# Patient Record
Sex: Female | Born: 1935
Health system: Southern US, Community
[De-identification: ages and names within clinical notes are randomized; demographics above are authoritative.]

## PROBLEM LIST (undated history)

## (undated) DIAGNOSIS — C801 Malignant (primary) neoplasm, unspecified: Secondary | ICD-10-CM

## (undated) HISTORY — PX: ABDOMINAL HYSTERECTOMY: SHX81

## (undated) HISTORY — PX: CHOLECYSTECTOMY: SHX55

## (undated) HISTORY — PX: PANCREATICODUODENECTOMY: SUR1000

---

## 1998-06-28 ENCOUNTER — Inpatient Hospital Stay (HOSPITAL_COMMUNITY): Admission: EM | Admit: 1998-06-28 | Discharge: 1998-07-02 | Payer: Self-pay | Admitting: Gastroenterology

## 1998-06-30 ENCOUNTER — Encounter: Payer: Self-pay | Admitting: Gastroenterology

## 2000-02-17 ENCOUNTER — Encounter: Admission: RE | Admit: 2000-02-17 | Discharge: 2000-02-17 | Payer: Self-pay | Admitting: Gastroenterology

## 2000-02-17 ENCOUNTER — Encounter: Payer: Self-pay | Admitting: Gastroenterology

## 2001-02-18 ENCOUNTER — Encounter: Payer: Self-pay | Admitting: Gastroenterology

## 2001-02-18 ENCOUNTER — Encounter: Admission: RE | Admit: 2001-02-18 | Discharge: 2001-02-18 | Payer: Self-pay | Admitting: Gastroenterology

## 2002-02-19 ENCOUNTER — Encounter: Payer: Self-pay | Admitting: Gastroenterology

## 2002-02-19 ENCOUNTER — Encounter: Admission: RE | Admit: 2002-02-19 | Discharge: 2002-02-19 | Payer: Self-pay | Admitting: Gastroenterology

## 2002-02-24 ENCOUNTER — Encounter: Admission: RE | Admit: 2002-02-24 | Discharge: 2002-02-24 | Payer: Self-pay | Admitting: Gastroenterology

## 2002-02-24 ENCOUNTER — Encounter: Payer: Self-pay | Admitting: Gastroenterology

## 2003-04-14 ENCOUNTER — Encounter: Admission: RE | Admit: 2003-04-14 | Discharge: 2003-04-14 | Payer: Self-pay | Admitting: Gastroenterology

## 2004-02-19 ENCOUNTER — Emergency Department (HOSPITAL_COMMUNITY): Admission: EM | Admit: 2004-02-19 | Discharge: 2004-02-19 | Payer: Self-pay | Admitting: Emergency Medicine

## 2004-04-14 ENCOUNTER — Encounter: Admission: RE | Admit: 2004-04-14 | Discharge: 2004-04-14 | Payer: Self-pay | Admitting: Gastroenterology

## 2005-02-03 ENCOUNTER — Inpatient Hospital Stay: Payer: Self-pay | Admitting: Unknown Physician Specialty

## 2005-02-04 ENCOUNTER — Other Ambulatory Visit: Payer: Self-pay

## 2005-03-30 ENCOUNTER — Encounter: Admission: RE | Admit: 2005-03-30 | Discharge: 2005-03-30 | Payer: Self-pay | Admitting: Gastroenterology

## 2005-05-01 ENCOUNTER — Encounter: Admission: RE | Admit: 2005-05-01 | Discharge: 2005-05-01 | Payer: Self-pay | Admitting: Gastroenterology

## 2006-04-04 ENCOUNTER — Encounter: Admission: RE | Admit: 2006-04-04 | Discharge: 2006-04-04 | Payer: Self-pay | Admitting: Gastroenterology

## 2006-04-09 ENCOUNTER — Encounter: Admission: RE | Admit: 2006-04-09 | Discharge: 2006-04-09 | Payer: Self-pay | Admitting: Gastroenterology

## 2006-04-13 ENCOUNTER — Ambulatory Visit (HOSPITAL_COMMUNITY): Admission: RE | Admit: 2006-04-13 | Discharge: 2006-04-13 | Payer: Self-pay | Admitting: Gastroenterology

## 2006-04-13 ENCOUNTER — Encounter (INDEPENDENT_AMBULATORY_CARE_PROVIDER_SITE_OTHER): Payer: Self-pay | Admitting: Specialist

## 2006-05-03 ENCOUNTER — Encounter: Admission: RE | Admit: 2006-05-03 | Discharge: 2006-05-03 | Payer: Self-pay | Admitting: Gastroenterology

## 2006-05-21 ENCOUNTER — Emergency Department (HOSPITAL_COMMUNITY): Admission: EM | Admit: 2006-05-21 | Discharge: 2006-05-21 | Payer: Self-pay | Admitting: Emergency Medicine

## 2006-05-27 ENCOUNTER — Ambulatory Visit: Payer: Self-pay | Admitting: Oncology

## 2006-05-28 ENCOUNTER — Ambulatory Visit: Admission: RE | Admit: 2006-05-28 | Discharge: 2006-08-18 | Payer: Self-pay | Admitting: *Deleted

## 2006-06-06 LAB — COMPREHENSIVE METABOLIC PANEL
ALT: 20 U/L (ref 0–35)
Albumin: 4.1 g/dL (ref 3.5–5.2)
Alkaline Phosphatase: 137 U/L — ABNORMAL HIGH (ref 39–117)
CO2: 26 mEq/L (ref 19–32)
Potassium: 4.7 mEq/L (ref 3.5–5.3)
Sodium: 139 mEq/L (ref 135–145)
Total Bilirubin: 0.8 mg/dL (ref 0.3–1.2)
Total Protein: 6.9 g/dL (ref 6.0–8.3)

## 2006-06-06 LAB — CBC WITH DIFFERENTIAL/PLATELET
BASO%: 1.2 % (ref 0.0–2.0)
LYMPH%: 36.2 % (ref 14.0–48.0)
MCHC: 32.2 g/dL (ref 32.0–36.0)
MONO#: 0.3 10*3/uL (ref 0.1–0.9)
NEUT#: 2.2 10*3/uL (ref 1.5–6.5)
Platelets: 257 10*3/uL (ref 145–400)
RBC: 3.92 10*6/uL (ref 3.70–5.32)
RDW: 13.6 % (ref 11.3–14.5)
WBC: 3.9 10*3/uL (ref 3.9–10.0)

## 2006-06-06 LAB — CANCER ANTIGEN 19-9: CA 19-9: 6.6 U/mL (ref <35.0)

## 2006-06-19 LAB — CBC WITH DIFFERENTIAL/PLATELET
Basophils Absolute: 0 10*3/uL (ref 0.0–0.1)
EOS%: 0.5 % (ref 0.0–7.0)
HCT: 37.5 % (ref 34.8–46.6)
HGB: 12.8 g/dL (ref 11.6–15.9)
MCH: 32.7 pg (ref 26.0–34.0)
MCV: 96 fL (ref 81.0–101.0)
NEUT%: 62.5 % (ref 39.6–76.8)
lymph#: 1.4 10*3/uL (ref 0.9–3.3)

## 2006-06-26 LAB — CBC WITH DIFFERENTIAL/PLATELET
Basophils Absolute: 0 10*3/uL (ref 0.0–0.1)
EOS%: 0.8 % (ref 0.0–7.0)
HGB: 11.6 g/dL (ref 11.6–15.9)
LYMPH%: 10.9 % — ABNORMAL LOW (ref 14.0–48.0)
MCH: 32.9 pg (ref 26.0–34.0)
MCV: 94.8 fL (ref 81.0–101.0)
MONO%: 4.7 % (ref 0.0–13.0)
NEUT%: 82.9 % — ABNORMAL HIGH (ref 39.6–76.8)
Platelets: 168 10*3/uL (ref 145–400)
RDW: 12.4 % (ref 11.3–14.5)

## 2006-06-26 LAB — COMPREHENSIVE METABOLIC PANEL
AST: 22 U/L (ref 0–37)
Alkaline Phosphatase: 117 U/L (ref 39–117)
BUN: 12 mg/dL (ref 6–23)
Creatinine, Ser: 0.78 mg/dL (ref 0.40–1.20)
Potassium: 3.8 mEq/L (ref 3.5–5.3)
Total Bilirubin: 0.5 mg/dL (ref 0.3–1.2)

## 2006-06-26 LAB — LACTATE DEHYDROGENASE: LDH: 135 U/L (ref 94–250)

## 2006-07-04 LAB — CBC WITH DIFFERENTIAL/PLATELET
Basophils Absolute: 0 10*3/uL (ref 0.0–0.1)
Eosinophils Absolute: 0.1 10*3/uL (ref 0.0–0.5)
HGB: 12 g/dL (ref 11.6–15.9)
MCV: 94.9 fL (ref 81.0–101.0)
MONO%: 9.1 % (ref 0.0–13.0)
NEUT#: 2.9 10*3/uL (ref 1.5–6.5)
RDW: 13.1 % (ref 11.3–14.5)
lymph#: 0.6 10*3/uL — ABNORMAL LOW (ref 0.9–3.3)

## 2006-07-04 LAB — COMPREHENSIVE METABOLIC PANEL
Albumin: 3.8 g/dL (ref 3.5–5.2)
CO2: 26 mEq/L (ref 19–32)
Calcium: 8.6 mg/dL (ref 8.4–10.5)
Glucose, Bld: 83 mg/dL (ref 70–99)
Potassium: 4 mEq/L (ref 3.5–5.3)
Sodium: 140 mEq/L (ref 135–145)
Total Protein: 6.3 g/dL (ref 6.0–8.3)

## 2006-07-04 LAB — LACTATE DEHYDROGENASE: LDH: 134 U/L (ref 94–250)

## 2006-07-10 LAB — CBC WITH DIFFERENTIAL/PLATELET
BASO%: 0.8 % (ref 0.0–2.0)
EOS%: 3.1 % (ref 0.0–7.0)
HCT: 33.3 % — ABNORMAL LOW (ref 34.8–46.6)
HGB: 12.1 g/dL (ref 11.6–15.9)
MCH: 34.4 pg — ABNORMAL HIGH (ref 26.0–34.0)
MCHC: 36.5 g/dL — ABNORMAL HIGH (ref 32.0–36.0)
MONO#: 0.3 10*3/uL (ref 0.1–0.9)
NEUT%: 70.7 % (ref 39.6–76.8)
RDW: 14.2 % (ref 11.3–14.5)
WBC: 2.9 10*3/uL — ABNORMAL LOW (ref 3.9–10.0)
lymph#: 0.4 10*3/uL — ABNORMAL LOW (ref 0.9–3.3)

## 2006-07-17 ENCOUNTER — Ambulatory Visit: Payer: Self-pay | Admitting: Oncology

## 2006-07-24 LAB — COMPREHENSIVE METABOLIC PANEL
AST: 73 U/L — ABNORMAL HIGH (ref 0–37)
Albumin: 3.6 g/dL (ref 3.5–5.2)
BUN: 13 mg/dL (ref 6–23)
CO2: 25 mEq/L (ref 19–32)
Calcium: 8.8 mg/dL (ref 8.4–10.5)
Chloride: 104 mEq/L (ref 96–112)
Creatinine, Ser: 0.82 mg/dL (ref 0.40–1.20)
Glucose, Bld: 100 mg/dL — ABNORMAL HIGH (ref 70–99)
Potassium: 3.9 mEq/L (ref 3.5–5.3)

## 2006-07-24 LAB — CBC WITH DIFFERENTIAL/PLATELET
Basophils Absolute: 0 10*3/uL (ref 0.0–0.1)
EOS%: 1.5 % (ref 0.0–7.0)
Eosinophils Absolute: 0.1 10*3/uL (ref 0.0–0.5)
HCT: 34.2 % — ABNORMAL LOW (ref 34.8–46.6)
HGB: 12 g/dL (ref 11.6–15.9)
MONO#: 0.5 10*3/uL (ref 0.1–0.9)
NEUT#: 3.2 10*3/uL (ref 1.5–6.5)
NEUT%: 74.2 % (ref 39.6–76.8)
RDW: 18.4 % — ABNORMAL HIGH (ref 11.3–14.5)
lymph#: 0.5 10*3/uL — ABNORMAL LOW (ref 0.9–3.3)

## 2006-07-24 LAB — LACTATE DEHYDROGENASE: LDH: 146 U/L (ref 94–250)

## 2006-08-02 ENCOUNTER — Ambulatory Visit (HOSPITAL_COMMUNITY): Admission: RE | Admit: 2006-08-02 | Discharge: 2006-08-02 | Payer: Self-pay | Admitting: *Deleted

## 2006-08-21 LAB — COMPREHENSIVE METABOLIC PANEL
AST: 33 U/L (ref 0–37)
Albumin: 3.9 g/dL (ref 3.5–5.2)
Alkaline Phosphatase: 265 U/L — ABNORMAL HIGH (ref 39–117)
BUN: 8 mg/dL (ref 6–23)
Calcium: 8.7 mg/dL (ref 8.4–10.5)
Chloride: 105 mEq/L (ref 96–112)
Glucose, Bld: 124 mg/dL — ABNORMAL HIGH (ref 70–99)
Potassium: 4.2 mEq/L (ref 3.5–5.3)
Sodium: 141 mEq/L (ref 135–145)
Total Protein: 6.7 g/dL (ref 6.0–8.3)

## 2006-08-21 LAB — CBC WITH DIFFERENTIAL/PLATELET
Basophils Absolute: 0 10*3/uL (ref 0.0–0.1)
EOS%: 1 % (ref 0.0–7.0)
Eosinophils Absolute: 0 10*3/uL (ref 0.0–0.5)
HGB: 11.6 g/dL (ref 11.6–15.9)
NEUT#: 1.7 10*3/uL (ref 1.5–6.5)
RBC: 3.37 10*6/uL — ABNORMAL LOW (ref 3.70–5.32)
RDW: 17 % — ABNORMAL HIGH (ref 11.3–14.5)
lymph#: 0.5 10*3/uL — ABNORMAL LOW (ref 0.9–3.3)

## 2006-08-21 LAB — CANCER ANTIGEN 19-9: CA 19-9: 10.8 U/mL (ref <35.0)

## 2006-10-12 ENCOUNTER — Ambulatory Visit: Payer: Self-pay | Admitting: Oncology

## 2006-10-16 LAB — COMPREHENSIVE METABOLIC PANEL
AST: 18 U/L (ref 0–37)
Albumin: 3.2 g/dL — ABNORMAL LOW (ref 3.5–5.2)
Alkaline Phosphatase: 174 U/L — ABNORMAL HIGH (ref 39–117)
BUN: 15 mg/dL (ref 6–23)
Potassium: 4.7 mEq/L (ref 3.5–5.3)
Sodium: 138 mEq/L (ref 135–145)
Total Protein: 6.8 g/dL (ref 6.0–8.3)

## 2006-10-16 LAB — CBC WITH DIFFERENTIAL/PLATELET
EOS%: 0.8 % (ref 0.0–7.0)
MCH: 31.3 pg (ref 26.0–34.0)
MCHC: 34.3 g/dL (ref 32.0–36.0)
MCV: 91.3 fL (ref 81.0–101.0)
MONO%: 12.2 % (ref 0.0–13.0)
RBC: 3.18 10*6/uL — ABNORMAL LOW (ref 3.70–5.32)
RDW: 14.2 % (ref 11.3–14.5)

## 2006-10-16 LAB — LACTATE DEHYDROGENASE: LDH: 112 U/L (ref 94–250)

## 2006-10-23 ENCOUNTER — Emergency Department (HOSPITAL_COMMUNITY): Admission: EM | Admit: 2006-10-23 | Discharge: 2006-10-23 | Payer: Self-pay | Admitting: Emergency Medicine

## 2006-11-22 LAB — CBC WITH DIFFERENTIAL/PLATELET
BASO%: 0.4 % (ref 0.0–2.0)
Basophils Absolute: 0 10*3/uL (ref 0.0–0.1)
EOS%: 0.7 % (ref 0.0–7.0)
Eosinophils Absolute: 0 10*3/uL (ref 0.0–0.5)
HCT: 31.8 % — ABNORMAL LOW (ref 34.8–46.6)
HGB: 10.9 g/dL — ABNORMAL LOW (ref 11.6–15.9)
LYMPH%: 19.9 % (ref 14.0–48.0)
MCH: 30.3 pg (ref 26.0–34.0)
MCHC: 34.2 g/dL (ref 32.0–36.0)
MCV: 88.4 fL (ref 81.0–101.0)
MONO#: 0.3 10*3/uL (ref 0.1–0.9)
MONO%: 7.3 % (ref 0.0–13.0)
NEUT#: 3.4 10*3/uL (ref 1.5–6.5)
NEUT%: 71.7 % (ref 39.6–76.8)
Platelets: 231 10*3/uL (ref 145–400)
RBC: 3.6 10*6/uL — ABNORMAL LOW (ref 3.70–5.32)
RDW: 15.4 % — ABNORMAL HIGH (ref 11.3–14.5)
WBC: 4.8 10*3/uL (ref 3.9–10.0)
lymph#: 0.9 10*3/uL (ref 0.9–3.3)

## 2006-11-22 LAB — COMPREHENSIVE METABOLIC PANEL
ALT: 22 U/L (ref 0–35)
BUN: 12 mg/dL (ref 6–23)
CO2: 26 mEq/L (ref 19–32)
Calcium: 9.8 mg/dL (ref 8.4–10.5)
Chloride: 104 mEq/L (ref 96–112)
Creatinine, Ser: 0.67 mg/dL (ref 0.40–1.20)
Glucose, Bld: 99 mg/dL (ref 70–99)
Total Bilirubin: 0.3 mg/dL (ref 0.3–1.2)

## 2006-11-22 LAB — LACTATE DEHYDROGENASE: LDH: 127 U/L (ref 94–250)

## 2006-12-31 ENCOUNTER — Ambulatory Visit: Payer: Self-pay | Admitting: Oncology

## 2007-01-03 LAB — CBC WITH DIFFERENTIAL/PLATELET
BASO%: 0.4 % (ref 0.0–2.0)
Basophils Absolute: 0 10*3/uL (ref 0.0–0.1)
EOS%: 0.8 % (ref 0.0–7.0)
HCT: 30.2 % — ABNORMAL LOW (ref 34.8–46.6)
HGB: 10.4 g/dL — ABNORMAL LOW (ref 11.6–15.9)
MCH: 30.1 pg (ref 26.0–34.0)
MONO#: 0.4 10*3/uL (ref 0.1–0.9)
NEUT%: 69.1 % (ref 39.6–76.8)
RDW: 14.9 % — ABNORMAL HIGH (ref 11.3–14.5)
WBC: 4.9 10*3/uL (ref 3.9–10.0)
lymph#: 1 10*3/uL (ref 0.9–3.3)

## 2007-01-03 LAB — CANCER ANTIGEN 19-9: CA 19-9: <1.2 U/mL (ref <35.0)

## 2007-01-03 LAB — COMPREHENSIVE METABOLIC PANEL
ALT: 20 U/L (ref 0–35)
AST: 25 U/L (ref 0–37)
Albumin: 3.4 g/dL — ABNORMAL LOW (ref 3.5–5.2)
BUN: 10 mg/dL (ref 6–23)
CO2: 24 mEq/L (ref 19–32)
Calcium: 8.5 mg/dL (ref 8.4–10.5)
Chloride: 105 mEq/L (ref 96–112)
Creatinine, Ser: 0.75 mg/dL (ref 0.40–1.20)
Potassium: 4 mEq/L (ref 3.5–5.3)

## 2007-01-03 LAB — LACTATE DEHYDROGENASE: LDH: 120 U/L (ref 94–250)

## 2007-01-15 ENCOUNTER — Ambulatory Visit (HOSPITAL_COMMUNITY): Admission: RE | Admit: 2007-01-15 | Discharge: 2007-01-15 | Payer: Self-pay | Admitting: Oncology

## 2007-01-30 ENCOUNTER — Ambulatory Visit (HOSPITAL_COMMUNITY): Admission: RE | Admit: 2007-01-30 | Discharge: 2007-01-30 | Payer: Self-pay | Admitting: Oncology

## 2007-02-22 ENCOUNTER — Encounter: Admission: RE | Admit: 2007-02-22 | Discharge: 2007-02-22 | Payer: Self-pay | Admitting: Gastroenterology

## 2007-02-26 ENCOUNTER — Ambulatory Visit: Payer: Self-pay | Admitting: Oncology

## 2007-03-01 LAB — CBC WITH DIFFERENTIAL/PLATELET
Basophils Absolute: 0 10*3/uL (ref 0.0–0.1)
Eosinophils Absolute: 0 10*3/uL (ref 0.0–0.5)
HCT: 32 % — ABNORMAL LOW (ref 34.8–46.6)
HGB: 11.1 g/dL — ABNORMAL LOW (ref 11.6–15.9)
MONO#: 0.3 10*3/uL (ref 0.1–0.9)
NEUT%: 75.3 % (ref 39.6–76.8)
Platelets: 226 10*3/uL (ref 145–400)
WBC: 4.6 10*3/uL (ref 3.9–10.0)
lymph#: 0.8 10*3/uL — ABNORMAL LOW (ref 0.9–3.3)

## 2007-03-02 LAB — LACTATE DEHYDROGENASE: LDH: 130 U/L (ref 94–250)

## 2007-03-02 LAB — COMPREHENSIVE METABOLIC PANEL
ALT: 16 U/L (ref 0–35)
BUN: 16 mg/dL (ref 6–23)
CO2: 24 mEq/L (ref 19–32)
Calcium: 8.8 mg/dL (ref 8.4–10.5)
Chloride: 105 mEq/L (ref 96–112)
Creatinine, Ser: 0.81 mg/dL (ref 0.40–1.20)
Glucose, Bld: 111 mg/dL — ABNORMAL HIGH (ref 70–99)

## 2007-03-02 LAB — CEA: CEA: 2.9 ng/mL (ref 0.0–5.0)

## 2007-03-05 ENCOUNTER — Observation Stay (HOSPITAL_COMMUNITY): Admission: RE | Admit: 2007-03-05 | Discharge: 2007-03-05 | Payer: Self-pay | Admitting: Neurosurgery

## 2007-03-05 ENCOUNTER — Encounter (INDEPENDENT_AMBULATORY_CARE_PROVIDER_SITE_OTHER): Payer: Self-pay | Admitting: Neurosurgery

## 2007-03-08 ENCOUNTER — Inpatient Hospital Stay (HOSPITAL_COMMUNITY): Admission: EM | Admit: 2007-03-08 | Discharge: 2007-03-14 | Payer: Self-pay | Admitting: Emergency Medicine

## 2007-03-28 LAB — COMPREHENSIVE METABOLIC PANEL
AST: 26 U/L (ref 0–37)
Albumin: 3.7 g/dL (ref 3.5–5.2)
BUN: 19 mg/dL (ref 6–23)
CO2: 24 mEq/L (ref 19–32)
Calcium: 9 mg/dL (ref 8.4–10.5)
Chloride: 102 mEq/L (ref 96–112)
Creatinine, Ser: 0.78 mg/dL (ref 0.40–1.20)
Glucose, Bld: 104 mg/dL — ABNORMAL HIGH (ref 70–99)
Potassium: 4.6 mEq/L (ref 3.5–5.3)

## 2007-03-28 LAB — CBC WITH DIFFERENTIAL/PLATELET
Basophils Absolute: 0 10*3/uL (ref 0.0–0.1)
Eosinophils Absolute: 0 10*3/uL (ref 0.0–0.5)
HCT: 30.7 % — ABNORMAL LOW (ref 34.8–46.6)
HGB: 10.6 g/dL — ABNORMAL LOW (ref 11.6–15.9)
MCH: 29.8 pg (ref 26.0–34.0)
NEUT#: 2.7 10*3/uL (ref 1.5–6.5)
NEUT%: 72.1 % (ref 39.6–76.8)
RDW: 15.5 % — ABNORMAL HIGH (ref 11.3–14.5)
lymph#: 0.8 10*3/uL — ABNORMAL LOW (ref 0.9–3.3)

## 2007-03-28 LAB — CANCER ANTIGEN 19-9: CA 19-9: 2.5 U/mL (ref <35.0)

## 2007-03-28 LAB — LACTATE DEHYDROGENASE: LDH: 137 U/L (ref 94–250)

## 2007-03-28 LAB — CEA: CEA: 3.5 ng/mL (ref 0.0–5.0)

## 2007-04-22 ENCOUNTER — Ambulatory Visit: Payer: Self-pay | Admitting: Oncology

## 2007-04-29 LAB — COMPREHENSIVE METABOLIC PANEL
AST: 23 U/L (ref 0–37)
Albumin: 3.9 g/dL (ref 3.5–5.2)
BUN: 18 mg/dL (ref 6–23)
Calcium: 9.4 mg/dL (ref 8.4–10.5)
Chloride: 103 mEq/L (ref 96–112)
Glucose, Bld: 97 mg/dL (ref 70–99)
Potassium: 4.4 mEq/L (ref 3.5–5.3)

## 2007-04-29 LAB — CBC WITH DIFFERENTIAL/PLATELET
Basophils Absolute: 0 10*3/uL (ref 0.0–0.1)
EOS%: 0.9 % (ref 0.0–7.0)
Eosinophils Absolute: 0 10*3/uL (ref 0.0–0.5)
HGB: 10.3 g/dL — ABNORMAL LOW (ref 11.6–15.9)
NEUT#: 4.4 10*3/uL (ref 1.5–6.5)
RDW: 14.9 % — ABNORMAL HIGH (ref 11.3–14.5)
WBC: 5.7 10*3/uL (ref 3.9–10.0)
lymph#: 0.9 10*3/uL (ref 0.9–3.3)

## 2007-04-29 LAB — CANCER ANTIGEN 19-9: CA 19-9: 11.7 U/mL (ref <35.0)

## 2007-05-06 ENCOUNTER — Ambulatory Visit (HOSPITAL_COMMUNITY): Admission: RE | Admit: 2007-05-06 | Discharge: 2007-05-06 | Payer: Self-pay | Admitting: Oncology

## 2007-05-07 ENCOUNTER — Encounter: Admission: RE | Admit: 2007-05-07 | Discharge: 2007-05-07 | Payer: Self-pay | Admitting: Gastroenterology

## 2007-05-17 ENCOUNTER — Encounter: Admission: RE | Admit: 2007-05-17 | Discharge: 2007-05-17 | Payer: Self-pay | Admitting: Gastroenterology

## 2007-06-04 ENCOUNTER — Ambulatory Visit: Payer: Self-pay | Admitting: Oncology

## 2007-06-06 LAB — CBC WITH DIFFERENTIAL/PLATELET
BASO%: 0.6 % (ref 0.0–2.0)
EOS%: 0.9 % (ref 0.0–7.0)
HCT: 30.2 % — ABNORMAL LOW (ref 34.8–46.6)
LYMPH%: 18.9 % (ref 14.0–48.0)
MCH: 29.2 pg (ref 26.0–34.0)
MCHC: 34.2 g/dL (ref 32.0–36.0)
MCV: 85.4 fL (ref 81.0–101.0)
MONO%: 8.1 % (ref 0.0–13.0)
NEUT%: 71.5 % (ref 39.6–76.8)
lymph#: 0.7 10*3/uL — ABNORMAL LOW (ref 0.9–3.3)

## 2007-06-08 LAB — LACTATE DEHYDROGENASE: LDH: 122 U/L (ref 94–250)

## 2007-06-08 LAB — VITAMIN B12: Vitamin B-12: 257 pg/mL (ref 211–911)

## 2007-06-08 LAB — COMPREHENSIVE METABOLIC PANEL
ALT: 22 U/L (ref 0–35)
Albumin: 3.8 g/dL (ref 3.5–5.2)
CO2: 27 mEq/L (ref 19–32)
Chloride: 103 mEq/L (ref 96–112)
Potassium: 4.4 mEq/L (ref 3.5–5.3)
Sodium: 139 mEq/L (ref 135–145)
Total Bilirubin: 0.3 mg/dL (ref 0.3–1.2)
Total Protein: 6.7 g/dL (ref 6.0–8.3)

## 2007-06-08 LAB — IRON AND TIBC: UIBC: 300 ug/dL

## 2007-06-08 LAB — FERRITIN: Ferritin: 27 ng/mL (ref 10–291)

## 2007-08-01 ENCOUNTER — Ambulatory Visit: Payer: Self-pay | Admitting: Oncology

## 2007-08-05 LAB — CBC WITH DIFFERENTIAL/PLATELET
BASO%: 0.2 % (ref 0.0–2.0)
EOS%: 0.9 % (ref 0.0–7.0)
HCT: 33.8 % — ABNORMAL LOW (ref 34.8–46.6)
HGB: 11.5 g/dL — ABNORMAL LOW (ref 11.6–15.9)
MCH: 28.8 pg (ref 26.0–34.0)
MCHC: 34.1 g/dL (ref 32.0–36.0)
MONO#: 0.3 10*3/uL (ref 0.1–0.9)
NEUT%: 68.7 % (ref 39.6–76.8)
RDW: 14.8 % — ABNORMAL HIGH (ref 11.3–14.5)
WBC: 4 10*3/uL (ref 3.9–10.0)
lymph#: 0.9 10*3/uL (ref 0.9–3.3)

## 2007-08-07 LAB — LACTATE DEHYDROGENASE: LDH: 120 U/L (ref 94–250)

## 2007-08-07 LAB — COMPREHENSIVE METABOLIC PANEL
ALT: 16 U/L (ref 0–35)
AST: 25 U/L (ref 0–37)
Albumin: 4.2 g/dL (ref 3.5–5.2)
CO2: 23 mEq/L (ref 19–32)
Calcium: 9.2 mg/dL (ref 8.4–10.5)
Chloride: 105 mEq/L (ref 96–112)
Potassium: 4.5 mEq/L (ref 3.5–5.3)
Total Protein: 7.2 g/dL (ref 6.0–8.3)

## 2007-08-07 LAB — VITAMIN B12: Vitamin B-12: 292 pg/mL (ref 211–911)

## 2007-08-07 LAB — IRON AND TIBC
%SAT: 15 % — ABNORMAL LOW (ref 20–55)
Iron: 59 ug/dL (ref 42–145)

## 2007-08-07 LAB — FERRITIN: Ferritin: 18 ng/mL (ref 10–291)

## 2007-08-07 LAB — TRANSFERRIN RECEPTOR, SOLUABLE: Transferrin Receptor, Soluble: 16.9 nmol/L

## 2007-09-25 ENCOUNTER — Ambulatory Visit: Payer: Self-pay | Admitting: Oncology

## 2007-09-30 LAB — CBC WITH DIFFERENTIAL/PLATELET
Basophils Absolute: 0.1 10*3/uL (ref 0.0–0.1)
Eosinophils Absolute: 0 10*3/uL (ref 0.0–0.5)
HGB: 11.6 g/dL (ref 11.6–15.9)
NEUT#: 2.2 10*3/uL (ref 1.5–6.5)
RDW: 14.2 % (ref 11.3–14.5)
lymph#: 1.1 10*3/uL (ref 0.9–3.3)

## 2007-10-02 LAB — COMPREHENSIVE METABOLIC PANEL
AST: 20 U/L (ref 0–37)
Albumin: 4 g/dL (ref 3.5–5.2)
BUN: 9 mg/dL (ref 6–23)
Calcium: 8.9 mg/dL (ref 8.4–10.5)
Chloride: 109 mEq/L (ref 96–112)
Glucose, Bld: 96 mg/dL (ref 70–99)
Potassium: 4.3 mEq/L (ref 3.5–5.3)

## 2007-10-02 LAB — IRON AND TIBC
Iron: 63 ug/dL (ref 42–145)
TIBC: 375 ug/dL (ref 250–470)

## 2007-10-02 LAB — LACTATE DEHYDROGENASE: LDH: 134 U/L (ref 94–250)

## 2007-10-02 LAB — FERRITIN: Ferritin: 20 ng/mL (ref 10–291)

## 2007-11-18 ENCOUNTER — Encounter: Admission: RE | Admit: 2007-11-18 | Discharge: 2007-11-18 | Payer: Self-pay | Admitting: Gastroenterology

## 2007-11-27 ENCOUNTER — Ambulatory Visit (HOSPITAL_COMMUNITY): Admission: RE | Admit: 2007-11-27 | Discharge: 2007-11-27 | Payer: Self-pay | Admitting: Oncology

## 2007-12-02 ENCOUNTER — Ambulatory Visit: Payer: Self-pay | Admitting: Oncology

## 2007-12-05 LAB — CBC WITH DIFFERENTIAL/PLATELET
BASO%: 0.7 % (ref 0.0–2.0)
EOS%: 1.1 % (ref 0.0–7.0)
HCT: 34.1 % — ABNORMAL LOW (ref 34.8–46.6)
LYMPH%: 29.1 % (ref 14.0–48.0)
MCH: 31 pg (ref 26.0–34.0)
MCHC: 34.8 g/dL (ref 32.0–36.0)
NEUT%: 61.6 % (ref 39.6–76.8)
Platelets: 175 10*3/uL (ref 145–400)
RBC: 3.83 10*6/uL (ref 3.70–5.32)

## 2007-12-07 LAB — VITAMIN B12: Vitamin B-12: 229 pg/mL (ref 211–911)

## 2007-12-07 LAB — LACTATE DEHYDROGENASE: LDH: 133 U/L (ref 94–250)

## 2007-12-07 LAB — COMPREHENSIVE METABOLIC PANEL
ALT: 20 U/L (ref 0–35)
AST: 23 U/L (ref 0–37)
Alkaline Phosphatase: 185 U/L — ABNORMAL HIGH (ref 39–117)
Creatinine, Ser: 0.76 mg/dL (ref 0.40–1.20)
Sodium: 139 mEq/L (ref 135–145)
Total Bilirubin: 0.4 mg/dL (ref 0.3–1.2)
Total Protein: 6.8 g/dL (ref 6.0–8.3)

## 2007-12-07 LAB — CEA: CEA: 4.1 ng/mL (ref 0.0–5.0)

## 2007-12-07 LAB — TRANSFERRIN RECEPTOR, SOLUABLE: Transferrin Receptor, Soluble: 12.7 nmol/L

## 2007-12-30 ENCOUNTER — Encounter: Admission: RE | Admit: 2007-12-30 | Discharge: 2007-12-30 | Payer: Self-pay | Admitting: Gastroenterology

## 2008-01-09 ENCOUNTER — Encounter: Admission: RE | Admit: 2008-01-09 | Discharge: 2008-01-09 | Payer: Self-pay | Admitting: Gastroenterology

## 2008-01-29 ENCOUNTER — Encounter: Admission: RE | Admit: 2008-01-29 | Discharge: 2008-01-29 | Payer: Self-pay | Admitting: Neurosurgery

## 2008-01-31 ENCOUNTER — Ambulatory Visit: Payer: Self-pay | Admitting: Oncology

## 2008-02-06 LAB — CBC WITH DIFFERENTIAL/PLATELET
BASO%: 0.6 % (ref 0.0–2.0)
Basophils Absolute: 0 10*3/uL (ref 0.0–0.1)
EOS%: 0.5 % (ref 0.0–7.0)
MCH: 31.9 pg (ref 26.0–34.0)
MCHC: 34 g/dL (ref 32.0–36.0)
MCV: 93.7 fL (ref 81.0–101.0)
MONO%: 7.8 % (ref 0.0–13.0)
RBC: 3.82 10*6/uL (ref 3.70–5.32)
RDW: 13.4 % (ref 11.3–14.5)
lymph#: 1 10*3/uL (ref 0.9–3.3)

## 2008-02-10 LAB — COMPREHENSIVE METABOLIC PANEL
ALT: 17 U/L (ref 0–35)
AST: 19 U/L (ref 0–37)
Albumin: 4.1 g/dL (ref 3.5–5.2)
Alkaline Phosphatase: 164 U/L — ABNORMAL HIGH (ref 39–117)
BUN: 11 mg/dL (ref 6–23)
Calcium: 9.1 mg/dL (ref 8.4–10.5)
Chloride: 106 mEq/L (ref 96–112)
Potassium: 4.5 mEq/L (ref 3.5–5.3)

## 2008-02-10 LAB — VITAMIN B12: Vitamin B-12: 311 pg/mL (ref 211–911)

## 2008-02-10 LAB — IRON AND TIBC
%SAT: 27 % (ref 20–55)
Iron: 106 ug/dL (ref 42–145)
TIBC: 392 ug/dL (ref 250–470)

## 2008-02-10 LAB — CANCER ANTIGEN 19-9: CA 19-9: 11.3 U/mL (ref <35.0)

## 2008-02-10 LAB — TRANSFERRIN RECEPTOR, SOLUABLE: Transferrin Receptor, Soluble: 13.3 nmol/L

## 2008-03-30 ENCOUNTER — Ambulatory Visit: Payer: Self-pay | Admitting: Oncology

## 2008-04-01 LAB — CBC WITH DIFFERENTIAL/PLATELET
BASO%: 0.5 % (ref 0.0–2.0)
EOS%: 0.6 % (ref 0.0–7.0)
LYMPH%: 23.3 % (ref 14.0–48.0)
MCH: 32.2 pg (ref 26.0–34.0)
MCHC: 33.9 g/dL (ref 32.0–36.0)
MONO#: 0.3 10*3/uL (ref 0.1–0.9)
Platelets: 174 10*3/uL (ref 145–400)
RBC: 3.71 10*6/uL (ref 3.70–5.32)
WBC: 4.3 10*3/uL (ref 3.9–10.0)
lymph#: 1 10*3/uL (ref 0.9–3.3)

## 2008-04-01 LAB — COMPREHENSIVE METABOLIC PANEL
ALT: 15 U/L (ref 0–35)
AST: 18 U/L (ref 0–37)
Alkaline Phosphatase: 148 U/L — ABNORMAL HIGH (ref 39–117)
CO2: 23 mEq/L (ref 19–32)
Sodium: 139 mEq/L (ref 135–145)
Total Bilirubin: 0.3 mg/dL (ref 0.3–1.2)
Total Protein: 6.9 g/dL (ref 6.0–8.3)

## 2008-04-01 LAB — CANCER ANTIGEN 19-9: CA 19-9: 9.3 U/mL (ref <35.0)

## 2008-04-01 LAB — VITAMIN B12: Vitamin B-12: >2000 pg/mL — ABNORMAL HIGH (ref 211–911)

## 2008-04-01 LAB — IRON AND TIBC: UIBC: 290 ug/dL

## 2008-04-01 LAB — LACTATE DEHYDROGENASE: LDH: 148 U/L (ref 94–250)

## 2008-05-28 ENCOUNTER — Ambulatory Visit: Payer: Self-pay | Admitting: Oncology

## 2008-06-09 LAB — CBC WITH DIFFERENTIAL/PLATELET
Basophils Absolute: 0 10*3/uL (ref 0.0–0.1)
EOS%: 0.5 % (ref 0.0–7.0)
HCT: 35.5 % (ref 34.8–46.6)
HGB: 12.3 g/dL (ref 11.6–15.9)
MCH: 32.5 pg (ref 26.0–34.0)
MCV: 94.2 fL (ref 81.0–101.0)
MONO%: 6 % (ref 0.0–13.0)
NEUT%: 69 % (ref 39.6–76.8)

## 2008-06-09 LAB — CEA: CEA: 5.4 ng/mL — ABNORMAL HIGH (ref 0.0–5.0)

## 2008-06-09 LAB — COMPREHENSIVE METABOLIC PANEL
AST: 18 U/L (ref 0–37)
Alkaline Phosphatase: 142 U/L — ABNORMAL HIGH (ref 39–117)
BUN: 10 mg/dL (ref 6–23)
Calcium: 9.2 mg/dL (ref 8.4–10.5)
Creatinine, Ser: 0.9 mg/dL (ref 0.40–1.20)

## 2008-06-09 LAB — VITAMIN B12: Vitamin B-12: 396 pg/mL (ref 211–911)

## 2008-06-10 ENCOUNTER — Encounter: Admission: RE | Admit: 2008-06-10 | Discharge: 2008-06-10 | Payer: Self-pay | Admitting: Gastroenterology

## 2008-07-10 LAB — CEA: CEA: 4.1 ng/mL (ref 0.0–5.0)

## 2008-09-03 ENCOUNTER — Ambulatory Visit: Payer: Self-pay | Admitting: Oncology

## 2008-09-07 LAB — VITAMIN B12: Vitamin B-12: 352 pg/mL (ref 211–911)

## 2008-09-07 LAB — IRON AND TIBC
Iron: 10 ug/dL — ABNORMAL LOW (ref 42–145)
UIBC: 267 ug/dL

## 2008-09-07 LAB — COMPREHENSIVE METABOLIC PANEL
Alkaline Phosphatase: 103 U/L (ref 39–117)
Glucose, Bld: 103 mg/dL — ABNORMAL HIGH (ref 70–99)
Sodium: 137 mEq/L (ref 135–145)
Total Bilirubin: 0.4 mg/dL (ref 0.3–1.2)
Total Protein: 6.2 g/dL (ref 6.0–8.3)

## 2008-09-07 LAB — CBC WITH DIFFERENTIAL/PLATELET
BASO%: 0.2 % (ref 0.0–2.0)
EOS%: 0.2 % (ref 0.0–7.0)
HCT: 30.9 % — ABNORMAL LOW (ref 34.8–46.6)
MCH: 32 pg (ref 25.1–34.0)
MCHC: 34.1 g/dL (ref 31.5–36.0)
MONO%: 6.5 % (ref 0.0–14.0)
NEUT%: 85.7 % — ABNORMAL HIGH (ref 38.4–76.8)
RDW: 12.7 % (ref 11.2–14.5)
lymph#: 0.6 10*3/uL — ABNORMAL LOW (ref 0.9–3.3)

## 2008-09-07 LAB — CANCER ANTIGEN 19-9: CA 19-9: 12.4 U/mL (ref <35.0)

## 2008-10-29 ENCOUNTER — Ambulatory Visit: Payer: Self-pay | Admitting: Oncology

## 2008-12-01 ENCOUNTER — Ambulatory Visit: Payer: Self-pay | Admitting: Oncology

## 2008-12-01 ENCOUNTER — Ambulatory Visit (HOSPITAL_COMMUNITY): Admission: RE | Admit: 2008-12-01 | Discharge: 2008-12-01 | Payer: Self-pay | Admitting: Oncology

## 2008-12-01 LAB — COMPREHENSIVE METABOLIC PANEL
ALT: 22 U/L (ref 0–35)
AST: 26 U/L (ref 0–37)
Alkaline Phosphatase: 108 U/L (ref 39–117)
Sodium: 138 mEq/L (ref 135–145)
Total Bilirubin: 0.5 mg/dL (ref 0.3–1.2)
Total Protein: 7.3 g/dL (ref 6.0–8.3)

## 2008-12-01 LAB — CBC WITH DIFFERENTIAL/PLATELET
BASO%: 0.6 % (ref 0.0–2.0)
LYMPH%: 23.1 % (ref 14.0–49.7)
MCHC: 34.2 g/dL (ref 31.5–36.0)
MCV: 93.5 fL (ref 79.5–101.0)
MONO#: 0.3 10*3/uL (ref 0.1–0.9)
MONO%: 6.1 % (ref 0.0–14.0)
Platelets: 173 10*3/uL (ref 145–400)
RBC: 3.93 10*6/uL (ref 3.70–5.45)
WBC: 4.4 10*3/uL (ref 3.9–10.3)

## 2008-12-01 LAB — IRON AND TIBC
%SAT: 21 % (ref 20–55)
Iron: 81 ug/dL (ref 42–145)
UIBC: 300 ug/dL

## 2008-12-01 LAB — VITAMIN B12: Vitamin B-12: 452 pg/mL (ref 211–911)

## 2008-12-31 ENCOUNTER — Ambulatory Visit: Payer: Self-pay | Admitting: Oncology

## 2009-01-04 LAB — CBC WITH DIFFERENTIAL/PLATELET
Basophils Absolute: 0 10*3/uL (ref 0.0–0.1)
EOS%: 0.4 % (ref 0.0–7.0)
HGB: 12.7 g/dL (ref 11.6–15.9)
MCH: 31 pg (ref 25.1–34.0)
MCHC: 33.7 g/dL (ref 31.5–36.0)
MCV: 92 fL (ref 79.5–101.0)
MONO%: 7.1 % (ref 0.0–14.0)
RDW: 12.7 % (ref 11.2–14.5)

## 2009-01-28 ENCOUNTER — Ambulatory Visit: Payer: Self-pay | Admitting: Oncology

## 2009-02-05 LAB — CBC WITH DIFFERENTIAL/PLATELET
BASO%: 0.9 % (ref 0.0–2.0)
Basophils Absolute: 0 10*3/uL (ref 0.0–0.1)
EOS%: 0.6 % (ref 0.0–7.0)
Eosinophils Absolute: 0 10*3/uL (ref 0.0–0.5)
HCT: 37.8 % (ref 34.8–46.6)
HGB: 12.9 g/dL (ref 11.6–15.9)
LYMPH%: 28.7 % (ref 14.0–49.7)
MCH: 31.2 pg (ref 25.1–34.0)
MCHC: 34.1 g/dL (ref 31.5–36.0)
MCV: 91.3 fL (ref 79.5–101.0)
MONO#: 0.4 10*3/uL (ref 0.1–0.9)
MONO%: 7.9 % (ref 0.0–14.0)
NEUT#: 2.9 10*3/uL (ref 1.5–6.5)
NEUT%: 61.9 % (ref 38.4–76.8)
Platelets: 164 10*3/uL (ref 145–400)
RBC: 4.14 10*6/uL (ref 3.70–5.45)
RDW: 12.7 % (ref 11.2–14.5)
WBC: 4.7 10*3/uL (ref 3.9–10.3)
lymph#: 1.3 10*3/uL (ref 0.9–3.3)
nRBC: 0 % (ref 0–0)

## 2009-03-04 ENCOUNTER — Ambulatory Visit: Payer: Self-pay | Admitting: Oncology

## 2009-03-10 LAB — CBC WITH DIFFERENTIAL/PLATELET
BASO%: 0.2 % (ref 0.0–2.0)
Basophils Absolute: 0 10*3/uL (ref 0.0–0.1)
EOS%: 0.5 % (ref 0.0–7.0)
HGB: 12.1 g/dL (ref 11.6–15.9)
MCH: 31.9 pg (ref 25.1–34.0)
MCHC: 33.9 g/dL (ref 31.5–36.0)
MCV: 94.1 fL (ref 79.5–101.0)
MONO%: 5 % (ref 0.0–14.0)
NEUT%: 78.1 % — ABNORMAL HIGH (ref 38.4–76.8)
RDW: 13.6 % (ref 11.2–14.5)
lymph#: 1.1 10*3/uL (ref 0.9–3.3)

## 2009-03-11 LAB — IRON AND TIBC
%SAT: 12 % — ABNORMAL LOW (ref 20–55)
TIBC: 335 ug/dL (ref 250–470)
UIBC: 296 ug/dL

## 2009-03-11 LAB — COMPREHENSIVE METABOLIC PANEL
ALT: 11 U/L (ref 0–35)
AST: 18 U/L (ref 0–37)
Alkaline Phosphatase: 112 U/L (ref 39–117)
BUN: 8 mg/dL (ref 6–23)
Creatinine, Ser: 0.81 mg/dL (ref 0.40–1.20)
Potassium: 4.5 mEq/L (ref 3.5–5.3)

## 2009-03-11 LAB — FERRITIN: Ferritin: 46 ng/mL (ref 10–291)

## 2009-03-11 LAB — CANCER ANTIGEN 19-9: CA 19-9: 19.8 U/mL (ref <35.0)

## 2009-04-05 ENCOUNTER — Ambulatory Visit: Payer: Self-pay | Admitting: Oncology

## 2009-04-05 LAB — CBC WITH DIFFERENTIAL/PLATELET
BASO%: 0.4 % (ref 0.0–2.0)
Basophils Absolute: 0 10*3/uL (ref 0.0–0.1)
EOS%: 0.6 % (ref 0.0–7.0)
HCT: 35 % (ref 34.8–46.6)
HGB: 11.9 g/dL (ref 11.6–15.9)
LYMPH%: 25.7 % (ref 14.0–49.7)
MCH: 32.4 pg (ref 25.1–34.0)
MCHC: 34.2 g/dL (ref 31.5–36.0)
MCV: 94.8 fL (ref 79.5–101.0)
NEUT%: 66.6 % (ref 38.4–76.8)
Platelets: 161 10*3/uL (ref 145–400)
lymph#: 1.1 10*3/uL (ref 0.9–3.3)

## 2009-05-05 ENCOUNTER — Ambulatory Visit: Payer: Self-pay | Admitting: Oncology

## 2009-05-05 LAB — CANCER ANTIGEN 19-9: CA 19-9: 14.4 U/mL (ref <35.0)

## 2009-07-15 ENCOUNTER — Ambulatory Visit: Payer: Self-pay | Admitting: Oncology

## 2009-07-20 LAB — CBC WITH DIFFERENTIAL/PLATELET
BASO%: 0.4 % (ref 0.0–2.0)
Basophils Absolute: 0 10*3/uL (ref 0.0–0.1)
Eosinophils Absolute: 0 10*3/uL (ref 0.0–0.5)
HCT: 34.9 % (ref 34.8–46.6)
HGB: 11.9 g/dL (ref 11.6–15.9)
LYMPH%: 25.5 % (ref 14.0–49.7)
MCHC: 34.2 g/dL (ref 31.5–36.0)
MONO#: 0.3 10*3/uL (ref 0.1–0.9)
NEUT%: 68.5 % (ref 38.4–76.8)
Platelets: 203 10*3/uL (ref 145–400)
WBC: 5.2 10*3/uL (ref 3.9–10.3)
lymph#: 1.3 10*3/uL (ref 0.9–3.3)

## 2009-07-22 LAB — COMPREHENSIVE METABOLIC PANEL
BUN: 11 mg/dL (ref 6–23)
CO2: 23 mEq/L (ref 19–32)
Calcium: 9.1 mg/dL (ref 8.4–10.5)
Chloride: 101 mEq/L (ref 96–112)
Creatinine, Ser: 0.76 mg/dL (ref 0.40–1.20)
Glucose, Bld: 105 mg/dL — ABNORMAL HIGH (ref 70–99)
Total Bilirubin: 0.3 mg/dL (ref 0.3–1.2)

## 2009-07-22 LAB — IRON AND TIBC
Iron: 36 ug/dL — ABNORMAL LOW (ref 42–145)
UIBC: 336 ug/dL

## 2009-07-22 LAB — CEA: CEA: 4.1 ng/mL (ref 0.0–5.0)

## 2009-07-22 LAB — TRANSFERRIN RECEPTOR, SOLUABLE: Transferrin Receptor, Soluble: 13.4 nmol/L

## 2009-07-22 LAB — CANCER ANTIGEN 19-9: CA 19-9: 18.2 U/mL (ref <35.0)

## 2009-07-22 LAB — VITAMIN B12: Vitamin B-12: 619 pg/mL (ref 211–911)

## 2009-07-22 LAB — LACTATE DEHYDROGENASE: LDH: 115 U/L (ref 94–250)

## 2009-07-23 ENCOUNTER — Ambulatory Visit (HOSPITAL_COMMUNITY): Admission: RE | Admit: 2009-07-23 | Discharge: 2009-07-23 | Payer: Self-pay | Admitting: Oncology

## 2009-08-02 ENCOUNTER — Inpatient Hospital Stay (HOSPITAL_COMMUNITY): Admission: AD | Admit: 2009-08-02 | Discharge: 2009-08-05 | Payer: Self-pay | Admitting: Gastroenterology

## 2009-08-03 ENCOUNTER — Ambulatory Visit: Payer: Self-pay | Admitting: Oncology

## 2009-08-17 ENCOUNTER — Encounter: Admission: RE | Admit: 2009-08-17 | Discharge: 2009-08-17 | Payer: Self-pay | Admitting: Internal Medicine

## 2009-08-25 ENCOUNTER — Ambulatory Visit: Payer: Self-pay | Admitting: Oncology

## 2009-10-11 ENCOUNTER — Ambulatory Visit: Payer: Self-pay | Admitting: Oncology

## 2009-10-12 LAB — CBC WITH DIFFERENTIAL/PLATELET
Basophils Absolute: 0 10*3/uL (ref 0.0–0.1)
Eosinophils Absolute: 0 10*3/uL (ref 0.0–0.5)
HGB: 10.9 g/dL — ABNORMAL LOW (ref 11.6–15.9)
LYMPH%: 9.2 % — ABNORMAL LOW (ref 14.0–49.7)
MCV: 88.5 fL (ref 79.5–101.0)
MONO%: 5.2 % (ref 0.0–14.0)
NEUT#: 8.2 10*3/uL — ABNORMAL HIGH (ref 1.5–6.5)
Platelets: 234 10*3/uL (ref 145–400)

## 2009-10-13 LAB — TRANSFERRIN RECEPTOR, SOLUABLE: Transferrin Receptor, Soluble: 16 nmol/L

## 2009-10-13 LAB — COMPREHENSIVE METABOLIC PANEL
AST: 15 U/L (ref 0–37)
Albumin: 3.9 g/dL (ref 3.5–5.2)
Alkaline Phosphatase: 120 U/L — ABNORMAL HIGH (ref 39–117)
BUN: 6 mg/dL (ref 6–23)
Potassium: 3.7 mEq/L (ref 3.5–5.3)
Sodium: 138 mEq/L (ref 135–145)
Total Bilirubin: 0.4 mg/dL (ref 0.3–1.2)

## 2009-10-13 LAB — IRON AND TIBC
%SAT: 7 % — ABNORMAL LOW (ref 20–55)
TIBC: 341 ug/dL (ref 250–470)

## 2009-10-13 LAB — CANCER ANTIGEN 19-9: CA 19-9: 15.1 U/mL (ref <35.0)

## 2009-11-22 ENCOUNTER — Ambulatory Visit: Payer: Self-pay | Admitting: Oncology

## 2009-11-23 LAB — CBC WITH DIFFERENTIAL/PLATELET
Basophils Absolute: 0 10*3/uL (ref 0.0–0.1)
EOS%: 0.2 % (ref 0.0–7.0)
HCT: 31.2 % — ABNORMAL LOW (ref 34.8–46.6)
HGB: 10.7 g/dL — ABNORMAL LOW (ref 11.6–15.9)
LYMPH%: 12.4 % — ABNORMAL LOW (ref 14.0–49.7)
MCH: 30.5 pg (ref 25.1–34.0)
MCV: 89.1 fL (ref 79.5–101.0)
NEUT%: 82.2 % — ABNORMAL HIGH (ref 38.4–76.8)
Platelets: 255 10*3/uL (ref 145–400)
lymph#: 1 10*3/uL (ref 0.9–3.3)

## 2009-11-23 LAB — COMPREHENSIVE METABOLIC PANEL
AST: 19 U/L (ref 0–37)
BUN: 7 mg/dL (ref 6–23)
Calcium: 8.7 mg/dL (ref 8.4–10.5)
Chloride: 103 mEq/L (ref 96–112)
Creatinine, Ser: 0.8 mg/dL (ref 0.40–1.20)

## 2009-11-23 LAB — LACTATE DEHYDROGENASE: LDH: 120 U/L (ref 94–250)

## 2009-11-24 LAB — CANCER ANTIGEN 19-9: CA 19-9: 14.9 U/mL (ref <35.0)

## 2009-11-24 LAB — VITAMIN B12: Vitamin B-12: 638 pg/mL (ref 211–911)

## 2009-12-27 ENCOUNTER — Ambulatory Visit: Payer: Self-pay | Admitting: Oncology

## 2010-02-09 ENCOUNTER — Ambulatory Visit: Payer: Self-pay | Admitting: Oncology

## 2010-03-08 LAB — COMPREHENSIVE METABOLIC PANEL
ALT: 18 U/L (ref 0–35)
AST: 27 U/L (ref 0–37)
Albumin: 3.5 g/dL (ref 3.5–5.2)
Alkaline Phosphatase: 122 U/L — ABNORMAL HIGH (ref 39–117)
BUN: 7 mg/dL (ref 6–23)
Chloride: 103 mEq/L (ref 96–112)
Creatinine, Ser: 0.76 mg/dL (ref 0.40–1.20)
Potassium: 4 mEq/L (ref 3.5–5.3)

## 2010-03-08 LAB — CBC WITH DIFFERENTIAL/PLATELET
BASO%: 0.8 % (ref 0.0–2.0)
EOS%: 1.2 % (ref 0.0–7.0)
HCT: 33.8 % — ABNORMAL LOW (ref 34.8–46.6)
MCH: 31.2 pg (ref 25.1–34.0)
MCHC: 34.4 g/dL (ref 31.5–36.0)
MONO#: 0.3 10*3/uL (ref 0.1–0.9)
NEUT%: 62 % (ref 38.4–76.8)
RBC: 3.72 10*6/uL (ref 3.70–5.45)
RDW: 13.9 % (ref 11.2–14.5)
WBC: 3.2 10*3/uL — ABNORMAL LOW (ref 3.9–10.3)
lymph#: 0.8 10*3/uL — ABNORMAL LOW (ref 0.9–3.3)

## 2010-03-08 LAB — LACTATE DEHYDROGENASE: LDH: 136 U/L (ref 94–250)

## 2010-04-01 ENCOUNTER — Ambulatory Visit: Payer: Self-pay | Admitting: Oncology

## 2010-05-13 ENCOUNTER — Encounter
Admission: RE | Admit: 2010-05-13 | Discharge: 2010-05-13 | Payer: Self-pay | Source: Home / Self Care | Attending: Gastroenterology | Admitting: Gastroenterology

## 2010-05-25 ENCOUNTER — Ambulatory Visit: Payer: Self-pay | Admitting: Oncology

## 2010-06-19 ENCOUNTER — Encounter (HOSPITAL_COMMUNITY): Payer: Self-pay | Admitting: Oncology

## 2010-06-19 ENCOUNTER — Encounter: Payer: Self-pay | Admitting: Gastroenterology

## 2010-06-22 ENCOUNTER — Ambulatory Visit (HOSPITAL_COMMUNITY)
Admission: RE | Admit: 2010-06-22 | Discharge: 2010-06-22 | Payer: Self-pay | Source: Home / Self Care | Attending: Emergency Medicine | Admitting: Emergency Medicine

## 2010-07-05 ENCOUNTER — Other Ambulatory Visit (HOSPITAL_COMMUNITY): Payer: Self-pay | Admitting: Oncology

## 2010-07-05 ENCOUNTER — Encounter (HOSPITAL_BASED_OUTPATIENT_CLINIC_OR_DEPARTMENT_OTHER): Payer: Medicare Other | Admitting: Oncology

## 2010-07-05 DIAGNOSIS — C241 Malignant neoplasm of ampulla of Vater: Secondary | ICD-10-CM

## 2010-07-05 DIAGNOSIS — E538 Deficiency of other specified B group vitamins: Secondary | ICD-10-CM

## 2010-07-05 DIAGNOSIS — R634 Abnormal weight loss: Secondary | ICD-10-CM

## 2010-07-05 LAB — COMPREHENSIVE METABOLIC PANEL
BUN: 12 mg/dL (ref 6–23)
CO2: 30 mEq/L (ref 19–32)
Creatinine, Ser: 0.77 mg/dL (ref 0.40–1.20)
Glucose, Bld: 69 mg/dL — ABNORMAL LOW (ref 70–99)
Total Bilirubin: 0.4 mg/dL (ref 0.3–1.2)

## 2010-07-05 LAB — CBC WITH DIFFERENTIAL/PLATELET
Eosinophils Absolute: 0 10*3/uL (ref 0.0–0.5)
HCT: 33.2 % — ABNORMAL LOW (ref 34.8–46.6)
LYMPH%: 16.2 % (ref 14.0–49.7)
MCV: 91.7 fL (ref 79.5–101.0)
MONO#: 0.5 10*3/uL (ref 0.1–0.9)
MONO%: 5.6 % (ref 0.0–14.0)
NEUT#: 6.6 10*3/uL — ABNORMAL HIGH (ref 1.5–6.5)
NEUT%: 77.4 % — ABNORMAL HIGH (ref 38.4–76.8)
Platelets: 194 10*3/uL (ref 145–400)
WBC: 8.5 10*3/uL (ref 3.9–10.3)

## 2010-07-05 LAB — FERRITIN: Ferritin: 26 ng/mL (ref 10–291)

## 2010-07-05 LAB — CEA: CEA: 4.4 ng/mL (ref 0.0–5.0)

## 2010-07-05 LAB — CANCER ANTIGEN 19-9: CA 19-9: 21.2 U/mL (ref <35.0)

## 2010-07-05 LAB — VITAMIN B12: Vitamin B-12: 462 pg/mL (ref 211–911)

## 2010-07-05 LAB — IRON AND TIBC: %SAT: 10 % — ABNORMAL LOW (ref 20–55)

## 2010-07-05 LAB — LACTATE DEHYDROGENASE: LDH: 119 U/L (ref 94–250)

## 2010-08-19 ENCOUNTER — Encounter (HOSPITAL_COMMUNITY): Payer: Self-pay | Admitting: Radiology

## 2010-08-19 ENCOUNTER — Emergency Department (HOSPITAL_COMMUNITY)
Admission: EM | Admit: 2010-08-19 | Discharge: 2010-08-20 | Disposition: A | Discharge status: Home / Self Care | Payer: Medicare Other | Attending: Emergency Medicine | Admitting: Emergency Medicine

## 2010-08-19 ENCOUNTER — Emergency Department (HOSPITAL_COMMUNITY): Payer: Medicare Other

## 2010-08-19 DIAGNOSIS — J4489 Other specified chronic obstructive pulmonary disease: Secondary | ICD-10-CM | POA: Insufficient documentation

## 2010-08-19 DIAGNOSIS — J449 Chronic obstructive pulmonary disease, unspecified: Secondary | ICD-10-CM | POA: Insufficient documentation

## 2010-08-19 DIAGNOSIS — J984 Other disorders of lung: Secondary | ICD-10-CM | POA: Insufficient documentation

## 2010-08-19 DIAGNOSIS — F17200 Nicotine dependence, unspecified, uncomplicated: Secondary | ICD-10-CM | POA: Insufficient documentation

## 2010-08-19 DIAGNOSIS — K449 Diaphragmatic hernia without obstruction or gangrene: Secondary | ICD-10-CM | POA: Insufficient documentation

## 2010-08-19 DIAGNOSIS — R0789 Other chest pain: Secondary | ICD-10-CM | POA: Insufficient documentation

## 2010-08-19 HISTORY — DX: Malignant (primary) neoplasm, unspecified: C80.1

## 2010-08-19 LAB — COMPREHENSIVE METABOLIC PANEL
ALT: 12 U/L (ref 0–35)
Albumin: 3.1 g/dL — ABNORMAL LOW (ref 3.5–5.2)
Alkaline Phosphatase: 99 U/L (ref 39–117)
BUN: 11 mg/dL (ref 6–23)
Calcium: 8.5 mg/dL (ref 8.4–10.5)
Potassium: 4 mEq/L (ref 3.5–5.1)
Sodium: 135 mEq/L (ref 135–145)
Total Protein: 5.9 g/dL — ABNORMAL LOW (ref 6.0–8.3)

## 2010-08-19 LAB — CBC
MCH: 30.4 pg (ref 26.0–34.0)
MCHC: 33.2 g/dL (ref 30.0–36.0)
MCV: 91.4 fL (ref 78.0–100.0)
Platelets: 165 10*3/uL (ref 150–400)
RDW: 13.4 % (ref 11.5–15.5)
WBC: 8.2 10*3/uL (ref 4.0–10.5)

## 2010-08-19 LAB — DIFFERENTIAL
Eosinophils Absolute: 0.1 10*3/uL (ref 0.0–0.7)
Eosinophils Relative: 1 % (ref 0–5)
Lymphs Abs: 1.6 10*3/uL (ref 0.7–4.0)
Monocytes Absolute: 0.5 10*3/uL (ref 0.1–1.0)

## 2010-08-19 LAB — POCT CARDIAC MARKERS
CKMB, poc: <1 ng/mL — ABNORMAL LOW (ref 1.0–8.0)
CKMB, poc: <1 ng/mL — ABNORMAL LOW (ref 1.0–8.0)
Myoglobin, poc: 58.1 ng/mL (ref 12–200)
Troponin i, poc: <0.05 ng/mL (ref 0.00–0.09)
Troponin i, poc: <0.05 ng/mL (ref 0.00–0.09)

## 2010-08-19 IMAGING — CT CT ANGIO CHEST
2 of 6 series · 19 of 36 positions shown · IV contrast (omnipaque)
Comparison: chest x-ray [DATE]

CLINICAL DATA: Chest pain, elevated D-dimer, shortness of breath.

CT ANGIOGRAPHY CHEST WITH CONTRAST
TECHNIQUE: Multidetector CT imaging of the chest was performed
using the standard protocol during bolus administration of
intravenous contrast.  Multiplanar CT image reconstructions
including MIPs were obtained to evaluate the vascular anatomy.
Contrast:  100 ml Omnipaque 300 IV.

[Series 10: pulm embolism 1.0 b25f thins · axial · 0.57mm/px · z∈[+1091,+1373]mm · 18 of 314 slices shown]
[im 16/314  lung]
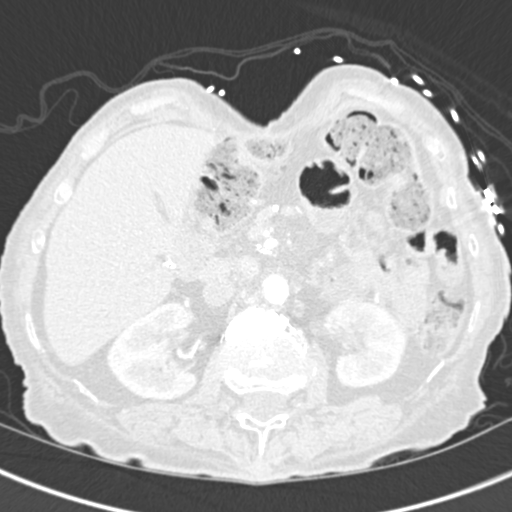
[im 32/314  mediastinal]
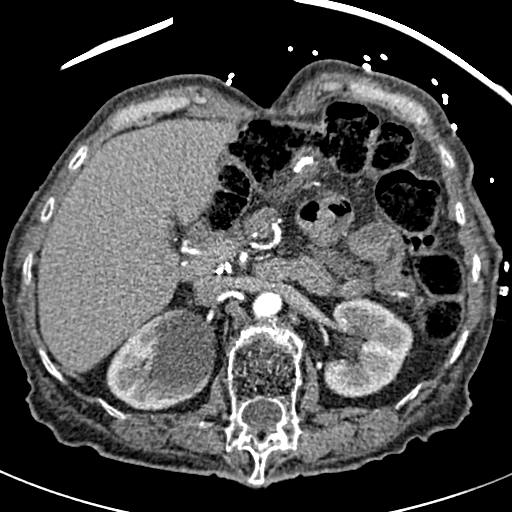
[im 47/314  lung]
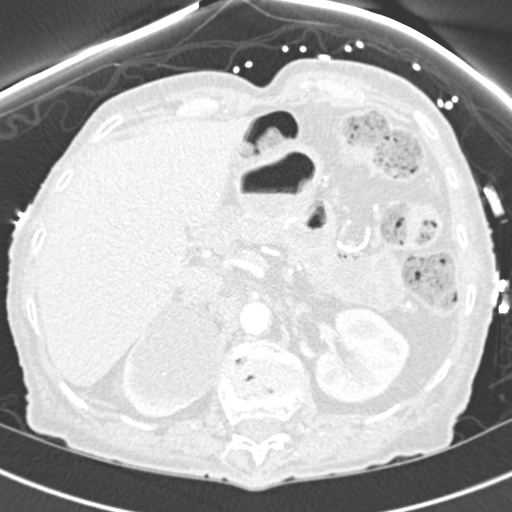
[im 63/314  mediastinal]
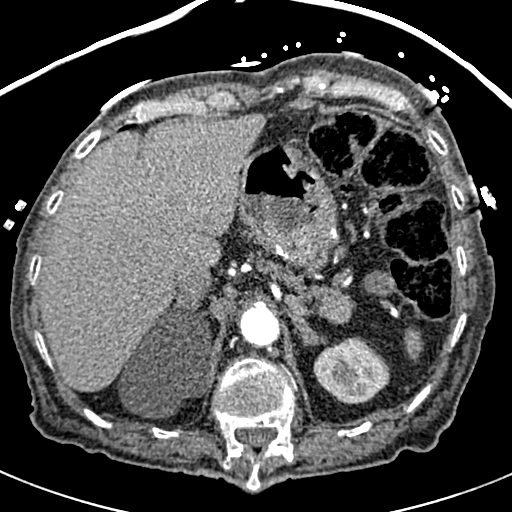
[im 79/314  lung]
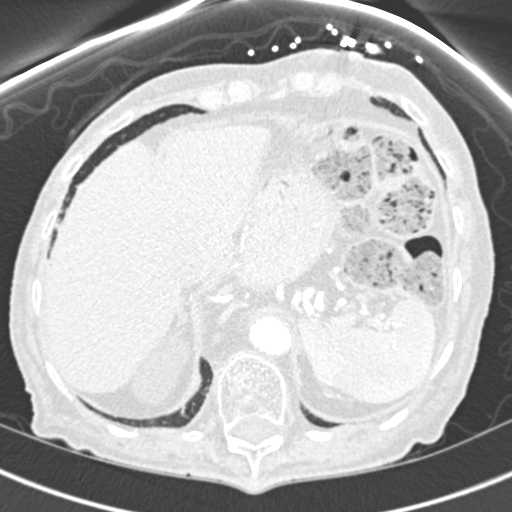
[im 94/314  mediastinal]
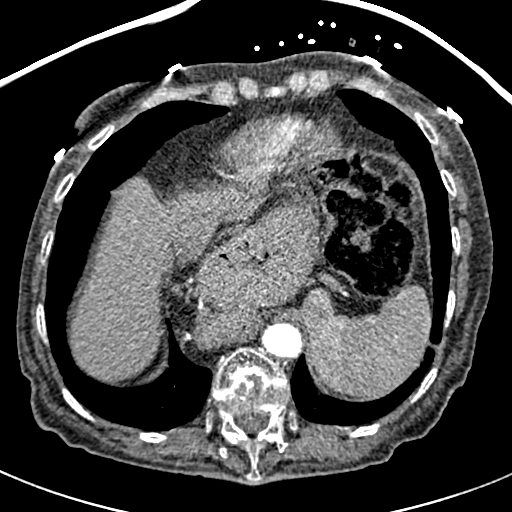
[im 110/314  lung]
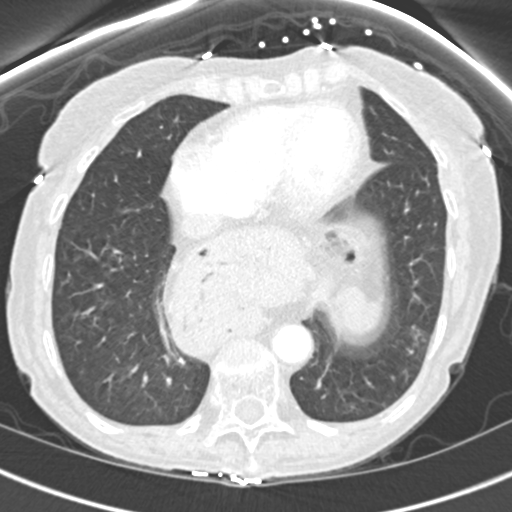
[im 126/314  mediastinal]
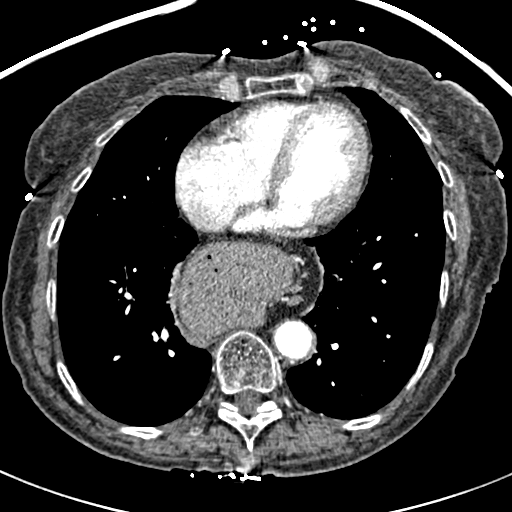
[im 141/314  lung]
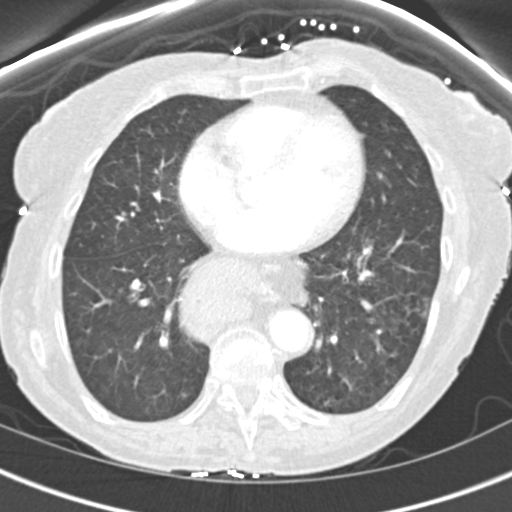
[im 173/314  mediastinal]
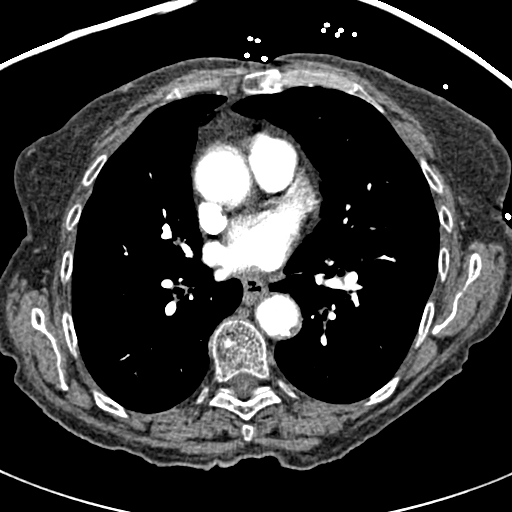
[im 188/314  lung]
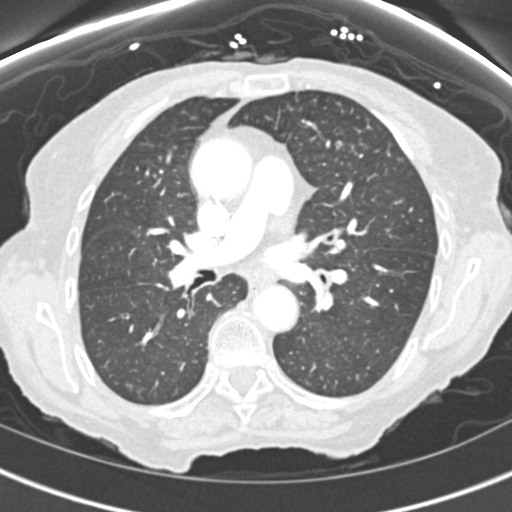
[im 204/314  mediastinal]
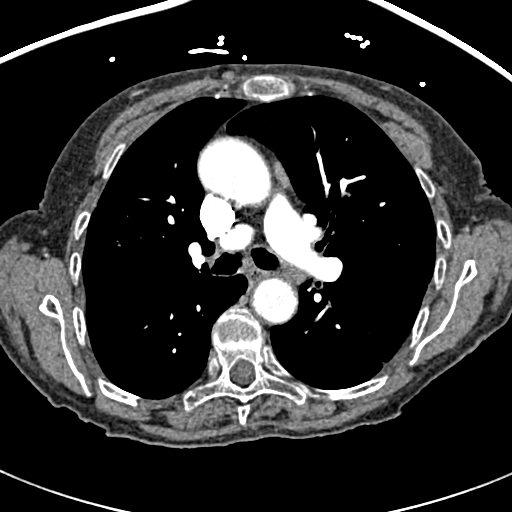
[im 220/314  lung]
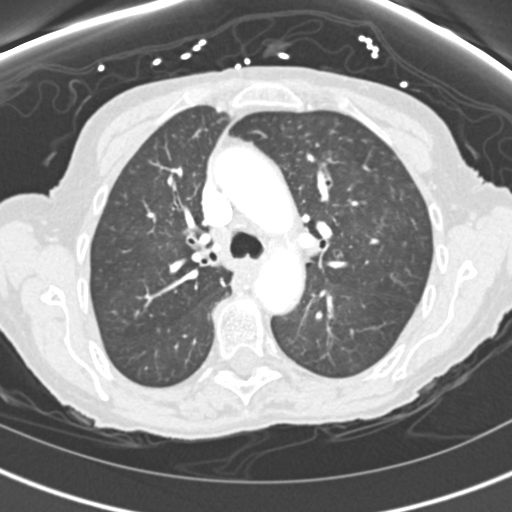
[im 235/314  mediastinal]
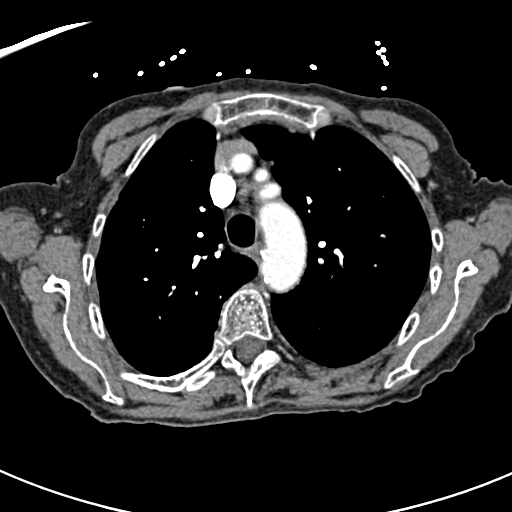
[im 251/314  lung]
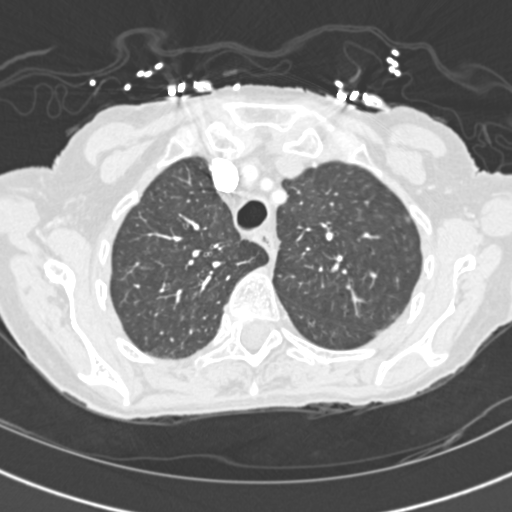
[im 267/314  mediastinal]
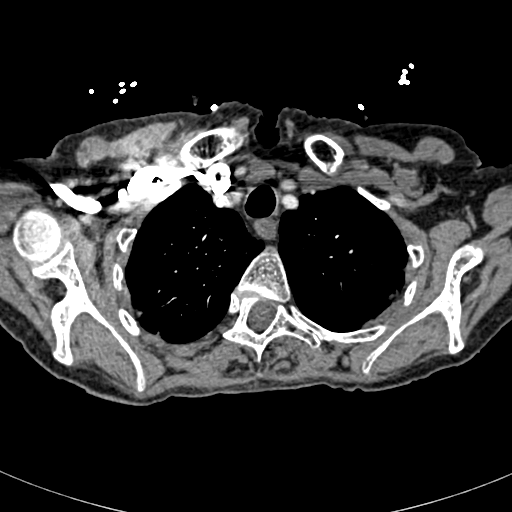
[im 282/314  lung]
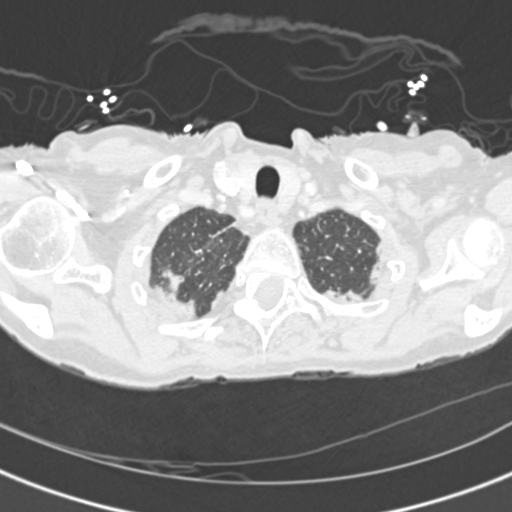
[im 298/314  mediastinal]
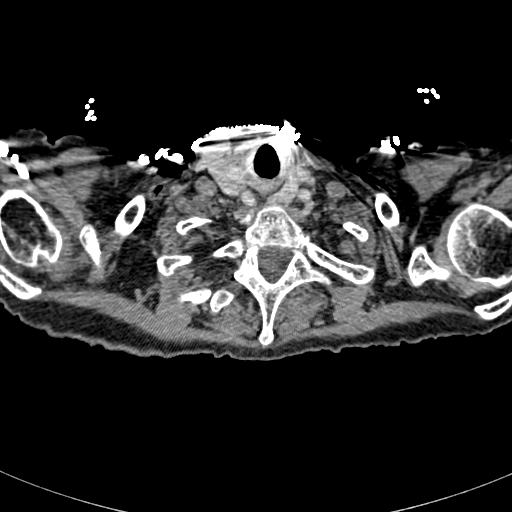

[Series 602: <mpr thick range> · coronal · 0.61mm/px · 1 of 103 slices shown]
[im 52/103  mediastinal]
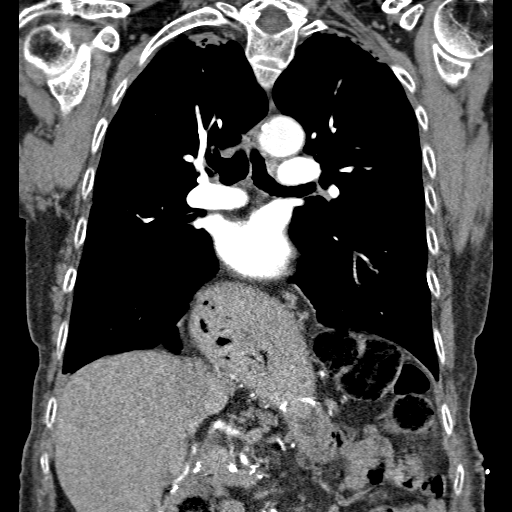

[19 of 36 positions shown; findings below may reference images not displayed]

FINDINGS: There is a large hiatal hernia.  No filling defects in
the pulmonary arteries to suggest pulmonary emboli.  COPD changes
with mild hyperinflation.  There are scattered nodular tree in bud
densities throughout the lower lobes bilaterally in the lingula,
likely mild small airways disease/alveolitis.  Scarring noted in
the apices.  No pleural effusions.

Heart is normal size. Aorta is normal caliber. No mediastinal,
hilar, or axillary adenopathy.  Visualized thyroid and chest wall
soft tissues unremarkable. Imaging into the upper abdomen shows no
acute findings.

Review of the MIP images confirms the above findings.
IMPRESSION: No evidence of pulmonary embolus.

Mild hyperinflation.

Scattered tree in bud densities within the lungs, likely small
airways disease/alveolitis.

Large hiatal hernia.

## 2010-08-19 MED ORDER — IOHEXOL 300 MG/ML  SOLN
100.0000 mL | Freq: Once | INTRAMUSCULAR | Status: AC | PRN
  Administered 2010-08-19: 100 mL via INTRAVENOUS

## 2010-08-22 LAB — COMPREHENSIVE METABOLIC PANEL
ALT: 12 U/L (ref 0–35)
AST: 14 U/L (ref 0–37)
Albumin: 2.8 g/dL — ABNORMAL LOW (ref 3.5–5.2)
Alkaline Phosphatase: 82 U/L (ref 39–117)
BUN: 10 mg/dL (ref 6–23)
Chloride: 99 mEq/L (ref 96–112)
Potassium: 3.9 mEq/L (ref 3.5–5.1)
Sodium: 133 mEq/L — ABNORMAL LOW (ref 135–145)
Total Bilirubin: 0.3 mg/dL (ref 0.3–1.2)
Total Protein: 5.7 g/dL — ABNORMAL LOW (ref 6.0–8.3)

## 2010-08-22 LAB — CBC
HCT: 33.8 % — ABNORMAL LOW (ref 36.0–46.0)
Platelets: 265 10*3/uL (ref 150–400)
RDW: 13.4 % (ref 11.5–15.5)
WBC: 7.2 10*3/uL (ref 4.0–10.5)

## 2010-08-22 LAB — PROTIME-INR: INR: 1.05 (ref 0.00–1.49)

## 2010-09-23 ENCOUNTER — Other Ambulatory Visit: Payer: Self-pay | Admitting: Gastroenterology

## 2010-10-11 NOTE — Op Note (Signed)
Mackenzie Mendoza, Mackenzie Mendoza            ACCOUNT NO.:  [id number]   MEDICAL RECORD NO.:  [id number]          PATIENT TYPE:  INP   LOCATION:  3022                         FACILITY:  [facility]   PHYSICIAN:  Reinaldo Meeker, M.D. DATE OF BIRTH:  01-28-1934   DATE OF PROCEDURE:  03/12/2007  DATE OF DISCHARGE:                               OPERATIVE REPORT   PREOP DIAGNOSIS:  T12 compression fracture.   POSTOPERATIVE DIAGNOSIS:  T12 compression fracture.   PROCEDURE:  T12 vertebroplasty   SURGEON:  Reinaldo Meeker, M.D.   PROCEDURE IN DETAIL:  After being placed in the prone position, the  patient's back was prepped and draped in the usual sterile fashion.  Localizing fluoroscopy in AP and lateral direction was used to identify  the appropriate level.  Entry point was chosen, infiltrated with local  anesthetic.  The small stab incision was then made, and the trocar  needle was passed from a lateral-to-medial and superior-inferior  direction into the pedicle, and then into the vertebral body of T12 on  the left side.  This was found to be in excellent position.   A bone cement was then mixed in standard fashion.  Using a cement  delivery system, the uncinate was pushed into the vertebral body with  minimal pressure needed.  Excellent cross-fill and dural filling of the  vertebral body was obtained.  This was followed under AP and lateral  fluoroscopy.  Five cannulas worth of bone cement were then used. until  adequate fill had been obtained.  The working cannula was then slowly  withdrawn, while at same time pressure was pushed down, so that a trial  of cement was not then noted.  The cannula was then removed completely,  and no __________  was noted.  Three staples were then placed on the  small stab wound incision, followed by antibiotic ointment and Band-Aid.  The patient was then extubated and taken to recovery room in stable  condition.           ______________________________  Reinaldo Meeker, M.D.     ROK/MEDQ  D:  03/12/2007  T:  03/12/2007  Job:  [id number]

## 2010-10-11 NOTE — Discharge Summary (Signed)
Mackenzie Mendoza, Mackenzie Mendoza            ACCOUNT NO.:  [id number]   MEDICAL RECORD NO.:  [id number]          PATIENT TYPE:  INP   LOCATION:  3022                         FACILITY:  [facility]   PHYSICIAN:  Reinaldo Meeker, M.D. DATE OF BIRTH:  04-Nov-1933   DATE OF ADMISSION:  03/08/2007  DATE OF DISCHARGE:  03/14/2007                               DISCHARGE SUMMARY   PRIMARY DIAGNOSIS:  A compression fracture of T12.   PRIMARY OPERATIVE PROCEDURE:  A T12 vertebroplasty.   HISTORY:  [name] is a 77 year old female who recently had an L2  kyphoplasty and had done well.  She then came back in with increasing  back pain once again and was noted now to have a T12 fracture.  She was  admitted and evaluation was done with an MRI scan which did indeed show  that the T12 compression fracture was acute, but there was no  significant retropulsion of bone.  The options were discussed.  The  patient requested surgical intervention and on March 12, 2007 she  underwent a T12 vertebroplasty which she tolerated well.  She had some  pain, but had not marked improvement compared to preoperatively.  She  was able to increase her activities.  By March 14, 2007, she was up  ambulating well.  She had some constipation which was treated with  laxatives.  When she had a bowel movement, she was feeling much better  and so she was discharged home.   DISCHARGE CONDITION:  Markedly improved versus admission.           ______________________________  Reinaldo Meeker, M.D.     ROK/MEDQ  D:  04/30/2007  T:  04/30/2007  Job:  [id number]

## 2010-10-11 NOTE — H&P (Signed)
Mackenzie Mendoza NO.:  [id number]   MEDICAL RECORD NO.:  [id number]          PATIENT TYPE:  INP   LOCATION:  3022                         FACILITY:  [facility]   PHYSICIAN:  Tia Alert, MD     DATE OF BIRTH:  1933/06/08   DATE OF ADMISSION:  03/08/2007  DATE OF DISCHARGE:                              HISTORY & PHYSICAL   ADMISSION DIAGNOSIS:  T12 fracture.   HISTORY OF PRESENT ILLNESS:  [name] is a 77 year old female with a  history of cancer of the bile ducts, status post Whipple procedure who  is status post L2 kyphoplasty on March 05, 2007, by Dr. Gerlene Fee who was  seen in the emergency department tonight with complaints of a 1-day  history of severe back pain.  The patient states that she got out of bed  this morning and had severe onset of back pain.  She was doing quite  well from her kyphoplasty until this morning.  She denies any leg pain  or any numbness, tingling or weakness in her legs, though she states she  cannot walk because of her pain.  She has had morphine and Percocet here  in the emergency department and is still unable to mobilize.  She denies  any bowel or bladder changes.  She had plain films which showed the  kyphoplasty at L2 but also showed a progressing T12 osteoporotic  compression fracture and neurosurgery evaluation was requested.   PAST MEDICAL HISTORY:  As above.  The patient also denies hypertension  or diabetes.   MEDICATION ALLERGIES:  MEDICATION ALLERGIES WERE REVIEWED AND ARE  AVAILABLE IN THE CHART.   PHYSICAL EXAMINATION:  GENERAL:  Very pleasant cooperative elderly white  female lying on a stretcher.  She appears to be in general somewhat  jaundiced.  NEUROLOGIC:  Her Extraocular movements are intact. Her sclerae are  somewhat icteric.  She seems to have good strength in her lower  extremities with good muscle tone and bulk and normal sensation.  Her  gait is not tested.  Reflexes are okay. Plain films of the  lumbar spine  show the kyphoplasty at L2 but what appears to be new is a T12  osteoporotic compression fracture with about 30% of anterior body loss  of height and some minimal kyphosis but no retropulsed fragments or  significant canal compromises best can be evaluated with plain films.   ASSESSMENT/PLAN:  This is a 77 year old female who is status post L2  kyphoplasty by Dr. Gerlene Fee who now presents with a T12 osteoporotic  compression fracture and severe pain.  We will admit her for bracing and  pain control.  This was discussed at length with the family and the  patient.      Tia Alert, MD  Electronically Signed     DSJ/MEDQ  D:  03/08/2007  T:  03/09/2007  Job:  [id number]

## 2010-10-11 NOTE — Op Note (Signed)
[name], Mackenzie Mendoza            ACCOUNT NO.:  [id number]   MEDICAL RECORD NO.:  [id number]          PATIENT TYPE:  INP   LOCATION:  2899                         FACILITY:  [facility]   PHYSICIAN:  Reinaldo Meeker, M.D. DATE OF BIRTH:  1933-06-24   DATE OF PROCEDURE:  03/05/2007  DATE OF DISCHARGE:                               OPERATIVE REPORT   PREOPERATIVE DIAGNOSIS:  Compression fracture, L2.   POSTOPERATIVE DIAGNOSIS:  Compression fracture, L2.   PROCEDURES:  1. L2 vertebral body biopsy.  2. L2 kyphoplasty.   SURGEON:  Reinaldo Meeker, M.D.   PROCEDURE IN DETAIL:  After being placed in the prone position, the  patient's back was prepped and draped in the usual sterile fashion.  AP  and lateral fluoroscopy was brought into the field to identify the  appropriate level and give guidance for the trocar needles.  Starting on  the patient's left side, a skin entry point was infiltrated with local  anesthetic and a small stab wound incision with a 15 blade.  The trocar  needle was passed from a superior to inferior and a lateral to medial  direction across the pedicle and into the body to appropriate distance.  A similar procedure was carried on the opposite side.  Bone biopsies  were then taken bilaterally with the drill and biopsy instrument and  sent for pathology.  At this time balloons were sequentially inflated  and an excellent cavity was formed within the vertebral body, and the  vertebral body was able to be expanded back to a nice height and shape.  The bone cement was then mixed and the balloons deflated and cavities  filled nicely with the bone cement, which also infiltrated nicely  through the vertebral body.  This was followed under AP and lateral  fluoroscopy.  When an appropriate amount of cement had been added, the  working cannulas were slowly withdrawn and the bone cement tapped down  without difficulty.  They were then removed and final fluoroscopy in an  AP  and lateral direction showed excellent fill and no table to the soft  tissues.  The wounds were then closed with staples bilaterally and  sterile Band-Aids were applied.  The patient was extubated and taken to  the recovery room in stable condition.           ______________________________  Reinaldo Meeker, M.D.     ROK/MEDQ  D:  03/05/2007  T:  03/05/2007  Job:  [id number]

## 2010-11-08 ENCOUNTER — Encounter (HOSPITAL_BASED_OUTPATIENT_CLINIC_OR_DEPARTMENT_OTHER): Payer: Medicare Other | Admitting: Oncology

## 2010-11-08 ENCOUNTER — Other Ambulatory Visit (HOSPITAL_COMMUNITY): Payer: Self-pay | Admitting: Oncology

## 2010-11-08 DIAGNOSIS — C241 Malignant neoplasm of ampulla of Vater: Secondary | ICD-10-CM

## 2010-11-08 DIAGNOSIS — D649 Anemia, unspecified: Secondary | ICD-10-CM

## 2010-11-08 DIAGNOSIS — E538 Deficiency of other specified B group vitamins: Secondary | ICD-10-CM

## 2010-11-08 LAB — COMPREHENSIVE METABOLIC PANEL
ALT: 14 U/L (ref 0–35)
AST: 19 U/L (ref 0–37)
BUN: 9 mg/dL (ref 6–23)
Calcium: 9.2 mg/dL (ref 8.4–10.5)
Chloride: 100 mEq/L (ref 96–112)
Creatinine, Ser: 0.86 mg/dL (ref 0.50–1.10)
Total Bilirubin: 0.4 mg/dL (ref 0.3–1.2)

## 2010-11-08 LAB — IRON AND TIBC
%SAT: 19 % — ABNORMAL LOW (ref 20–55)
Iron: 74 ug/dL (ref 42–145)
UIBC: 325 ug/dL

## 2010-11-08 LAB — CBC WITH DIFFERENTIAL/PLATELET
BASO%: 0.3 % (ref 0.0–2.0)
Basophils Absolute: 0 10*3/uL (ref 0.0–0.1)
EOS%: 0.2 % (ref 0.0–7.0)
HCT: 36 % (ref 34.8–46.6)
HGB: 12.2 g/dL (ref 11.6–15.9)
LYMPH%: 12.1 % — ABNORMAL LOW (ref 14.0–49.7)
MCH: 30.6 pg (ref 25.1–34.0)
MCHC: 33.8 g/dL (ref 31.5–36.0)
MCV: 90.6 fL (ref 79.5–101.0)
NEUT%: 82.3 % — ABNORMAL HIGH (ref 38.4–76.8)
Platelets: 194 10*3/uL (ref 145–400)
lymph#: 1 10*3/uL (ref 0.9–3.3)

## 2010-11-08 LAB — FERRITIN: Ferritin: 41 ng/mL (ref 10–291)

## 2010-11-08 LAB — LACTATE DEHYDROGENASE: LDH: 141 U/L (ref 94–250)

## 2011-01-04 ENCOUNTER — Other Ambulatory Visit: Payer: Self-pay | Admitting: Internal Medicine

## 2011-01-04 DIAGNOSIS — N951 Menopausal and female climacteric states: Secondary | ICD-10-CM

## 2011-01-04 DIAGNOSIS — Z1231 Encounter for screening mammogram for malignant neoplasm of breast: Secondary | ICD-10-CM

## 2011-01-19 ENCOUNTER — Ambulatory Visit
Admission: RE | Admit: 2011-01-19 | Discharge: 2011-01-19 | Disposition: A | Discharge status: Home / Self Care | Payer: Medicare Other | Source: Ambulatory Visit | Attending: Internal Medicine | Admitting: Internal Medicine

## 2011-01-19 DIAGNOSIS — N951 Menopausal and female climacteric states: Secondary | ICD-10-CM

## 2011-01-19 DIAGNOSIS — Z1231 Encounter for screening mammogram for malignant neoplasm of breast: Secondary | ICD-10-CM

## 2011-02-24 ENCOUNTER — Encounter (HOSPITAL_COMMUNITY): Payer: Medicare Other

## 2011-03-07 ENCOUNTER — Other Ambulatory Visit (HOSPITAL_COMMUNITY): Payer: Self-pay | Admitting: Oncology

## 2011-03-07 ENCOUNTER — Encounter (HOSPITAL_BASED_OUTPATIENT_CLINIC_OR_DEPARTMENT_OTHER): Payer: Medicare Other | Admitting: Oncology

## 2011-03-07 ENCOUNTER — Encounter (HOSPITAL_COMMUNITY): Payer: Medicare Other | Attending: Internal Medicine

## 2011-03-07 DIAGNOSIS — R634 Abnormal weight loss: Secondary | ICD-10-CM

## 2011-03-07 DIAGNOSIS — Z8509 Personal history of malignant neoplasm of other digestive organs: Secondary | ICD-10-CM

## 2011-03-07 DIAGNOSIS — Z923 Personal history of irradiation: Secondary | ICD-10-CM

## 2011-03-07 DIAGNOSIS — D649 Anemia, unspecified: Secondary | ICD-10-CM

## 2011-03-07 DIAGNOSIS — M810 Age-related osteoporosis without current pathological fracture: Secondary | ICD-10-CM | POA: Insufficient documentation

## 2011-03-07 DIAGNOSIS — F17200 Nicotine dependence, unspecified, uncomplicated: Secondary | ICD-10-CM

## 2011-03-07 DIAGNOSIS — C241 Malignant neoplasm of ampulla of Vater: Secondary | ICD-10-CM

## 2011-03-07 DIAGNOSIS — E538 Deficiency of other specified B group vitamins: Secondary | ICD-10-CM

## 2011-03-07 LAB — CBC WITH DIFFERENTIAL/PLATELET
EOS%: 0.7 % (ref 0.0–7.0)
MCH: 30.4 pg (ref 25.1–34.0)
MCV: 90.6 fL (ref 79.5–101.0)
MONO%: 6.9 % (ref 0.0–14.0)
NEUT#: 2.9 10*3/uL (ref 1.5–6.5)
RBC: 3.82 10*6/uL (ref 3.70–5.45)
RDW: 13.2 % (ref 11.2–14.5)
nRBC: 0 % (ref 0–0)

## 2011-03-07 LAB — COMPREHENSIVE METABOLIC PANEL
ALT: 11 U/L (ref 0–35)
Alkaline Phosphatase: 146 U/L — ABNORMAL HIGH (ref 39–117)
Sodium: 136 mEq/L (ref 135–145)
Total Bilirubin: 0.2 mg/dL — ABNORMAL LOW (ref 0.3–1.2)
Total Protein: 7.6 g/dL (ref 6.0–8.3)

## 2011-03-07 LAB — IRON AND TIBC
%SAT: 12 % — ABNORMAL LOW (ref 20–55)
TIBC: 372 ug/dL (ref 250–470)
UIBC: 326 ug/dL (ref 125–400)

## 2011-03-07 LAB — VITAMIN B12: Vitamin B-12: 524 pg/mL (ref 211–911)

## 2011-03-09 LAB — CBC
HCT: 33.7 — ABNORMAL LOW
Hemoglobin: 10 — ABNORMAL LOW
MCHC: 33.4
MCV: 88
Platelets: 212
RBC: 3.39 — ABNORMAL LOW
RDW: 15.3 — ABNORMAL HIGH
WBC: 6.9
WBC: 6.9

## 2011-03-09 LAB — CULTURE, BLOOD (ROUTINE X 2): Culture: NO GROWTH

## 2011-03-09 LAB — COMPREHENSIVE METABOLIC PANEL
ALT: 19
ALT: 20
AST: 26
Albumin: 3.1 — ABNORMAL LOW
Alkaline Phosphatase: 236 — ABNORMAL HIGH
BUN: 12
CO2: 28
Calcium: 8.7
Chloride: 97
Chloride: 99
GFR calc non Af Amer: >60
Glucose, Bld: 129 — ABNORMAL HIGH
Potassium: 3.7
Sodium: 131 — ABNORMAL LOW
Sodium: 134 — ABNORMAL LOW
Total Bilirubin: 0.3
Total Bilirubin: 0.7
Total Protein: 6.7

## 2011-03-09 LAB — DIFFERENTIAL
Basophils Absolute: 0
Basophils Relative: 0
Eosinophils Absolute: 0
Eosinophils Relative: 1
Monocytes Absolute: 0.4
Monocytes Relative: 7

## 2011-03-09 LAB — URINALYSIS, ROUTINE W REFLEX MICROSCOPIC
Ketones, ur: NEGATIVE
Nitrite: NEGATIVE
Protein, ur: NEGATIVE

## 2011-07-05 DIAGNOSIS — F411 Generalized anxiety disorder: Secondary | ICD-10-CM | POA: Diagnosis not present

## 2011-07-05 DIAGNOSIS — F17200 Nicotine dependence, unspecified, uncomplicated: Secondary | ICD-10-CM | POA: Diagnosis not present

## 2011-07-05 DIAGNOSIS — J449 Chronic obstructive pulmonary disease, unspecified: Secondary | ICD-10-CM | POA: Diagnosis not present

## 2011-07-05 DIAGNOSIS — M545 Low back pain: Secondary | ICD-10-CM | POA: Diagnosis not present

## 2011-07-05 DIAGNOSIS — M810 Age-related osteoporosis without current pathological fracture: Secondary | ICD-10-CM | POA: Diagnosis not present

## 2011-07-05 DIAGNOSIS — K219 Gastro-esophageal reflux disease without esophagitis: Secondary | ICD-10-CM | POA: Diagnosis not present

## 2011-09-05 ENCOUNTER — Other Ambulatory Visit: Payer: Self-pay | Admitting: Oncology

## 2011-09-05 ENCOUNTER — Encounter: Payer: Self-pay | Admitting: Oncology

## 2011-09-05 ENCOUNTER — Other Ambulatory Visit (HOSPITAL_BASED_OUTPATIENT_CLINIC_OR_DEPARTMENT_OTHER): Payer: Medicare Other | Admitting: Lab

## 2011-09-05 ENCOUNTER — Ambulatory Visit (HOSPITAL_BASED_OUTPATIENT_CLINIC_OR_DEPARTMENT_OTHER): Payer: Medicare Other | Admitting: Oncology

## 2011-09-05 ENCOUNTER — Telehealth: Payer: Self-pay | Admitting: Oncology

## 2011-09-05 VITALS — BP 123/77 | HR 76 | Temp 99.1°F | Ht 63.5 in | Wt 116.0 lb

## 2011-09-05 DIAGNOSIS — F17200 Nicotine dependence, unspecified, uncomplicated: Secondary | ICD-10-CM

## 2011-09-05 DIAGNOSIS — Z862 Personal history of diseases of the blood and blood-forming organs and certain disorders involving the immune mechanism: Secondary | ICD-10-CM

## 2011-09-05 DIAGNOSIS — C241 Malignant neoplasm of ampulla of Vater: Secondary | ICD-10-CM | POA: Diagnosis not present

## 2011-09-05 DIAGNOSIS — M810 Age-related osteoporosis without current pathological fracture: Secondary | ICD-10-CM | POA: Insufficient documentation

## 2011-09-05 DIAGNOSIS — D649 Anemia, unspecified: Secondary | ICD-10-CM | POA: Insufficient documentation

## 2011-09-05 DIAGNOSIS — E538 Deficiency of other specified B group vitamins: Secondary | ICD-10-CM | POA: Diagnosis not present

## 2011-09-05 LAB — CBC WITH DIFFERENTIAL/PLATELET
BASO%: 0.6 % (ref 0.0–2.0)
EOS%: 0.1 % (ref 0.0–7.0)
HCT: 36.2 % (ref 34.8–46.6)
MCHC: 33.6 g/dL (ref 31.5–36.0)
MONO#: 0.3 10*3/uL (ref 0.1–0.9)
NEUT%: 70.2 % (ref 38.4–76.8)
RBC: 3.94 10*6/uL (ref 3.70–5.45)
RDW: 13.3 % (ref 11.2–14.5)
WBC: 5.1 10*3/uL (ref 3.9–10.3)
lymph#: 1.2 10*3/uL (ref 0.9–3.3)
nRBC: 0 % (ref 0–0)

## 2011-09-05 LAB — COMPREHENSIVE METABOLIC PANEL
ALT: 10 U/L (ref 0–35)
AST: 17 U/L (ref 0–37)
Albumin: 4.2 g/dL (ref 3.5–5.2)
Alkaline Phosphatase: 106 U/L (ref 39–117)
Calcium: 8.8 mg/dL (ref 8.4–10.5)
Chloride: 102 mEq/L (ref 96–112)
Potassium: 3.9 mEq/L (ref 3.5–5.3)
Sodium: 139 mEq/L (ref 135–145)
Total Protein: 6.6 g/dL (ref 6.0–8.3)

## 2011-09-05 LAB — FERRITIN: Ferritin: 23 ng/mL (ref 10–291)

## 2011-09-05 LAB — CANCER ANTIGEN 19-9: CA 19-9: 13.5 U/mL (ref <35.0)

## 2011-09-05 LAB — VITAMIN B12: Vitamin B-12: 318 pg/mL (ref 211–911)

## 2011-09-05 NOTE — Telephone Encounter (Signed)
gv pt appt schedule for oct.  °

## 2011-09-05 NOTE — Progress Notes (Signed)
CC:   Mackenzie Friar, MD Mackenzie Haven, MD Mackenzie Koyanagi, MD Mackenzie Mendoza. Polite, M.D.  PROBLEM LIST: 1. 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  [name] was treated with combination chemotherapy and     radiation at [facility] and then underwent surgical resection at [facility] on     September 19, 2006.  The patient has done fairly well without evidence     of recurrent cancer, remains disease free.  The patient received     Xeloda and radiation from mid January of 2008 through mid March of     2008.  At the time of surgery, there was involvement of the     duodenal wall and perivascular invasion.  There was residual tumor     of 1 cm.  Tumor grade was 2.  Six lymph nodes were negative. 2. History of depression. 3. History of dyslipidemia. 4. History of vitamin B12 deficiency. 5. History of vitamin D deficiency. 6. Large hiatal hernia. 7. Chronic obstructive pulmonary disease. 8. History of gastroesophageal reflux disease. 9. Compression fracture of L2 diagnosed 02/22/2007 for which she     underwent kyphoplasty on 03/05/2007.  She has also undergone a T12     vertebroplasty on 03/12/2007. 10.History of jejunal ulcer with negative biopsies on 08/02/2009. 11.Osteoporosis with most recent bone density scan done on 01/19/2011. 12.History of weight loss.  Patient weighed 183 pounds in 2007 prior     to diagnosis.  Her most recent weights run around 115-120 pounds,     thus there has been about a 60-pound weight loss. 13.History of cigarette smoking.  The patient has been urged on     multiple occasions to stop smoking.  She does not appear to have     much interest in smoking cessation treatment.  MEDICATIONS: 1. Tylenol 500 mg every 6 hours as needed. 2. Vitamin B12 1000 mcg daily. 3. Ferrous sulfate 325 mg twice daily. 4. Prevacid 30 mg daily. 5. Zoloft 100 mg daily.  HISTORY:  Mackenzie Mendoza was seen today for followup of her T3 N0 ampulla of  Vater adenocarcinoma dating back to November 2007.  Mackenzie Mendoza remains without evidence of disease.  She is here today by herself although she tells me that her brother drove her in.  She was last seen by [place] on 03/07/2011.  She says her condition really has not changed significantly.  She has lower back discomfort, particularly with standing.  She has also had some subumbilical abdominal discomfort which she says is better now.  She denies any diarrhea, constipation, says her stools are okay.  She denies any blood in her stools.  She denies any significant changes in her condition.  She tells me that she had received Reclast from Dr. Nehemiah Mendoza back in the fall.  It does not appear that she is on calcium or vitamin D at this time.  PHYSICAL EXAM:  Mackenzie Mendoza is a slight woman who is able to get up and down from the examining table without assistance.  Her weight today is 116 pounds which has been close to her recent baseline.  Height 5 feet 3- 1/2 inches, body surface area 1.54 meters squared.  Blood pressure 123/77.  Other vital signs are normal.  There is no scleral icterus. Mouth and pharynx are benign.  Dentition is poor.  There is no adenopathy in the neck.  Lungs:  Notable for inspiratory and expiratory squeaks.  Breath sounds  are generally poor.  Cardiac:  Regular rhythm without murmur or rub.  Breasts:  Not examined.  Abdomen:  Scaphoid, soft, nontender with no organomegaly or masses palpable.  No obvious ascites.  Extremities:  No peripheral edema.  The patient has purpura over her arms.  Neurologic:  Exam is grossly normal.  LABORATORY DATA:  Today, white count 5.1, ANC 3.6, hemoglobin 12.1, hematocrit 36.2, platelets 175,000.  MCV and MCH were normal. Chemistries and CA 19-9, also iron studies are pending today. Chemistries from 03/07/2011 were normal except for an alkaline phosphatase of 146.  The patient has had slightly elevated alkaline phosphatase.  Her LDH was 140.   Vitamin B12 level was 524.  Ferritin was 28 with an iron saturation of 12%.  CA 19-9 was 8.9 on 03/07/2011.  IMAGING STUDIES: 1. Upper GI with small-bowel follow-through on 05/13/2010 showed     abnormality of the small bowel anastomosing with the stomach in     this patient who has undergone a Whipple procedure.  Considerations     are that of a nonvisualized ulceration versus local inflammation.     There was no obstruction.  There was a moderate-sized hiatal     hernia.  It was noted the patient had undergone a     gastrojejunostomy.  There was irregularity, mild edema of the     proximal jejunum. 2. CT angiogram of the chest with IV contrast on 08/19/2010 showed     scattered tree-in-bud densities within the lungs, likely small     airway disease/alveolitis.  Once again, a large hiatal hernia was     noted.  There was no evidence for pulmonary embolism.  There was     mild hyperinflation. 3. Bone density scan from 01/18/2010:  T-score of the left femoral     neck was -4.2.  T-score of the right forearm was -5.9.  The patient     had osteoporosis by WHO criteria. 4. Digital screening mammogram on 01/19/2011 was negative.  IMPRESSION AND PLAN:  Mackenzie Mendoza continues to do fairly well.  She remains quite thin with a weight that fluctuates slightly.  Today her weight was 116 pounds compared to a baseline weight of 183 pounds.  Back in 2007.  The patient remains without evidence of disease.  I talked with her about cigarette smoking cessation.  I also talked to her about her bone density scan from 01/19/2011.  I suggested she may want to contact Dr. Nehemiah Mendoza regarding further evaluation of her osteoporosis and treatment.  She says she did receive Reclast back in the fall.  I urged her to take calcium and vitamin D and to confer with Dr. Nehemiah Mendoza regarding optimal calcium and vitamin D supplementation.  We will plan to see Mackenzie Mendoza again in 6 months at which time we will check CBC,  chemistries, CA 19-9, iron studies and vitamin D level.  We will also check CBC, iron studies and a vitamin B12 level in 3 months.    ______________________________ Samul Dada, M.D. DSM/MEDQ  D:  09/05/2011  T:  09/05/2011  Job:  [id number]

## 2011-09-05 NOTE — Progress Notes (Signed)
This office note has been dictated.  #[id number]

## 2011-09-06 ENCOUNTER — Telehealth: Payer: Self-pay | Admitting: Medical Oncology

## 2011-09-06 ENCOUNTER — Telehealth: Payer: Self-pay | Admitting: Oncology

## 2011-09-06 NOTE — Telephone Encounter (Signed)
pt aware of her  7/11 appt aom

## 2011-09-06 NOTE — Telephone Encounter (Signed)
I called pt per Dr. Arline Asp to let her know that her ferritin and Vit B 12 were low. Per Dr. Arline Asp should needs to be taking Iron 325 mg  2-3 times daily and Vit B 12 1000 mcg daily. He also has ordered lab to recheck her levels in 3 months. She is aware the schedulers will call her with an appointment. She voiced understanding.

## 2011-10-13 ENCOUNTER — Other Ambulatory Visit: Payer: Self-pay | Admitting: Gastroenterology

## 2011-10-13 DIAGNOSIS — R110 Nausea: Secondary | ICD-10-CM | POA: Diagnosis not present

## 2011-10-13 DIAGNOSIS — R1084 Generalized abdominal pain: Secondary | ICD-10-CM | POA: Diagnosis not present

## 2011-10-17 ENCOUNTER — Ambulatory Visit
Admission: RE | Admit: 2011-10-17 | Discharge: 2011-10-17 | Disposition: A | Discharge status: Home / Self Care | Payer: Medicare Other | Source: Ambulatory Visit | Attending: Gastroenterology | Admitting: Gastroenterology

## 2011-10-17 DIAGNOSIS — R110 Nausea: Secondary | ICD-10-CM | POA: Diagnosis not present

## 2011-10-17 DIAGNOSIS — K449 Diaphragmatic hernia without obstruction or gangrene: Secondary | ICD-10-CM | POA: Diagnosis not present

## 2011-10-17 DIAGNOSIS — R109 Unspecified abdominal pain: Secondary | ICD-10-CM | POA: Diagnosis not present

## 2011-10-17 MED ORDER — IOHEXOL 300 MG/ML  SOLN
100.0000 mL | Freq: Once | INTRAMUSCULAR | Status: AC | PRN
  Administered 2011-10-17: 100 mL via INTRAVENOUS

## 2011-10-19 ENCOUNTER — Other Ambulatory Visit: Payer: Self-pay | Admitting: Gastroenterology

## 2011-10-19 DIAGNOSIS — R110 Nausea: Secondary | ICD-10-CM

## 2011-10-25 ENCOUNTER — Other Ambulatory Visit: Payer: Medicare Other

## 2011-11-03 ENCOUNTER — Other Ambulatory Visit: Payer: Self-pay | Admitting: Gastroenterology

## 2011-11-03 ENCOUNTER — Ambulatory Visit
Admission: RE | Admit: 2011-11-03 | Discharge: 2011-11-03 | Disposition: A | Discharge status: Home / Self Care | Payer: Medicare Other | Source: Ambulatory Visit | Attending: Gastroenterology | Admitting: Gastroenterology

## 2011-11-03 DIAGNOSIS — R109 Unspecified abdominal pain: Secondary | ICD-10-CM

## 2011-11-03 DIAGNOSIS — C801 Malignant (primary) neoplasm, unspecified: Secondary | ICD-10-CM | POA: Diagnosis not present

## 2011-11-03 DIAGNOSIS — K6389 Other specified diseases of intestine: Secondary | ICD-10-CM | POA: Diagnosis not present

## 2011-11-03 DIAGNOSIS — R110 Nausea: Secondary | ICD-10-CM

## 2011-12-06 ENCOUNTER — Telehealth: Payer: Self-pay | Admitting: Oncology

## 2011-12-06 NOTE — Telephone Encounter (Signed)
pt called to r/s 7/11 lab to 7/16,done,[place]   aom

## 2011-12-07 ENCOUNTER — Other Ambulatory Visit: Payer: Medicare Other | Admitting: Lab

## 2011-12-12 ENCOUNTER — Other Ambulatory Visit (HOSPITAL_BASED_OUTPATIENT_CLINIC_OR_DEPARTMENT_OTHER): Payer: Medicare Other | Admitting: Lab

## 2011-12-12 DIAGNOSIS — E538 Deficiency of other specified B group vitamins: Secondary | ICD-10-CM | POA: Diagnosis not present

## 2011-12-12 DIAGNOSIS — C241 Malignant neoplasm of ampulla of Vater: Secondary | ICD-10-CM

## 2011-12-12 DIAGNOSIS — Z862 Personal history of diseases of the blood and blood-forming organs and certain disorders involving the immune mechanism: Secondary | ICD-10-CM

## 2011-12-12 LAB — CBC WITH DIFFERENTIAL/PLATELET
BASO%: 0.4 % (ref 0.0–2.0)
HGB: 10.9 g/dL — ABNORMAL LOW (ref 11.6–15.9)
LYMPH%: 14.3 % (ref 14.0–49.7)
MCV: 92.8 fL (ref 79.5–101.0)
MONO#: 0.5 10*3/uL (ref 0.1–0.9)
NEUT#: 5.8 10*3/uL (ref 1.5–6.5)
NEUT%: 78.5 % — ABNORMAL HIGH (ref 38.4–76.8)
RBC: 3.64 10*6/uL — ABNORMAL LOW (ref 3.70–5.45)
RDW: 13.3 % (ref 11.2–14.5)
lymph#: 1.1 10*3/uL (ref 0.9–3.3)

## 2011-12-12 LAB — IRON AND TIBC: %SAT: 18 % — ABNORMAL LOW (ref 20–55)

## 2012-01-05 DIAGNOSIS — F17200 Nicotine dependence, unspecified, uncomplicated: Secondary | ICD-10-CM | POA: Diagnosis not present

## 2012-01-05 DIAGNOSIS — M810 Age-related osteoporosis without current pathological fracture: Secondary | ICD-10-CM | POA: Diagnosis not present

## 2012-01-05 DIAGNOSIS — E782 Mixed hyperlipidemia: Secondary | ICD-10-CM | POA: Diagnosis not present

## 2012-01-05 DIAGNOSIS — F329 Major depressive disorder, single episode, unspecified: Secondary | ICD-10-CM | POA: Diagnosis not present

## 2012-01-05 DIAGNOSIS — Z0000 Encounter for general adult medical examination without abnormal findings: Secondary | ICD-10-CM | POA: Diagnosis not present

## 2012-01-05 DIAGNOSIS — J449 Chronic obstructive pulmonary disease, unspecified: Secondary | ICD-10-CM | POA: Diagnosis not present

## 2012-01-23 ENCOUNTER — Inpatient Hospital Stay (HOSPITAL_COMMUNITY): Admission: RE | Admit: 2012-01-23 | Payer: Medicare Other | Source: Ambulatory Visit

## 2012-02-09 ENCOUNTER — Inpatient Hospital Stay (HOSPITAL_COMMUNITY): Admission: RE | Admit: 2012-02-09 | Payer: Medicare Other | Source: Ambulatory Visit

## 2012-02-14 ENCOUNTER — Telehealth: Payer: Self-pay | Admitting: Oncology

## 2012-02-14 NOTE — Telephone Encounter (Signed)
S.W. Pt and advised of appt change.   sed

## 2012-03-05 ENCOUNTER — Other Ambulatory Visit (HOSPITAL_COMMUNITY): Payer: Self-pay | Admitting: *Deleted

## 2012-03-05 ENCOUNTER — Encounter: Payer: Self-pay | Admitting: Oncology

## 2012-03-05 ENCOUNTER — Ambulatory Visit (HOSPITAL_BASED_OUTPATIENT_CLINIC_OR_DEPARTMENT_OTHER): Payer: Medicare Other | Admitting: Oncology

## 2012-03-05 ENCOUNTER — Telehealth: Payer: Self-pay | Admitting: Oncology

## 2012-03-05 ENCOUNTER — Other Ambulatory Visit (HOSPITAL_BASED_OUTPATIENT_CLINIC_OR_DEPARTMENT_OTHER): Payer: Medicare Other | Admitting: Lab

## 2012-03-05 VITALS — BP 128/65 | HR 63 | Temp 98.0°F | Resp 18 | Ht 63.5 in | Wt 111.5 lb

## 2012-03-05 DIAGNOSIS — C241 Malignant neoplasm of ampulla of Vater: Secondary | ICD-10-CM | POA: Diagnosis not present

## 2012-03-05 DIAGNOSIS — R634 Abnormal weight loss: Secondary | ICD-10-CM

## 2012-03-05 DIAGNOSIS — M810 Age-related osteoporosis without current pathological fracture: Secondary | ICD-10-CM | POA: Diagnosis not present

## 2012-03-05 DIAGNOSIS — Z862 Personal history of diseases of the blood and blood-forming organs and certain disorders involving the immune mechanism: Secondary | ICD-10-CM

## 2012-03-05 DIAGNOSIS — M949 Disorder of cartilage, unspecified: Secondary | ICD-10-CM

## 2012-03-05 DIAGNOSIS — F17200 Nicotine dependence, unspecified, uncomplicated: Secondary | ICD-10-CM

## 2012-03-05 DIAGNOSIS — E538 Deficiency of other specified B group vitamins: Secondary | ICD-10-CM

## 2012-03-05 DIAGNOSIS — M899 Disorder of bone, unspecified: Secondary | ICD-10-CM | POA: Diagnosis not present

## 2012-03-05 LAB — COMPREHENSIVE METABOLIC PANEL (CC13)
ALT: 17 U/L (ref 0–55)
AST: 26 U/L (ref 5–34)
Albumin: 3.5 g/dL (ref 3.5–5.0)
BUN: 10 mg/dL (ref 7.0–26.0)
CO2: 25 mEq/L (ref 22–29)
Calcium: 9.5 mg/dL (ref 8.4–10.4)
Chloride: 102 mEq/L (ref 98–107)
Creatinine: 0.8 mg/dL (ref 0.6–1.1)
Potassium: 4.2 mEq/L (ref 3.5–5.1)

## 2012-03-05 LAB — CBC WITH DIFFERENTIAL/PLATELET
Eosinophils Absolute: 0 10*3/uL (ref 0.0–0.5)
MONO#: 0.4 10*3/uL (ref 0.1–0.9)
NEUT#: 4.8 10*3/uL (ref 1.5–6.5)
Platelets: 180 10*3/uL (ref 145–400)
RBC: 3.84 10*6/uL (ref 3.70–5.45)
RDW: 13.4 % (ref 11.2–14.5)
WBC: 6.4 10*3/uL (ref 3.9–10.3)
lymph#: 1.2 10*3/uL (ref 0.9–3.3)

## 2012-03-05 LAB — VITAMIN B12: Vitamin B-12: 1857 pg/mL — ABNORMAL HIGH (ref 211–911)

## 2012-03-05 LAB — LACTATE DEHYDROGENASE (CC13): LDH: 151 U/L (ref 125–220)

## 2012-03-05 LAB — T3: T3, Total: 102.1 ng/dL (ref 80.0–204.0)

## 2012-03-05 NOTE — Telephone Encounter (Signed)
appts made and printed for pt aom °

## 2012-03-05 NOTE — Patient Instructions (Signed)
Please try to take Boost or other nutritional supplements 3-4 times daily.  Try to regain some of the weight you have lost in recent years.

## 2012-03-05 NOTE — Progress Notes (Signed)
This office note has been dictated.  #[id number]

## 2012-03-05 NOTE — Progress Notes (Signed)
CC:   Shirley Friar, MD Leone Haven, MD Garnet Koyanagi, MD Deirdre Peer. Polite, M.D.  PROBLEM LIST:  1. 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 [name] was treated with combination chemotherapy and  radiation at [facility] and then underwent surgical resection at [facility] on  September 19, 2006. The patient has done fairly well without evidence  of recurrent cancer, remains disease free. The patient received  Xeloda and radiation from mid January of 2008 through mid March of  2008. At the time of surgery, there was involvement of the  duodenal wall and perivascular invasion. There was residual tumor  of 1 cm. Tumor grade was 2. Six lymph nodes were negative.  2. History of depression.  3. History of dyslipidemia.  4. History of vitamin B12 deficiency.  5. History of vitamin D deficiency.  6. Large hiatal hernia.  7. Chronic obstructive pulmonary disease.  8. History of gastroesophageal reflux disease.  9. Compression fracture of L2 diagnosed 02/22/2007 for which she  underwent kyphoplasty on 03/05/2007. She has also undergone a T12  vertebroplasty on 03/12/2007.  10.History of jejunal ulcer with negative biopsies on 08/02/2009.  11.Osteoporosis with most recent bone density scan done on 01/19/2011.  12.History of weight loss.  The patient weighed 183 pounds in 2007 prior to diagnosis of her ampullary cancer.  The patient has lost approximately 70 pounds.  13.History of cigarette smoking. The patient has been urged on  multiple occasions to stop smoking. She does not appear to have  much interest in smoking cessation treatment.   MEDICATIONS: 1. Os-Cal 600 mg daily. 2. Cyanocobalamin 2500 mcg daily. 3. Ferrous sulfate 325 mg twice daily. 4. Prevacid 30 mg daily. 5. Ativan 1 mg every 8 hours as needed. 6. Zoloft 100 mg daily. 7. Ergocalciferol 50,000 units weekly.  The patient has taken this for     8 weeks out of a 10 week course.  SMOKING  HISTORY:  The patient has smoked a pack of cigarettes a day since age 60 and continues to smoke presently.  HISTORY:  I saw Mackenzie Mendoza today for followup of her T3 N0 ampulla of Vater adenocarcinoma dating back to 03/2006.  Ms. Bal remains free of recurrent disease.  She was last seen by [place] on 09/05/2011.  She lives with her husband and daughter.  She does not drive.  Over the past 6 months she has done fairly well.  She was having some abdominal discomfort back in the spring of this year.  She had CT scans and an upper GI with small bowel follow-through that were essentially negative and the patient's symptoms have essentially resolved.  She occasionally has some abdominal discomfort.  She denies any diarrhea.  She denies any symptoms to suggest hyperthyroidism.  The patient does not have much of an appetite and eats poorly.  Unfortunately, she has lost another 4 pounds over the past 6 months.  She continues to smoke and declines my suggestions about smoking cessation.  We have made these suggestions previously.  She also has declined referral to a nutritional specialist. The patient is really without any major complaints today.  Aside from her weight loss she seems to be doing fairly well.  PHYSICAL EXAMINATION:  The patient is an extremely thin woman who does not act sick.  Weight is 111.5 pounds, height 5 feet 3-1/2 inches, body surface area 1.51 m2.  Blood pressure 128/65.  Other vital signs are normal.  There is no  scleral icterus.  Mouth and pharynx are benign. Dentition is poor.  No adenopathy.  Lungs decreased breath sounds, otherwise lungs are clear.  Cardiac exam regular rhythm without murmur or rub.  Breasts are not examined.  The patient is due for mammogram at the present time.  Her last mammogram was on 01/19/2011.  Abdomen is scaphoid, quite thin, nontender with no organomegaly or masses palpable. No masses or ascites.  Extremities:  No peripheral edema.  The  patient has some purpura over her arms.  Neurologic exam is nonfocal.  LABORATORY DATA:  Today, white count 6.4, ANC 4.8, hemoglobin 12.1, hematocrit 35.9, platelets 180,000.  Chemistries today are essentially normal except for an alkaline phosphatase of 192.  Albumin was 3.5, LDH 151.  Pending are CA19-9, vitamin D level, vitamin B12 level and iron studies.  We are also adding thyroid tests specifically T4 total, free T4, T3 and TSH.  On 12/12/2011, vitamin B12 level was 646 and ferritin 34.  On 09/05/2011, vitamin B12 level was 318 with a ferritin of 23, iron saturation of 11%.  CA19-9 was 13.5 on 09/05/2011.  That is within the normal range.  IMAGING STUDIES:  1. Upper GI with small-bowel follow-through on 05/13/2010 showed  abnormality of the small bowel anastomosing with the stomach in  this patient who has undergone a Whipple procedure. Considerations  are that of a nonvisualized ulceration versus local inflammation.  There was no obstruction. There was a moderate-sized hiatal  hernia. It was noted the patient had undergone a  gastrojejunostomy. There was irregularity, mild edema of the  proximal jejunum.  2. CT angiogram of the chest with IV contrast on 08/19/2010 showed  scattered tree-in-bud densities within the lungs, likely small  airway disease/alveolitis. Once again, a large hiatal hernia was  noted. There was no evidence for pulmonary embolism. There was  mild hyperinflation.  3. Bone density scan from 01/18/2010: T-score of the left femoral  neck was -4.2. T-score of the right forearm was -5.9. The patient  had osteoporosis by WHO criteria.  4. Digital screening mammogram on 01/19/2011 was negative. 5. CT scan of abdomen and pelvis with IV contrast on 10/17/2011 showed     no acute abdominal/pelvic findings, mass lesions or     lymphadenopathy.  There was moderate stool throughout the colon and     a large hiatal hernia.  There was moderate to advanced      atherosclerotic changes involving the aorta and branch vessels. 6. Upper GI with small bowel follow-through on 11/03/2011 showed a     moderate hiatal hernia and status post gastrojejunostomy.  There     was felt to be a mild adynamic ileus and esophageal dysmotility.     There was an apparent gastric fold thickening within a hiatal     hernia which was favored to be due to under distention, however,     gastritis could not be completely excluded.   IMPRESSION AND PLAN:  Ms. Rachel is now out 6 years from the time of diagnosis without evidence for recurrent disease.  Unfortunately, she has lost about 70 pounds from her baseline prior to the diagnosis of her cancer.  She has lost another 4 pounds over the past 6 months since her last visit here on 09/05/2011.  I again urged the patient to take in nutritional supplements.  We have had this discussion before.  She declines a referral to a nutritionist and also is not interested in smoking cessation.  Today we are  checking thyroid tests although I do not think the patient is hyperthyroid.  There is a possibility that she could have some element of pancreatic insufficiency although she is not having diarrhea.  Once again, there does not seem to be any evidence of this clinically or radiographically.  The patient does not act sick although she is quite thin.  She is due for mammograms since her last mammograms were on 01/19/2011.  I have asked Ms. Strub to return in 6 months at which time we will check CBC, chemistries, iron studies, another vitamin B12 level, also CA19-9.    ______________________________ Samul Dada, M.D. DSM/MEDQ  D:  03/05/2012  T:  03/05/2012  Job:  [id number]

## 2012-03-06 LAB — VITAMIN D 25 HYDROXY (VIT D DEFICIENCY, FRACTURES): Vit D, 25-Hydroxy: 36 ng/mL (ref 30–89)

## 2012-03-06 LAB — IRON AND TIBC
%SAT: 15 % — ABNORMAL LOW (ref 20–55)
Iron: 52 ug/dL (ref 42–145)

## 2012-03-07 ENCOUNTER — Encounter (HOSPITAL_COMMUNITY): Admission: RE | Admit: 2012-03-07 | Payer: Medicare Other | Source: Ambulatory Visit

## 2012-03-07 ENCOUNTER — Other Ambulatory Visit: Payer: Medicare Other | Admitting: Lab

## 2012-03-07 ENCOUNTER — Ambulatory Visit: Payer: Medicare Other | Admitting: Oncology

## 2012-04-01 ENCOUNTER — Other Ambulatory Visit (HOSPITAL_COMMUNITY): Payer: Self-pay | Admitting: Internal Medicine

## 2012-04-01 DIAGNOSIS — Z1231 Encounter for screening mammogram for malignant neoplasm of breast: Secondary | ICD-10-CM

## 2012-04-19 ENCOUNTER — Ambulatory Visit (HOSPITAL_COMMUNITY): Payer: Medicare Other

## 2012-05-31 ENCOUNTER — Ambulatory Visit (HOSPITAL_COMMUNITY)
Admission: RE | Admit: 2012-05-31 | Discharge: 2012-05-31 | Disposition: A | Discharge status: Home / Self Care | Payer: Medicare Other | Source: Ambulatory Visit | Attending: Internal Medicine | Admitting: Internal Medicine

## 2012-05-31 DIAGNOSIS — Z1231 Encounter for screening mammogram for malignant neoplasm of breast: Secondary | ICD-10-CM | POA: Diagnosis not present

## 2012-07-05 DIAGNOSIS — M810 Age-related osteoporosis without current pathological fracture: Secondary | ICD-10-CM | POA: Diagnosis not present

## 2012-07-05 DIAGNOSIS — F17200 Nicotine dependence, unspecified, uncomplicated: Secondary | ICD-10-CM | POA: Diagnosis not present

## 2012-07-05 DIAGNOSIS — J449 Chronic obstructive pulmonary disease, unspecified: Secondary | ICD-10-CM | POA: Diagnosis not present

## 2012-07-05 DIAGNOSIS — F329 Major depressive disorder, single episode, unspecified: Secondary | ICD-10-CM | POA: Diagnosis not present

## 2012-07-13 ENCOUNTER — Other Ambulatory Visit: Payer: Self-pay

## 2012-08-30 ENCOUNTER — Encounter: Payer: Self-pay | Admitting: Oncology

## 2012-08-30 ENCOUNTER — Other Ambulatory Visit (HOSPITAL_BASED_OUTPATIENT_CLINIC_OR_DEPARTMENT_OTHER): Payer: Medicare Other | Admitting: Lab

## 2012-08-30 ENCOUNTER — Ambulatory Visit (HOSPITAL_BASED_OUTPATIENT_CLINIC_OR_DEPARTMENT_OTHER): Payer: Medicare Other | Admitting: Oncology

## 2012-08-30 VITALS — BP 120/39 | HR 65 | Temp 98.1°F | Resp 20 | Ht 63.5 in | Wt 112.4 lb

## 2012-08-30 DIAGNOSIS — Z862 Personal history of diseases of the blood and blood-forming organs and certain disorders involving the immune mechanism: Secondary | ICD-10-CM | POA: Diagnosis not present

## 2012-08-30 DIAGNOSIS — C241 Malignant neoplasm of ampulla of Vater: Secondary | ICD-10-CM

## 2012-08-30 DIAGNOSIS — E538 Deficiency of other specified B group vitamins: Secondary | ICD-10-CM

## 2012-08-30 DIAGNOSIS — F17200 Nicotine dependence, unspecified, uncomplicated: Secondary | ICD-10-CM | POA: Diagnosis not present

## 2012-08-30 DIAGNOSIS — E559 Vitamin D deficiency, unspecified: Secondary | ICD-10-CM | POA: Diagnosis not present

## 2012-08-30 LAB — IRON AND TIBC
Iron: 42 ug/dL (ref 42–145)
TIBC: 350 ug/dL (ref 250–470)
UIBC: 308 ug/dL (ref 125–400)

## 2012-08-30 LAB — CBC WITH DIFFERENTIAL/PLATELET
Basophils Absolute: 0 10*3/uL (ref 0.0–0.1)
EOS%: 0.6 % (ref 0.0–7.0)
Eosinophils Absolute: 0 10*3/uL (ref 0.0–0.5)
HCT: 35.2 % (ref 34.8–46.6)
HGB: 11.7 g/dL (ref 11.6–15.9)
MCH: 30.2 pg (ref 25.1–34.0)
MCV: 91 fL (ref 79.5–101.0)
MONO%: 5.5 % (ref 0.0–14.0)
NEUT#: 5.2 10*3/uL (ref 1.5–6.5)
NEUT%: 77.1 % — ABNORMAL HIGH (ref 38.4–76.8)
RDW: 13.4 % (ref 11.2–14.5)

## 2012-08-30 LAB — COMPREHENSIVE METABOLIC PANEL (CC13)
BUN: 13.3 mg/dL (ref 7.0–26.0)
CO2: 28 mEq/L (ref 22–29)
Creatinine: 0.9 mg/dL (ref 0.6–1.1)
Glucose: 74 mg/dl (ref 70–99)
Total Bilirubin: 0.29 mg/dL (ref 0.20–1.20)
Total Protein: 7.3 g/dL (ref 6.4–8.3)

## 2012-08-30 LAB — FERRITIN: Ferritin: 54 ng/mL (ref 10–291)

## 2012-08-30 LAB — LACTATE DEHYDROGENASE (CC13): LDH: 152 U/L (ref 125–245)

## 2012-08-30 NOTE — Progress Notes (Signed)
CC:   Mackenzie Mendoza. Polite, M.D. Shirley Friar, MD Leone Haven, MD Garnet Koyanagi, MD  PROBLEM LIST:  1. 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 Mackenzie Mendoza was treated with combination chemotherapy and  radiation at [facility] and then underwent surgical resection at [facility] on  September 19, 2006. The patient has done fairly well without evidence  of recurrent cancer, remains disease free. The patient received  Xeloda and radiation from mid January of 2008 through mid March of  2008. At the time of surgery, there was involvement of the  duodenal wall and perivascular invasion. There was residual tumor  of 1 cm. Tumor grade was 2. Six lymph nodes were negative.  2. History of depression.  3. History of dyslipidemia.  4. History of vitamin B12 deficiency.  5. History of vitamin D deficiency.  6. Large hiatal hernia.  7. Chronic obstructive pulmonary disease.  8. History of gastroesophageal reflux disease.  9. Compression fracture of L2 diagnosed 02/22/2007 for which she  underwent kyphoplasty on 03/05/2007. She has also undergone a T12  vertebroplasty on 03/12/2007.  10.History of jejunal ulcer with negative biopsies on 08/02/2009.  11.Osteoporosis with most recent bone density scan done on 01/19/2011.  12.History of weight loss. The patient weighed 183 pounds in 2007 prior to  diagnosis of her ampullary cancer. The patient has lost approximately  70 pounds.  13.History of cigarette smoking. The patient has been urged on  multiple occasions to stop smoking. She does not appear to have  much interest in smoking cessation treatment.   MEDICATIONS:  Reviewed and recorded. Current Outpatient Prescriptions  Medication Sig Dispense Refill  . acetaminophen (TYLENOL) 500 MG tablet Take 500 mg by mouth every 6 (six) hours as needed.      . calcium carbonate (OS-CAL) 600 MG TABS Take 600 mg by mouth daily.      . Cyanocobalamin (VITAMIN B 12 PO) Take 2,500 mcg by  mouth.       . ferrous sulfate 325 (65 FE) MG tablet Take 325 mg by mouth 2 (two) times daily.      . lansoprazole (PREVACID) 30 MG capsule Take 30 mg by mouth daily.      Marland Kitchen LORazepam (ATIVAN) 1 MG tablet Take 1 mg by mouth every 8 (eight) hours as needed.      . sertraline (ZOLOFT) 100 MG tablet Take 100 mg by mouth daily.      . Vitamin D, Ergocalciferol, (DRISDOL) 50000 UNITS CAPS Take 50,000 Units by mouth every 7 (seven) days.       No current facility-administered medications for this visit.    SMOKING HISTORY: The patient has smoked a pack of cigarettes a day  since age 18 and continues to smoke presently.   HISTORY:  Mackenzie Mendoza was seen today for followup of her T3 N0 ampulla Vater adenocarcinoma dating back to November 2007.  Mackenzie Mendoza remains free of disease.  She was last seen by [place] on 03/05/2012.  There has been no significant change in her condition.  She denies any pain or respiratory symptomatology.  She continues to smoke a pack of cigarettes a day.  She has not had any health issues over the past 6 months.  PHYSICAL EXAMINATION:  The patient looks frail and thin.  There has been no obvious weight loss.  Weight is 112 pounds 6.4 ounces.  Height 5 feet 3-1/2 inches.  Body surface area 1.51 sq m.  Blood pressure today  is recorded 120/39.  In the past the patient has had a normal diastolic blood pressure.  Other vital signs are normal.  Pulse is 65 and regular. O2 saturation on room air at rest was 97%.  There is no scleral icterus. Mouth and pharynx benign.  Dentition is poor.  No adenopathy.  The patient fairly prominent inspiratory and expiratory crackles over the left lung both anterior and posterior with some expiratory wheezing. She has similar changes but less marked on the right.  This is a new finding.  Cardiac exam regular rhythm without murmur or rub.  Breasts are not examined.  She did have a mammogram on 05/31/2012 that was negative.  No axillary  adenopathy.  Abdomen is somewhat doughy, nontender with no organomegaly or masses palpable.  Back, there is a dorsal kyphosis.  Extremities, no peripheral edema.  Neurologic exam was normal.  LABORATORY DATA:  Today, CBC was normal.  Hemoglobin was 11.7, hematocrit 35.2.  Chemistries notable for an albumin of 3.1.  BUN 13, creatinine 0.9.  LDH 152.  On 03/05/2012 albumin was 3.5 and on 09/05/2011 albumin was 4.2.  Vitamin B12 level was 1857.  Vitamin D 36, ferritin 65 and thyroid function tests were normal going back to 03/05/2012.  IMAGING STUDIES:  1. Upper GI with small-bowel follow-through on 05/13/2010 showed  abnormality of the small bowel anastomosing with the stomach in  this patient who has undergone a Whipple procedure. Considerations  are that of a nonvisualized ulceration versus local inflammation.  There was no obstruction. There was a moderate-sized hiatal  hernia. It was noted the patient had undergone a  gastrojejunostomy. There was irregularity, mild edema of the  proximal jejunum.  2. CT angiogram of the chest with IV contrast on 08/19/2010 showed  scattered tree-in-bud densities within the lungs, likely small  airway disease/alveolitis. Once again, a large hiatal hernia was  noted. There was no evidence for pulmonary embolism. There was  mild hyperinflation.  3. Bone density scan from 01/18/2010: T-score of the left femoral  neck was -4.2. T-score of the right forearm was -5.9. The patient  had osteoporosis by WHO criteria.  4. Digital screening mammogram on 01/19/2011 was negative.  5. CT scan of abdomen and pelvis with IV contrast on 10/17/2011 showed  no acute abdominal/pelvic findings, mass lesions or  lymphadenopathy. There was moderate stool throughout the colon and  a large hiatal hernia. There was moderate to advanced  atherosclerotic changes involving the aorta and branch vessels.  6. Upper GI with small bowel follow-through on 11/03/2011 showed a   moderate hiatal hernia and status post gastrojejunostomy. There  was felt to be a mild adynamic ileus and esophageal dysmotility.  There was an apparent gastric fold thickening within a hiatal  hernia which was favored to be due to under distention, however,  gastritis could not be completely excluded. 7. Digital bilateral screening mammogram on 05/31/2012 was negative.   IMPRESSION AND PLAN:  Mackenzie Mendoza continues to do fairly well, now 6-1/2 years from the time of diagnosis.  Her condition remains stable.  She continues to smoke despite our attempts to encourage smoking cessation.  Of concern today is the exam of the lungs, particularly the left lung. I am going to go ahead and get a chest x-ray.  The patient did have a CT angiogram of the chest on 08/19/2010 which was negative except for evidence of small airway disease/alveolitis.  I told the patient that it was not necessary for her to be  followed by [place] at this point.  The patient was somewhat ambivalent and therefore we went ahead and made an appointment.  If she decides she does not want to keep it then she can cancel the appointment.  As stated, will have a chest x-ray today.  An appointment was made for 6 months at which time we will check CBC and chemistries.  I should mention that CA19-9 on 03/05/2012 was 10.7.  The patient's weight is stable.    ______________________________ Samul Dada, M.D. DSM/MEDQ  D:  08/30/2012  T:  08/30/2012  Job:  [id number]

## 2012-08-30 NOTE — Progress Notes (Signed)
This office note has been dictated.  #[id number]

## 2012-09-02 ENCOUNTER — Telehealth: Payer: Self-pay | Admitting: Oncology

## 2012-10-18 DIAGNOSIS — M549 Dorsalgia, unspecified: Secondary | ICD-10-CM | POA: Diagnosis not present

## 2012-10-18 DIAGNOSIS — R071 Chest pain on breathing: Secondary | ICD-10-CM | POA: Diagnosis not present

## 2013-01-01 ENCOUNTER — Other Ambulatory Visit: Payer: Self-pay

## 2013-03-06 ENCOUNTER — Other Ambulatory Visit: Payer: Self-pay | Admitting: Internal Medicine

## 2013-03-06 DIAGNOSIS — C241 Malignant neoplasm of ampulla of Vater: Secondary | ICD-10-CM

## 2013-03-07 ENCOUNTER — Telehealth: Payer: Self-pay

## 2013-03-07 ENCOUNTER — Other Ambulatory Visit: Payer: Medicare Other | Admitting: Lab

## 2013-03-07 ENCOUNTER — Ambulatory Visit: Payer: Medicare Other

## 2013-03-07 NOTE — Telephone Encounter (Signed)
S/w pt about appt today. She states she called several days ago to cancel. According to Dr Murinson's last progress note, she could cancel the appt and have her care completed here. She states that is her intention. Told her to call if we can help her in the future.

## 2013-04-03 ENCOUNTER — Other Ambulatory Visit: Payer: Self-pay

## 2013-05-08 DIAGNOSIS — F17200 Nicotine dependence, unspecified, uncomplicated: Secondary | ICD-10-CM | POA: Diagnosis not present

## 2013-05-08 DIAGNOSIS — R109 Unspecified abdominal pain: Secondary | ICD-10-CM | POA: Diagnosis not present

## 2013-05-08 DIAGNOSIS — R110 Nausea: Secondary | ICD-10-CM | POA: Diagnosis not present

## 2013-08-20 DIAGNOSIS — K219 Gastro-esophageal reflux disease without esophagitis: Secondary | ICD-10-CM | POA: Diagnosis not present

## 2013-08-20 DIAGNOSIS — Z23 Encounter for immunization: Secondary | ICD-10-CM | POA: Diagnosis not present

## 2013-08-20 DIAGNOSIS — F17200 Nicotine dependence, unspecified, uncomplicated: Secondary | ICD-10-CM | POA: Diagnosis not present

## 2013-08-20 DIAGNOSIS — M810 Age-related osteoporosis without current pathological fracture: Secondary | ICD-10-CM | POA: Diagnosis not present

## 2013-08-20 DIAGNOSIS — E559 Vitamin D deficiency, unspecified: Secondary | ICD-10-CM | POA: Diagnosis not present

## 2013-08-20 DIAGNOSIS — J449 Chronic obstructive pulmonary disease, unspecified: Secondary | ICD-10-CM | POA: Diagnosis not present

## 2013-08-20 DIAGNOSIS — F329 Major depressive disorder, single episode, unspecified: Secondary | ICD-10-CM | POA: Diagnosis not present

## 2013-09-11 ENCOUNTER — Other Ambulatory Visit (HOSPITAL_COMMUNITY): Payer: Self-pay | Admitting: *Deleted

## 2013-09-12 ENCOUNTER — Ambulatory Visit (HOSPITAL_COMMUNITY)
Admission: RE | Admit: 2013-09-12 | Discharge: 2013-09-12 | Disposition: A | Discharge status: Home / Self Care | Payer: Medicare Other | Source: Ambulatory Visit | Attending: Internal Medicine | Admitting: Internal Medicine

## 2013-09-12 DIAGNOSIS — Z5181 Encounter for therapeutic drug level monitoring: Secondary | ICD-10-CM | POA: Diagnosis not present

## 2013-09-12 DIAGNOSIS — M810 Age-related osteoporosis without current pathological fracture: Secondary | ICD-10-CM | POA: Diagnosis not present

## 2013-09-12 MED ORDER — ZOLEDRONIC ACID 5 MG/100ML IV SOLN
INTRAVENOUS | Status: AC
  Administered 2013-09-12: 5 mg
  Filled 2013-09-12: qty 100

## 2013-09-12 MED ORDER — ZOLEDRONIC ACID 5 MG/100ML IV SOLN: 5.0000 mg | Freq: Once | INTRAVENOUS | Status: DC

## 2013-11-14 ENCOUNTER — Encounter (HOSPITAL_COMMUNITY): Payer: Self-pay | Admitting: Emergency Medicine

## 2013-11-14 ENCOUNTER — Emergency Department (HOSPITAL_COMMUNITY): Payer: Medicare Other

## 2013-11-14 ENCOUNTER — Emergency Department (HOSPITAL_COMMUNITY)
Admission: EM | Admit: 2013-11-14 | Discharge: 2013-11-15 | Disposition: A | Discharge status: Home / Self Care | Payer: Medicare Other | Attending: Emergency Medicine | Admitting: Emergency Medicine

## 2013-11-14 DIAGNOSIS — Z79899 Other long term (current) drug therapy: Secondary | ICD-10-CM | POA: Insufficient documentation

## 2013-11-14 DIAGNOSIS — Y929 Unspecified place or not applicable: Secondary | ICD-10-CM | POA: Insufficient documentation

## 2013-11-14 DIAGNOSIS — F17200 Nicotine dependence, unspecified, uncomplicated: Secondary | ICD-10-CM | POA: Insufficient documentation

## 2013-11-14 DIAGNOSIS — M79609 Pain in unspecified limb: Secondary | ICD-10-CM | POA: Diagnosis not present

## 2013-11-14 DIAGNOSIS — S7000XA Contusion of unspecified hip, initial encounter: Secondary | ICD-10-CM | POA: Diagnosis not present

## 2013-11-14 DIAGNOSIS — Z859 Personal history of malignant neoplasm, unspecified: Secondary | ICD-10-CM | POA: Diagnosis not present

## 2013-11-14 DIAGNOSIS — W19XXXA Unspecified fall, initial encounter: Secondary | ICD-10-CM

## 2013-11-14 DIAGNOSIS — S79919A Unspecified injury of unspecified hip, initial encounter: Secondary | ICD-10-CM | POA: Diagnosis not present

## 2013-11-14 DIAGNOSIS — R51 Headache: Secondary | ICD-10-CM | POA: Diagnosis not present

## 2013-11-14 DIAGNOSIS — R296 Repeated falls: Secondary | ICD-10-CM | POA: Insufficient documentation

## 2013-11-14 DIAGNOSIS — S7001XA Contusion of right hip, initial encounter: Secondary | ICD-10-CM

## 2013-11-14 DIAGNOSIS — Y93K1 Activity, walking an animal: Secondary | ICD-10-CM | POA: Insufficient documentation

## 2013-11-14 DIAGNOSIS — M25559 Pain in unspecified hip: Secondary | ICD-10-CM | POA: Diagnosis not present

## 2013-11-14 DIAGNOSIS — S0990XA Unspecified injury of head, initial encounter: Secondary | ICD-10-CM | POA: Diagnosis not present

## 2013-11-14 MED ORDER — TRAMADOL HCL 50 MG PO TABS
50.0000 mg | ORAL_TABLET | Freq: Once | ORAL | Status: AC
  Administered 2013-11-15: 50 mg via ORAL
  Filled 2013-11-14: qty 1

## 2013-11-14 MED ORDER — TRAMADOL HCL 50 MG PO TABS: 50.0000 mg | ORAL_TABLET | Freq: Four times a day (QID) | ORAL | Status: DC | PRN

## 2013-11-14 NOTE — ED Notes (Signed)
Pt states that she was walking her dog and got tangled in the leash and pulled to the ground.  Pt states she landed in grass and denies and LOC, head, neck or back pain.  Pt states pain in right leg from her hip to her knee that is worst with weight bearing, but not palpation.  No obvious deformity noted

## 2013-11-14 NOTE — ED Notes (Signed)
Pt assisted with assistance with primary rn. Pt walked a few steps and started complaining of right hip pain. Pt assisted back to bed without incident.

## 2013-11-15 NOTE — Discharge Instructions (Signed)
CAT scan of your right hip and head without any acute findings. X-ray of your right femur without any injuries. Most likely soft tissue injuries. Take pain medicine as needed. Rest relax at home. Followup with your doctor if not improving come Monday or Tuesday. Return for any new or worse symptoms

## 2013-11-26 NOTE — ED Provider Notes (Signed)
CSN: [id number]     Arrival date & time 11/14/13  2001 History   First MD Initiated Contact with Patient 11/14/13 2111     Chief Complaint  Patient presents with  . Fall  . Leg Pain     (Consider location/radiation/quality/duration/timing/severity/associated sxs/prior Treatment) Patient is a 80 y.o. female presenting with fall and leg pain. The history is provided by the patient and the EMS personnel.  Fall Pertinent negatives include no chest pain, no abdominal pain, no headaches and no shortness of breath.  Leg Pain Associated symptoms: no back pain, no fatigue and no fever   Patient was walking dog and got tangled in leash and was pulled to ground. No LOC Denies head injury but did go down hard. C/O right leg mostly hip pain. Worse with weight bearing. Pain 8/10. Sharp in nature. She landed in the grass.  Past Medical History  Diagnosis Date  . Cancer    Past Surgical History  Procedure Laterality Date  . Abdominal hysterectomy    . Cholecystectomy     No family history on file. History  Substance Use Topics  . Smoking status: Current Every Day Smoker    Types: Cigarettes  . Smokeless tobacco: Not on file  . Alcohol Use: No   OB History   Grav Para Term Preterm Abortions TAB SAB Ect Mult Living                 Review of Systems  Constitutional: Negative for fever and fatigue.  HENT: Negative for congestion.   Eyes: Negative for visual disturbance.  Respiratory: Negative for shortness of breath.   Cardiovascular: Negative for chest pain.  Gastrointestinal: Negative for nausea, vomiting and abdominal pain.  Genitourinary: Negative for dysuria.  Musculoskeletal: Negative for back pain.  Skin: Negative for rash.  Neurological: Negative for headaches.  Hematological: Does not bruise/bleed easily.  Psychiatric/Behavioral: Negative for confusion.      Allergies  Aspirin; Codeine-promethazine; Estrogens; and Phenobarbital  Home Medications   Prior to  Admission medications   Medication Sig Start Date End Date Taking? Authorizing Provider  acetaminophen (TYLENOL) 500 MG tablet Take 500 mg by mouth every 6 (six) hours as needed.   Yes Historical Provider, MD  calcium carbonate (OS-CAL) 600 MG TABS Take 600 mg by mouth daily.   Yes Historical Provider, MD  Cyanocobalamin (VITAMIN B 12 PO) Take 2,500 mcg by mouth.    Yes Historical Provider, MD  ferrous sulfate 325 (65 FE) MG tablet Take 325 mg by mouth 2 (two) times daily.   Yes Historical Provider, MD  lansoprazole (PREVACID) 30 MG capsule Take 30 mg by mouth daily.   Yes Historical Provider, MD  LORazepam (ATIVAN) 1 MG tablet Take 1 mg by mouth every 8 (eight) hours as needed.   Yes Historical Provider, MD  sertraline (ZOLOFT) 100 MG tablet Take 100 mg by mouth daily.   Yes Historical Provider, MD  traMADol (ULTRAM) 50 MG tablet Take 1 tablet (50 mg total) by mouth every 6 (six) hours as needed. 11/14/13   Fredia Sorrow, MD   BP 132/74  Pulse 75  Temp(Src) 98.2 F (36.8 C) (Oral)  Resp 16  Ht 5\' 4"  (1.626 m)  Wt 115 lb (52.164 kg)  BMI 19.73 kg/m2  SpO2 99% Physical Exam  Nursing note and vitals reviewed. Constitutional: She is oriented to person, place, and time. She appears well-developed and well-nourished. No distress.  HENT:  Head: Normocephalic and atraumatic.  Eyes: Conjunctivae and EOM are  normal. Pupils are equal, round, and reactive to light.  Neck: Normal range of motion.  Cardiovascular: Normal rate, regular rhythm and normal heart sounds.   No murmur heard. Pulmonary/Chest: Effort normal and breath sounds normal. No respiratory distress.  Abdominal: Soft. Bowel sounds are normal. There is no tenderness.  Musculoskeletal: Normal range of motion. She exhibits tenderness.  No deformity, tender right hip. Knee no swelling, no tenderness.   Neurological: She is alert and oriented to person, place, and time. No cranial nerve deficit. She exhibits normal muscle tone.  Coordination normal.  Skin: Skin is warm. No rash noted.    ED Course  Procedures (including critical care time) Labs Review Labs Reviewed - No data to display Results for orders placed in visit on 08/30/12  CBC WITH DIFFERENTIAL      Result Value Ref Range   WBC 6.7  3.9 - 10.3 10e3/uL   NEUT# 5.2  1.5 - 6.5 10e3/uL   HGB 11.7  11.6 - 15.9 g/dL   HCT 35.2  34.8 - 46.6 %   Platelets 182  145 - 400 10e3/uL   MCV 91.0  79.5 - 101.0 fL   MCH 30.2  25.1 - 34.0 pg   MCHC 33.2  31.5 - 36.0 g/dL   RBC 3.87  3.70 - 5.45 10e6/uL   RDW 13.4  11.2 - 14.5 %   lymph# 1.1  0.9 - 3.3 10e3/uL   MONO# 0.4  0.1 - 0.9 10e3/uL   Eosinophils Absolute 0.0  0.0 - 0.5 10e3/uL   Basophils Absolute 0.0  0.0 - 0.1 10e3/uL   NEUT% 77.1 (*) 38.4 - 76.8 %   LYMPH% 16.2  14.0 - 49.7 %   MONO% 5.5  0.0 - 14.0 %   EOS% 0.6  0.0 - 7.0 %   BASO% 0.6  0.0 - 2.0 %  COMPREHENSIVE METABOLIC PANEL (BM84)      Result Value Ref Range   Sodium 139  136 - 145 mEq/L   Potassium 4.3  3.5 - 5.1 mEq/L   Chloride 103  98 - 107 mEq/L   CO2 28  22 - 29 mEq/L   Glucose 74  70 - 99 mg/dl   BUN 13.3  7.0 - 26.0 mg/dL   Creatinine 0.9  0.6 - 1.1 mg/dL   Total Bilirubin 0.29  0.20 - 1.20 mg/dL   Alkaline Phosphatase 109  40 - 150 U/L   AST 16  5 - 34 U/L   ALT 10  0 - 55 U/L   Total Protein 7.3  6.4 - 8.3 g/dL   Albumin 3.1 (*) 3.5 - 5.0 g/dL   Calcium 9.2  8.4 - 10.4 mg/dL  LACTATE DEHYDROGENASE (CC13)      Result Value Ref Range   LDH 152  125 - 245 U/L  FERRITIN      Result Value Ref Range   Ferritin 54  10 - 291 ng/mL  IRON AND TIBC      Result Value Ref Range   Iron 42  42 - 145 ug/dL   UIBC 308  125 - 400 ug/dL   TIBC 350  250 - 470 ug/dL   %SAT 12 (*) 20 - 55 %  VITAMIN B12      Result Value Ref Range   Vitamin B-12 740  211 - 911 pg/mL  CANCER ANTIGEN 19-9      Result Value Ref Range   CA 19-9 9.6  <35.0 U/mL   Dg Femur Right  11/14/2013   CLINICAL DATA:  [place] [address] walking dog after patient at angle in  the leash, RIGHT leg pain from hip to knee  EXAM: RIGHT FEMUR - 2 VIEW  COMPARISON:  None  FINDINGS: Marked osseous demineralization.  Mild knee joint space narrowing.  Hip joint space preserved.  Visualized RIGHT pelvis intact.  No acute fracture, dislocation or bone destruction.  IMPRESSION: Osseous demineralization.  No acute bony abnormalities.   Electronically Signed   By: Lavonia Dana M.D.   On: 11/14/2013 20:38   Ct Head Wo Contrast  11/14/2013   CLINICAL DATA:  Fall, neck and head pain  EXAM: CT HEAD WITHOUT CONTRAST  TECHNIQUE: Contiguous axial images were obtained from the base of the skull through the vertex without intravenous contrast.  COMPARISON:  None.  FINDINGS: No intracranial hemorrhage. No parenchymal contusion. No midline shift or mass effect. Basilar cisterns are patent. No skull base fracture. No fluid in the paranasal sinuses or mastoid air cells. Orbits are normal.  There is generalized cortical atrophy and mild periventricular subcortical white matter hypodensities.  IMPRESSION: 1. No acute intracranial trauma. 2. Atrophy and mild microvascular disease.   Electronically Signed   By: Suzy Bouchard M.D.   On: 11/14/2013 23:21   Ct Hip Right Wo Contrast  11/14/2013   CLINICAL DATA:  Status post fall; right leg and hip pain.  EXAM: CT OF THE RIGHT HIP WITHOUT CONTRAST  TECHNIQUE: Multidetector CT imaging was performed according to the standard protocol. Multiplanar CT image reconstructions were also generated.  COMPARISON:  CT of the abdomen and pelvis performed 10/17/2011  FINDINGS: There is no evidence of fracture or dislocation. The proximal right femur appears intact. The right femoral head remains seated at the acetabulum. The right sacroiliac joint demonstrates mild vacuum phenomenon, but is otherwise unremarkable. No significant degenerative change is seen.  No significant soft tissue injury is characterized. The underlying musculature is unremarkable in appearance. No inguinal  lymphadenopathy is seen on the right side. The visualized portions of the small and large bowel are grossly unremarkable. The bladder is mildly distended; visualized portions are within normal limits. The patient is status post hysterectomy. Mild scattered vascular calcifications are seen.  IMPRESSION: No evidence of fracture or dislocation.   Electronically Signed   By: Garald Balding M.D.   On: 11/14/2013 23:19     Imaging Review No results found.   EKG Interpretation None      MDM   Final diagnoses:  Fall, initial encounter  Contusion, hip, right, initial encounter    CT Head without intracranial injury or fracture. CT Hip done since Xray negative but CT hip negative as well. No significant injury identified from fall. Will Rx with Tramadol and follow up.     Fredia Sorrow, MD 11/26/13 2234

## 2013-12-26 DIAGNOSIS — E559 Vitamin D deficiency, unspecified: Secondary | ICD-10-CM | POA: Diagnosis not present

## 2014-02-24 DIAGNOSIS — Z23 Encounter for immunization: Secondary | ICD-10-CM | POA: Diagnosis not present

## 2014-02-24 DIAGNOSIS — IMO0002 Reserved for concepts with insufficient information to code with codable children: Secondary | ICD-10-CM | POA: Diagnosis not present

## 2014-02-24 DIAGNOSIS — Z1331 Encounter for screening for depression: Secondary | ICD-10-CM | POA: Diagnosis not present

## 2014-02-24 DIAGNOSIS — Z0000 Encounter for general adult medical examination without abnormal findings: Secondary | ICD-10-CM | POA: Diagnosis not present

## 2014-02-24 DIAGNOSIS — J449 Chronic obstructive pulmonary disease, unspecified: Secondary | ICD-10-CM | POA: Diagnosis not present

## 2014-08-11 ENCOUNTER — Emergency Department (HOSPITAL_COMMUNITY)
Admission: EM | Admit: 2014-08-11 | Discharge: 2014-08-12 | Disposition: A | Discharge status: Home / Self Care | Payer: Medicare Other | Attending: Emergency Medicine | Admitting: Emergency Medicine

## 2014-08-11 ENCOUNTER — Encounter (HOSPITAL_COMMUNITY): Payer: Self-pay | Admitting: *Deleted

## 2014-08-11 ENCOUNTER — Emergency Department (HOSPITAL_COMMUNITY): Payer: Medicare Other

## 2014-08-11 DIAGNOSIS — Z85068 Personal history of other malignant neoplasm of small intestine: Secondary | ICD-10-CM | POA: Diagnosis not present

## 2014-08-11 DIAGNOSIS — K7689 Other specified diseases of liver: Secondary | ICD-10-CM | POA: Diagnosis not present

## 2014-08-11 DIAGNOSIS — K449 Diaphragmatic hernia without obstruction or gangrene: Secondary | ICD-10-CM | POA: Diagnosis not present

## 2014-08-11 DIAGNOSIS — R109 Unspecified abdominal pain: Secondary | ICD-10-CM | POA: Diagnosis present

## 2014-08-11 DIAGNOSIS — Z8505 Personal history of malignant neoplasm of liver: Secondary | ICD-10-CM | POA: Insufficient documentation

## 2014-08-11 DIAGNOSIS — N390 Urinary tract infection, site not specified: Secondary | ICD-10-CM | POA: Diagnosis not present

## 2014-08-11 DIAGNOSIS — R531 Weakness: Secondary | ICD-10-CM | POA: Diagnosis not present

## 2014-08-11 DIAGNOSIS — Z79899 Other long term (current) drug therapy: Secondary | ICD-10-CM | POA: Diagnosis not present

## 2014-08-11 DIAGNOSIS — R1033 Periumbilical pain: Secondary | ICD-10-CM | POA: Insufficient documentation

## 2014-08-11 DIAGNOSIS — Z720 Tobacco use: Secondary | ICD-10-CM | POA: Diagnosis not present

## 2014-08-11 DIAGNOSIS — B9689 Other specified bacterial agents as the cause of diseases classified elsewhere: Secondary | ICD-10-CM | POA: Diagnosis not present

## 2014-08-11 LAB — URINALYSIS, ROUTINE W REFLEX MICROSCOPIC
Bilirubin Urine: NEGATIVE
Glucose, UA: NEGATIVE mg/dL
KETONES UR: 40 mg/dL — AB
Nitrite: NEGATIVE
PROTEIN: NEGATIVE mg/dL
Specific Gravity, Urine: 1.039 — ABNORMAL HIGH (ref 1.005–1.030)
UROBILINOGEN UA: 1 mg/dL (ref 0.0–1.0)
pH: 5.5 (ref 5.0–8.0)

## 2014-08-11 LAB — COMPREHENSIVE METABOLIC PANEL
ALT: 14 U/L (ref 0–35)
ANION GAP: 8 (ref 5–15)
AST: 21 U/L (ref 0–37)
Albumin: 3.1 g/dL — ABNORMAL LOW (ref 3.5–5.2)
Alkaline Phosphatase: 90 U/L (ref 39–117)
BILIRUBIN TOTAL: 0.4 mg/dL (ref 0.3–1.2)
BUN: 15 mg/dL (ref 6–23)
CALCIUM: 8 mg/dL — AB (ref 8.4–10.5)
CHLORIDE: 99 mmol/L (ref 96–112)
CO2: 24 mmol/L (ref 19–32)
Creatinine, Ser: 0.79 mg/dL (ref 0.50–1.10)
GFR calc Af Amer: >90 mL/min (ref >90)
GFR, EST NON AFRICAN AMERICAN: 78 mL/min — AB (ref >90)
Glucose, Bld: 105 mg/dL — ABNORMAL HIGH (ref 70–99)
POTASSIUM: 3.6 mmol/L (ref 3.5–5.1)
SODIUM: 131 mmol/L — AB (ref 135–145)
Total Protein: 7 g/dL (ref 6.0–8.3)

## 2014-08-11 LAB — CBC WITH DIFFERENTIAL/PLATELET
Basophils Absolute: 0 10*3/uL (ref 0.0–0.1)
Basophils Relative: 0 % (ref 0–1)
Eosinophils Absolute: 0 10*3/uL (ref 0.0–0.7)
Eosinophils Relative: 0 % (ref 0–5)
HCT: 34.1 % — ABNORMAL LOW (ref 36.0–46.0)
HEMOGLOBIN: 11 g/dL — AB (ref 12.0–15.0)
LYMPHS PCT: 8 % — AB (ref 12–46)
Lymphs Abs: 0.8 10*3/uL (ref 0.7–4.0)
MCH: 28.6 pg (ref 26.0–34.0)
MCHC: 32.3 g/dL (ref 30.0–36.0)
MCV: 88.8 fL (ref 78.0–100.0)
MONOS PCT: 7 % (ref 3–12)
Monocytes Absolute: 0.7 10*3/uL (ref 0.1–1.0)
NEUTROS ABS: 8.4 10*3/uL — AB (ref 1.7–7.7)
NEUTROS PCT: 85 % — AB (ref 43–77)
PLATELETS: 163 10*3/uL (ref 150–400)
RBC: 3.84 MIL/uL — ABNORMAL LOW (ref 3.87–5.11)
RDW: 13.2 % (ref 11.5–15.5)
WBC: 9.9 10*3/uL (ref 4.0–10.5)

## 2014-08-11 LAB — URINE MICROSCOPIC-ADD ON

## 2014-08-11 LAB — LIPASE, BLOOD: Lipase: 10 U/L — ABNORMAL LOW (ref 11–59)

## 2014-08-11 MED ORDER — IOHEXOL 300 MG/ML  SOLN
100.0000 mL | Freq: Once | INTRAMUSCULAR | Status: AC | PRN
  Administered 2014-08-11: 100 mL via INTRAVENOUS

## 2014-08-11 MED ORDER — FENTANYL CITRATE 0.05 MG/ML IJ SOLN
50.0000 ug | Freq: Once | INTRAMUSCULAR | Status: AC
  Administered 2014-08-11: 50 ug via INTRAVENOUS
  Filled 2014-08-11: qty 2

## 2014-08-11 MED ORDER — HYDROCODONE-ACETAMINOPHEN 5-325 MG PO TABS: 1.0000 | ORAL_TABLET | Freq: Four times a day (QID) | ORAL | Status: DC | PRN

## 2014-08-11 MED ORDER — CEPHALEXIN 500 MG PO CAPS: 500.0000 mg | ORAL_CAPSULE | Freq: Four times a day (QID) | ORAL | Status: DC

## 2014-08-11 MED ORDER — IOHEXOL 300 MG/ML  SOLN: 50.0000 mL | Freq: Once | INTRAMUSCULAR | Status: AC | PRN

## 2014-08-11 MED ORDER — ONDANSETRON HCL 4 MG/2ML IJ SOLN
4.0000 mg | Freq: Once | INTRAMUSCULAR | Status: AC
  Administered 2014-08-11: 4 mg via INTRAVENOUS
  Filled 2014-08-11: qty 2

## 2014-08-11 NOTE — ED Notes (Signed)
Pt c/o abd pain for 2 weeks that has gotten progressively worse; pt states that she has vomiting intermittently; pt reports no appetite and decreased intake; pt c/o generalized weakness; pt states "I have had fevers and just feel sour"; pt afebrile in triage

## 2014-08-11 NOTE — Discharge Instructions (Signed)
Abdominal Pain °Many things can cause abdominal pain. Usually, abdominal pain is not caused by a disease and will improve without treatment. It can often be observed and treated at home. Your health care provider will do a physical exam and possibly order blood tests and X-rays to help determine the seriousness of your pain. However, in many cases, more time must pass before a clear cause of the pain can be found. Before that point, your health care provider may not know if you need more testing or further treatment. °HOME CARE INSTRUCTIONS  °Monitor your abdominal pain for any changes. The following actions may help to alleviate any discomfort you are experiencing: °· Only take over-the-counter or prescription medicines as directed by your health care provider. °· Do not take laxatives unless directed to do so by your health care provider. °· Try a clear liquid diet (broth, tea, or water) as directed by your health care provider. Slowly move to a bland diet as tolerated. °SEEK MEDICAL CARE IF: °· You have unexplained abdominal pain. °· You have abdominal pain associated with nausea or diarrhea. °· You have pain when you urinate or have a bowel movement. °· You experience abdominal pain that wakes you in the night. °· You have abdominal pain that is worsened or improved by eating food. °· You have abdominal pain that is worsened with eating fatty foods. °· You have a fever. °SEEK IMMEDIATE MEDICAL CARE IF:  °· Your pain does not go away within 2 hours. °· You keep throwing up (vomiting). °· Your pain is felt only in portions of the abdomen, such as the right side or the left lower portion of the abdomen. °· You pass bloody or black tarry stools. °MAKE SURE YOU: °· Understand these instructions.   °· Will watch your condition.   °· Will get help right away if you are not doing well or get worse.   °Document Released: 02/22/2005 Document Revised: 05/20/2013 Document Reviewed: 01/22/2013 °[employer]® Patient Information  ©2015 [employer]. This information is not intended to replace advice given to you by your health care provider. Make sure you discuss any questions you have with your health care provider. °Urinary Tract Infection °Urinary tract infections (UTIs) can develop anywhere along your urinary tract. Your urinary tract is your body's drainage system for removing wastes and extra water. Your urinary tract includes two kidneys, two ureters, a bladder, and a urethra. Your kidneys are a pair of bean-shaped organs. Each kidney is about the size of your fist. They are located below your ribs, one on each side of your spine. °CAUSES °Infections are caused by microbes, which are microscopic organisms, including fungi, viruses, and bacteria. These organisms are so small that they can only be seen through a microscope. Bacteria are the microbes that most commonly cause UTIs. °SYMPTOMS  °Symptoms of UTIs may vary by age and gender of the patient and by the location of the infection. Symptoms in young women typically include a frequent and intense urge to urinate and a painful, burning feeling in the bladder or urethra during urination. Older women and men are more likely to be tired, shaky, and weak and have muscle aches and abdominal pain. A fever may mean the infection is in your kidneys. Other symptoms of a kidney infection include pain in your back or sides below the ribs, nausea, and vomiting. °DIAGNOSIS °To diagnose a UTI, your caregiver will ask you about your symptoms. Your caregiver also will ask to provide a urine sample. The urine sample   will be tested for bacteria and white blood cells. White blood cells are made by your body to help fight infection. °TREATMENT  °Typically, UTIs can be treated with medication. Because most UTIs are caused by a bacterial infection, they usually can be treated with the use of antibiotics. The choice of antibiotic and length of treatment depend on your symptoms and the type of bacteria  causing your infection. °HOME CARE INSTRUCTIONS °· If you were prescribed antibiotics, take them exactly as your caregiver instructs you. Finish the medication even if you feel better after you have only taken some of the medication. °· Drink enough water and fluids to keep your urine clear or pale yellow. °· Avoid caffeine, tea, and carbonated beverages. They tend to irritate your bladder. °· Empty your bladder often. Avoid holding urine for long periods of time. °· Empty your bladder before and after sexual intercourse. °· After a bowel movement, women should cleanse from front to back. Use each tissue only once. °SEEK MEDICAL CARE IF:  °· You have back pain. °· You develop a fever. °· Your symptoms do not begin to resolve within 3 days. °SEEK IMMEDIATE MEDICAL CARE IF:  °· You have severe back pain or lower abdominal pain. °· You develop chills. °· You have nausea or vomiting. °· You have continued burning or discomfort with urination. °MAKE SURE YOU:  °· Understand these instructions. °· Will watch your condition. °· Will get help right away if you are not doing well or get worse. °Document Released: 02/22/2005 Document Revised: 11/14/2011 Document Reviewed: 06/23/2011 °[employer]® Patient Information ©2015 [employer]. This information is not intended to replace advice given to you by your health care provider. Make sure you discuss any questions you have with your health care provider. ° °

## 2014-08-11 NOTE — ED Notes (Signed)
Pt states has had a mid/lower abdominal pain x 4 weeks, past 3 days has had a fever, then this morning started having vomiting, vomited a couple of times, has been able to keep down water, denies diarrhea, states feels very weak, states was so weak was hard to walk to car to get here.

## 2014-08-11 NOTE — ED Notes (Signed)
Pt removed from bedpan as she has not voided yet.

## 2014-08-11 NOTE — ED Provider Notes (Signed)
CSN: [id number]     Arrival date & time 08/11/14  1848 History   First MD Initiated Contact with Patient 08/11/14 1943     Chief Complaint  Patient presents with  . Abdominal Pain  . Weakness     (Consider location/radiation/quality/duration/timing/severity/associated sxs/prior Treatment) HPI Comments: Patient presents to the emergency department with chief complaint of abdominal pain 2 weeks. 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 Patient states that she has moderate to severe pain. She states it feels like her abdomen is tied up in knots. She denies any measured fevers or chills. She reports feeling nauseated, but denies any vomiting. She denies any diarrhea or constipation. States that her last movement was yesterday and was normal for her. She has not tried taking anything to alleviate her symptoms. She also reports decreased appetite.  The history is provided by the patient. No language interpreter was used.    Past Medical History  Diagnosis Date  . Cancer    Past Surgical History  Procedure Laterality Date  . Abdominal hysterectomy    . Cholecystectomy     No family history on file. History  Substance Use Topics  . Smoking status: Current Every Day Smoker -- 1.00 packs/day    Types: Cigarettes  . Smokeless tobacco: Not on file  . Alcohol Use: No   OB History    No data available     Review of Systems  Constitutional: Negative for fever and chills.  Respiratory: Negative for shortness of breath.   Cardiovascular: Negative for chest pain.  Gastrointestinal: Positive for abdominal pain. Negative for nausea, vomiting, diarrhea and constipation.  Genitourinary: Negative for dysuria.  All other systems reviewed and are negative.     Allergies  Aspirin; Codeine-promethazine; Estrogens; and Phenobarbital  Home Medications   Prior to Admission medications    Medication Sig Start Date End Date Taking? Authorizing Provider  acetaminophen (TYLENOL) 500 MG tablet Take 500 mg by mouth every 6 (six) hours as needed for moderate pain or headache.    Yes Historical Provider, MD  calcium carbonate (OS-CAL) 600 MG TABS Take 600 mg by mouth daily.   Yes Historical Provider, MD  Cholecalciferol (VITAMIN D-3 PO) Take 1 tablet by mouth daily.   Yes Historical Provider, MD  Cyanocobalamin (VITAMIN B 12 PO) Take 2,500 mcg by mouth.    Yes Historical Provider, MD  ferrous sulfate 325 (65 FE) MG tablet Take 325 mg by mouth 2 (two) times daily.   Yes Historical Provider, MD  lansoprazole (PREVACID) 30 MG capsule Take 30 mg by mouth daily.   Yes Historical Provider, MD  LORazepam (ATIVAN) 1 MG tablet Take 1 mg by mouth every 8 (eight) hours as needed for anxiety.    Yes Historical Provider, MD  sertraline (ZOLOFT) 100 MG tablet Take 100 mg by mouth daily.   Yes Historical Provider, MD  traMADol (ULTRAM) 50 MG tablet Take 1 tablet (50 mg total) by mouth every 6 (six) hours as needed. Patient not taking: Reported on 08/11/2014 11/14/13   Fredia Sorrow, MD   BP 111/51 mmHg  Pulse 91  Temp(Src) 98.6 F (37 C) (Oral)  Resp 16  SpO2 97% Physical Exam  Constitutional: She is oriented to person, place, and time. She appears well-developed and well-nourished.  Chronically ill-appearing  HENT:  Head: Normocephalic and atraumatic.  Eyes: Conjunctivae and EOM are normal. Pupils are equal, round, and  reactive to light.  Neck: Normal range of motion. Neck supple.  Cardiovascular: Normal rate and regular rhythm.  Exam reveals no gallop and no friction rub.   No murmur heard. Pulmonary/Chest: Effort normal and breath sounds normal. No respiratory distress. She has no wheezes. She has no rales. She exhibits no tenderness.  Abdominal: Soft. Bowel sounds are normal. She exhibits no distension and no mass. There is tenderness. There is no rebound and no guarding.  Tenderness to  palpation in the periumbilical region, no other focal abdominal tenderness  Musculoskeletal: Normal range of motion. She exhibits no edema or tenderness.  Neurological: She is alert and oriented to person, place, and time.  Skin: Skin is warm and dry.  Psychiatric: She has a normal mood and affect. Her behavior is normal. Judgment and thought content normal.  Nursing note and vitals reviewed.   ED Course  Procedures (including critical care time) Results for orders placed or performed during the hospital encounter of 08/11/14  CBC with Differential  Result Value Ref Range   WBC 9.9 4.0 - 10.5 K/uL   RBC 3.84 (L) 3.87 - 5.11 MIL/uL   Hemoglobin 11.0 (L) 12.0 - 15.0 g/dL   HCT 34.1 (L) 36.0 - 46.0 %   MCV 88.8 78.0 - 100.0 fL   MCH 28.6 26.0 - 34.0 pg   MCHC 32.3 30.0 - 36.0 g/dL   RDW 13.2 11.5 - 15.5 %   Platelets 163 150 - 400 K/uL   Neutrophils Relative % 85 (H) 43 - 77 %   Neutro Abs 8.4 (H) 1.7 - 7.7 K/uL   Lymphocytes Relative 8 (L) 12 - 46 %   Lymphs Abs 0.8 0.7 - 4.0 K/uL   Monocytes Relative 7 3 - 12 %   Monocytes Absolute 0.7 0.1 - 1.0 K/uL   Eosinophils Relative 0 0 - 5 %   Eosinophils Absolute 0.0 0.0 - 0.7 K/uL   Basophils Relative 0 0 - 1 %   Basophils Absolute 0.0 0.0 - 0.1 K/uL  Comprehensive metabolic panel  Result Value Ref Range   Sodium 131 (L) 135 - 145 mmol/L   Potassium 3.6 3.5 - 5.1 mmol/L   Chloride 99 96 - 112 mmol/L   CO2 24 19 - 32 mmol/L   Glucose, Bld 105 (H) 70 - 99 mg/dL   BUN 15 6 - 23 mg/dL   Creatinine, Ser 0.79 0.50 - 1.10 mg/dL   Calcium 8.0 (L) 8.4 - 10.5 mg/dL   Total Protein 7.0 6.0 - 8.3 g/dL   Albumin 3.1 (L) 3.5 - 5.2 g/dL   AST 21 0 - 37 U/L   ALT 14 0 - 35 U/L   Alkaline Phosphatase 90 39 - 117 U/L   Total Bilirubin 0.4 0.3 - 1.2 mg/dL   GFR calc non Af Amer 78 (L) >90 mL/min   GFR calc Af Amer >90 >90 mL/min   Anion gap 8 5 - 15  Lipase, blood  Result Value Ref Range   Lipase 10 (L) 11 - 59 U/L  Urinalysis, Routine w  reflex microscopic  Result Value Ref Range   Color, Urine YELLOW YELLOW   APPearance CLEAR CLEAR   Specific Gravity, Urine 1.039 (H) 1.005 - 1.030   pH 5.5 5.0 - 8.0   Glucose, UA NEGATIVE NEGATIVE mg/dL   Hgb urine dipstick MODERATE (A) NEGATIVE   Bilirubin Urine NEGATIVE NEGATIVE   Ketones, ur 40 (A) NEGATIVE mg/dL   Protein, ur NEGATIVE NEGATIVE mg/dL  Urobilinogen, UA 1.0 0.0 - 1.0 mg/dL   Nitrite NEGATIVE NEGATIVE   Leukocytes, UA SMALL (A) NEGATIVE  Urine microscopic-add on  Result Value Ref Range   Squamous Epithelial / LPF RARE RARE   WBC, UA 7-10 <3 WBC/hpf   RBC / HPF 0-2 <3 RBC/hpf   Bacteria, UA RARE RARE   Ct Abdomen Pelvis W Contrast  08/11/2014   CLINICAL DATA:  Complains of abdominal pain for 2 weeks. Pain is getting progressively worse. History of ampullary of Vater adenocarcinoma.  EXAM: CT ABDOMEN AND PELVIS WITH CONTRAST  TECHNIQUE: Multidetector CT imaging of the abdomen and pelvis was performed using the standard protocol following bolus administration of intravenous contrast.  CONTRAST:  155mL OMNIPAQUE IOHEXOL 300 MG/ML  SOLN  COMPARISON:  10/17/2011  FINDINGS: Chronic parenchymal densities at both lung bases suggest old inflammatory changes. Findings could represent prior aspiration based on the large hiatal hernia. No evidence for pleural effusions. Negative for free air.  Again noted is a large hiatal hernia. Again noted are postsurgical changes in the distal stomach with a gastrojejunostomy. Pancreatic head is not visualized and findings are suggestive for a Whipple-type procedure.  Gallbladder has been removed. Again noted is pneumobilia. Portal venous system is patent. There is new focal biliary duct dilatation in the right anterior hepatic region. Portal venous system is patent.  Normal appearance of the spleen. Adrenal tissue is not well visualized but appears to be grossly stable. Normal appearance the left kidney. Again noted is a large cyst or cysts involving  the right kidney upper pole. This cyst or cysts measures up to 6.7 cm. Negative for hydronephrosis.  Atherosclerotic calcifications in the abdominal aorta without aneurysm. No evidence for free fluid or lymphadenopathy. Uterus has been removed. Fluid in the urinary bladder. No acute abnormalities involving the small or large bowel. No evidence for bowel obstruction.  Again noted are chronic compression fractures involving L3, L2 and T12. There is bone cement involving the T12 and L1 vertebral bodies. Again noted is kyphosis at T12.  IMPRESSION: Postsurgical changes compatible with a Whipple procedure. There is no evidence for a bowel obstruction.  New focal intrahepatic biliary dilatation near the anterior right hepatic lobe. Etiology for this finding is uncertain. Otherwise, no significant intrahepatic biliary dilatation.  Again noted is a large hiatal hernia. There are chronic parenchymal densities at the lung bases compatible with old inflammation. This is probably related to prior aspiration. No acute abnormalities in the lung bases.   Electronically Signed   By: Markus Daft M.D.   On: 08/11/2014 22:37      EKG Interpretation None      MDM   Final diagnoses:  Abdominal pain  UTI (lower urinary tract infection)    Prior cancer and multiple abdominal surgeries now complaining of abdominal pain. Will check labs, treat pain, and will check CT of the abdomen.  CT scan is reassuring, there is a new focal finding of intrahepatic biliary dilatation.  No emergent workup for this.  No evidence of infection, or SBO. Will recommend PCP follow-up.  Labs and workup reviewed with Dr. Tomi Bamberger.  Patient seen by and discussed with Dr. Tomi Bamberger.  No evidence of emergent process.  Labs are reassuring.  Patient feels improved.  UA remarkable for possible UTI.  Will treat with keflex and some pain medicine.  Recommend close follow-up with PCP.  She understands and agrees with the plan.  Patient is stable and ready for  discharge.   Montine Circle, PA-C  08/11/14 [place], MD 08/12/14 [phone]

## 2014-08-11 NOTE — ED Notes (Signed)
Patient vomiting. Notified Montine Circle, [place]. New orders obtained.

## 2014-09-08 DIAGNOSIS — M810 Age-related osteoporosis without current pathological fracture: Secondary | ICD-10-CM | POA: Diagnosis not present

## 2014-09-08 DIAGNOSIS — J449 Chronic obstructive pulmonary disease, unspecified: Secondary | ICD-10-CM | POA: Diagnosis not present

## 2014-09-08 DIAGNOSIS — F324 Major depressive disorder, single episode, in partial remission: Secondary | ICD-10-CM | POA: Diagnosis not present

## 2014-09-08 DIAGNOSIS — Z720 Tobacco use: Secondary | ICD-10-CM | POA: Diagnosis not present

## 2014-09-16 ENCOUNTER — Encounter (HOSPITAL_COMMUNITY): Payer: Federal, State, Local not specified - PPO

## 2014-09-25 ENCOUNTER — Inpatient Hospital Stay (HOSPITAL_COMMUNITY): Admission: RE | Admit: 2014-09-25 | Payer: Federal, State, Local not specified - PPO | Source: Ambulatory Visit

## 2014-09-30 ENCOUNTER — Encounter (HOSPITAL_COMMUNITY): Payer: Federal, State, Local not specified - PPO

## 2014-09-30 ENCOUNTER — Other Ambulatory Visit (HOSPITAL_COMMUNITY): Payer: Self-pay | Admitting: *Deleted

## 2014-10-01 ENCOUNTER — Encounter (HOSPITAL_COMMUNITY)
Admission: RE | Admit: 2014-10-01 | Discharge: 2014-10-01 | Disposition: A | Discharge status: Home / Self Care | Payer: Medicare Other | Source: Ambulatory Visit | Attending: Internal Medicine | Admitting: Internal Medicine

## 2014-10-01 DIAGNOSIS — M810 Age-related osteoporosis without current pathological fracture: Secondary | ICD-10-CM | POA: Diagnosis not present

## 2014-10-01 MED ORDER — ZOLEDRONIC ACID 5 MG/100ML IV SOLN
INTRAVENOUS | Status: AC
  Filled 2014-10-01: qty 100

## 2014-10-01 MED ORDER — ZOLEDRONIC ACID 5 MG/100ML IV SOLN
5.0000 mg | Freq: Once | INTRAVENOUS | Status: AC
  Administered 2014-10-01: 5 mg via INTRAVENOUS

## 2015-03-16 DIAGNOSIS — Z1389 Encounter for screening for other disorder: Secondary | ICD-10-CM | POA: Diagnosis not present

## 2015-03-16 DIAGNOSIS — Z23 Encounter for immunization: Secondary | ICD-10-CM | POA: Diagnosis not present

## 2015-03-16 DIAGNOSIS — R109 Unspecified abdominal pain: Secondary | ICD-10-CM | POA: Diagnosis not present

## 2015-03-16 DIAGNOSIS — F324 Major depressive disorder, single episode, in partial remission: Secondary | ICD-10-CM | POA: Diagnosis not present

## 2015-03-16 DIAGNOSIS — J449 Chronic obstructive pulmonary disease, unspecified: Secondary | ICD-10-CM | POA: Diagnosis not present

## 2015-03-16 DIAGNOSIS — M810 Age-related osteoporosis without current pathological fracture: Secondary | ICD-10-CM | POA: Diagnosis not present

## 2015-05-04 DIAGNOSIS — M810 Age-related osteoporosis without current pathological fracture: Secondary | ICD-10-CM | POA: Diagnosis not present

## 2015-06-15 DIAGNOSIS — R636 Underweight: Secondary | ICD-10-CM | POA: Diagnosis not present

## 2015-06-15 DIAGNOSIS — R1013 Epigastric pain: Secondary | ICD-10-CM | POA: Diagnosis not present

## 2015-06-15 DIAGNOSIS — L989 Disorder of the skin and subcutaneous tissue, unspecified: Secondary | ICD-10-CM | POA: Diagnosis not present

## 2015-06-21 DIAGNOSIS — C44622 Squamous cell carcinoma of skin of right upper limb, including shoulder: Secondary | ICD-10-CM | POA: Diagnosis not present

## 2015-07-06 DIAGNOSIS — Z85828 Personal history of other malignant neoplasm of skin: Secondary | ICD-10-CM | POA: Diagnosis not present

## 2015-07-06 DIAGNOSIS — C44622 Squamous cell carcinoma of skin of right upper limb, including shoulder: Secondary | ICD-10-CM | POA: Diagnosis not present

## 2015-09-30 DIAGNOSIS — F324 Major depressive disorder, single episode, in partial remission: Secondary | ICD-10-CM | POA: Diagnosis not present

## 2015-09-30 DIAGNOSIS — M810 Age-related osteoporosis without current pathological fracture: Secondary | ICD-10-CM | POA: Diagnosis not present

## 2015-09-30 DIAGNOSIS — J449 Chronic obstructive pulmonary disease, unspecified: Secondary | ICD-10-CM | POA: Diagnosis not present

## 2015-10-21 DIAGNOSIS — M810 Age-related osteoporosis without current pathological fracture: Secondary | ICD-10-CM | POA: Diagnosis not present

## 2015-10-29 ENCOUNTER — Other Ambulatory Visit (HOSPITAL_COMMUNITY): Payer: Self-pay

## 2015-11-01 ENCOUNTER — Encounter (HOSPITAL_COMMUNITY): Payer: Federal, State, Local not specified - PPO

## 2015-11-09 ENCOUNTER — Ambulatory Visit (HOSPITAL_COMMUNITY): Payer: Federal, State, Local not specified - PPO

## 2015-11-16 ENCOUNTER — Ambulatory Visit (HOSPITAL_COMMUNITY): Payer: Federal, State, Local not specified - PPO

## 2015-11-19 ENCOUNTER — Ambulatory Visit (HOSPITAL_COMMUNITY)
Admission: RE | Admit: 2015-11-19 | Discharge: 2015-11-19 | Disposition: A | Discharge status: Home / Self Care | Payer: Medicare Other | Source: Ambulatory Visit | Attending: Internal Medicine | Admitting: Internal Medicine

## 2015-11-19 DIAGNOSIS — M810 Age-related osteoporosis without current pathological fracture: Secondary | ICD-10-CM | POA: Insufficient documentation

## 2015-11-19 MED ORDER — ZOLEDRONIC ACID 5 MG/100ML IV SOLN
INTRAVENOUS | Status: AC
  Filled 2015-11-19: qty 100

## 2015-11-19 MED ORDER — ZOLEDRONIC ACID 5 MG/100ML IV SOLN
5.0000 mg | Freq: Once | INTRAVENOUS | Status: AC
  Administered 2015-11-19: 5 mg via INTRAVENOUS

## 2016-03-16 DIAGNOSIS — Z23 Encounter for immunization: Secondary | ICD-10-CM | POA: Diagnosis not present

## 2016-03-16 DIAGNOSIS — R636 Underweight: Secondary | ICD-10-CM | POA: Diagnosis not present

## 2016-03-16 DIAGNOSIS — K219 Gastro-esophageal reflux disease without esophagitis: Secondary | ICD-10-CM | POA: Diagnosis not present

## 2016-03-16 DIAGNOSIS — F324 Major depressive disorder, single episode, in partial remission: Secondary | ICD-10-CM | POA: Diagnosis not present

## 2016-03-16 DIAGNOSIS — Z7189 Other specified counseling: Secondary | ICD-10-CM | POA: Diagnosis not present

## 2016-03-16 DIAGNOSIS — Z720 Tobacco use: Secondary | ICD-10-CM | POA: Diagnosis not present

## 2016-03-16 DIAGNOSIS — M810 Age-related osteoporosis without current pathological fracture: Secondary | ICD-10-CM | POA: Diagnosis not present

## 2016-03-16 DIAGNOSIS — Z0000 Encounter for general adult medical examination without abnormal findings: Secondary | ICD-10-CM | POA: Diagnosis not present

## 2016-03-16 DIAGNOSIS — Z1389 Encounter for screening for other disorder: Secondary | ICD-10-CM | POA: Diagnosis not present

## 2016-03-16 DIAGNOSIS — J449 Chronic obstructive pulmonary disease, unspecified: Secondary | ICD-10-CM | POA: Diagnosis not present

## 2016-06-13 ENCOUNTER — Other Ambulatory Visit: Payer: Self-pay | Admitting: Internal Medicine

## 2016-06-13 DIAGNOSIS — R109 Unspecified abdominal pain: Secondary | ICD-10-CM

## 2016-06-13 DIAGNOSIS — R1084 Generalized abdominal pain: Secondary | ICD-10-CM | POA: Diagnosis not present

## 2016-06-19 ENCOUNTER — Ambulatory Visit
Admission: RE | Admit: 2016-06-19 | Discharge: 2016-06-19 | Disposition: A | Discharge status: Home / Self Care | Payer: Medicare Other | Source: Ambulatory Visit | Attending: Internal Medicine | Admitting: Internal Medicine

## 2016-06-19 DIAGNOSIS — R1030 Lower abdominal pain, unspecified: Secondary | ICD-10-CM | POA: Diagnosis not present

## 2016-06-19 DIAGNOSIS — R109 Unspecified abdominal pain: Secondary | ICD-10-CM

## 2016-06-19 MED ORDER — IOPAMIDOL (ISOVUE-300) INJECTION 61%
100.0000 mL | Freq: Once | INTRAVENOUS | Status: AC | PRN
  Administered 2016-06-19: 100 mL via INTRAVENOUS

## 2016-07-13 DIAGNOSIS — R634 Abnormal weight loss: Secondary | ICD-10-CM | POA: Diagnosis not present

## 2016-07-13 DIAGNOSIS — R10816 Epigastric abdominal tenderness: Secondary | ICD-10-CM | POA: Diagnosis not present

## 2016-07-21 ENCOUNTER — Other Ambulatory Visit: Payer: Self-pay | Admitting: Gastroenterology

## 2016-07-26 ENCOUNTER — Ambulatory Visit (HOSPITAL_COMMUNITY): Admission: RE | Admit: 2016-07-26 | Payer: Medicare Other | Source: Ambulatory Visit | Admitting: Gastroenterology

## 2016-07-26 ENCOUNTER — Encounter (HOSPITAL_COMMUNITY): Admission: RE | Payer: Self-pay | Source: Ambulatory Visit

## 2016-07-26 SURGERY — ESOPHAGOGASTRODUODENOSCOPY (EGD) WITH PROPOFOL
Anesthesia: Monitor Anesthesia Care

## 2016-07-28 ENCOUNTER — Emergency Department (HOSPITAL_COMMUNITY)
Admission: EM | Admit: 2016-07-28 | Discharge: 2016-07-28 | Disposition: A | Discharge status: Home / Self Care | Payer: Medicare Other | Attending: Emergency Medicine | Admitting: Emergency Medicine

## 2016-07-28 ENCOUNTER — Encounter (HOSPITAL_COMMUNITY): Payer: Self-pay | Admitting: Emergency Medicine

## 2016-07-28 ENCOUNTER — Emergency Department (HOSPITAL_COMMUNITY): Payer: Medicare Other

## 2016-07-28 DIAGNOSIS — Z85068 Personal history of other malignant neoplasm of small intestine: Secondary | ICD-10-CM | POA: Insufficient documentation

## 2016-07-28 DIAGNOSIS — F17210 Nicotine dependence, cigarettes, uncomplicated: Secondary | ICD-10-CM | POA: Diagnosis not present

## 2016-07-28 DIAGNOSIS — J988 Other specified respiratory disorders: Secondary | ICD-10-CM

## 2016-07-28 DIAGNOSIS — I959 Hypotension, unspecified: Secondary | ICD-10-CM | POA: Diagnosis not present

## 2016-07-28 DIAGNOSIS — J449 Chronic obstructive pulmonary disease, unspecified: Secondary | ICD-10-CM | POA: Diagnosis not present

## 2016-07-28 DIAGNOSIS — R05 Cough: Secondary | ICD-10-CM | POA: Diagnosis not present

## 2016-07-28 DIAGNOSIS — J069 Acute upper respiratory infection, unspecified: Secondary | ICD-10-CM | POA: Diagnosis not present

## 2016-07-28 LAB — BASIC METABOLIC PANEL
Anion gap: 12 (ref 5–15)
BUN: 8 mg/dL (ref 6–20)
CALCIUM: 9.2 mg/dL (ref 8.9–10.3)
CO2: 27 mmol/L (ref 22–32)
Chloride: 95 mmol/L — ABNORMAL LOW (ref 101–111)
Creatinine, Ser: 0.89 mg/dL (ref 0.44–1.00)
GFR calc Af Amer: >60 mL/min (ref >60)
GFR, EST NON AFRICAN AMERICAN: 60 mL/min — AB (ref >60)
Glucose, Bld: 126 mg/dL — ABNORMAL HIGH (ref 65–99)
Potassium: 4.2 mmol/L (ref 3.5–5.1)
Sodium: 134 mmol/L — ABNORMAL LOW (ref 135–145)

## 2016-07-28 LAB — CBC
HCT: 33 % — ABNORMAL LOW (ref 36.0–46.0)
Hemoglobin: 10.6 g/dL — ABNORMAL LOW (ref 12.0–15.0)
MCH: 28.8 pg (ref 26.0–34.0)
MCHC: 32.1 g/dL (ref 30.0–36.0)
MCV: 89.7 fL (ref 78.0–100.0)
Platelets: 295 10*3/uL (ref 150–400)
RBC: 3.68 MIL/uL — ABNORMAL LOW (ref 3.87–5.11)
RDW: 13.7 % (ref 11.5–15.5)
WBC: 12 10*3/uL — ABNORMAL HIGH (ref 4.0–10.5)

## 2016-07-28 LAB — I-STAT TROPONIN, ED: TROPONIN I, POC: 0.03 ng/mL (ref 0.00–0.08)

## 2016-07-28 LAB — I-STAT CG4 LACTIC ACID, ED: Lactic Acid, Venous: 1.88 mmol/L (ref 0.5–1.9)

## 2016-07-28 MED ORDER — LEVOFLOXACIN 250 MG PO TABS: 250.0000 mg | ORAL_TABLET | Freq: Every day | ORAL | 0 refills | Status: DC

## 2016-07-28 MED ORDER — ALBUTEROL SULFATE HFA 108 (90 BASE) MCG/ACT IN AERS: 1.0000 | INHALATION_SPRAY | Freq: Four times a day (QID) | RESPIRATORY_TRACT | 0 refills | Status: DC | PRN

## 2016-07-28 MED ORDER — PREDNISONE 10 MG PO TABS: 20.0000 mg | ORAL_TABLET | Freq: Every day | ORAL | 0 refills | Status: DC

## 2016-07-28 MED ORDER — IPRATROPIUM-ALBUTEROL 0.5-2.5 (3) MG/3ML IN SOLN
3.0000 mL | Freq: Once | RESPIRATORY_TRACT | Status: AC
  Administered 2016-07-28: 3 mL via RESPIRATORY_TRACT
  Filled 2016-07-28: qty 3

## 2016-07-28 MED ORDER — PREDNISONE 20 MG PO TABS
40.0000 mg | ORAL_TABLET | Freq: Once | ORAL | Status: AC
  Administered 2016-07-28: 40 mg via ORAL
  Filled 2016-07-28: qty 2

## 2016-07-28 MED ORDER — SODIUM CHLORIDE 0.9 % IV BOLUS (SEPSIS)
1000.0000 mL | Freq: Once | INTRAVENOUS | Status: AC
  Administered 2016-07-28: 1000 mL via INTRAVENOUS

## 2016-07-28 MED ORDER — LEVOFLOXACIN 500 MG PO TABS
500.0000 mg | ORAL_TABLET | ORAL | Status: AC
  Administered 2016-07-28: 500 mg via ORAL
  Filled 2016-07-28: qty 1

## 2016-07-28 NOTE — ED Provider Notes (Signed)
  Face-to-face evaluation   History: Sensitive for evaluation of illness for 3-4 weeks characterized by general weakness, malaise, decreased oral intake, and cough productive of sputum.  She denies fever, chills, chest pain or paresthesia.  She continues to smoke cigarettes.  She uses an inhaler at home occasionally.  Physical exam: Frail elderly female.  Lungs with decreased air movement bilaterally and generalized rhonchi.  No increased work of breathing.  She is lucid.  Medical screening examination/treatment/procedure(s) were conducted as a shared visit with non-physician practitioner(s) and myself.  I personally evaluated the patient during the encounter   Daleen Bo, MD 07/30/16 [phone]

## 2016-07-28 NOTE — Discharge Instructions (Signed)
Please use medication as directed.  Please contact her primary care and follow-up in the next 2-3 days for reassessment.  If you have any new or worsening signs or symptoms please return immediately to the emergency room.

## 2016-07-28 NOTE — ED Triage Notes (Signed)
Pt has had nasal congestion, fatigue, cough for a week. Also reports SOB and CP. Pt went to MD office today- flu swab was negative.

## 2016-07-28 NOTE — ED Provider Notes (Signed)
[facility] DEPT Provider Note   CSN: [id number] Arrival date & time: 07/28/16  1452   History   Chief Complaint Chief Complaint  Patient presents with  . Cough  . Shortness of Breath  . Chest Pain    HPI Mackenzie Mendoza is a 83 y.o. female.  HPI   83 year old female presents today with complaints of cough.  She notes symptoms started approximately a week ago.  She notes that both her and her husband developed similar symptoms with upper respiratory congestion, postnasal drainage and cough.  She notes the cough is productive, hurts her chest with coughing.  She denies any chest pain at baseline, no significant shortness of breath.  She denies any lower extremity swelling or edema.  She denies any fever at home.  Patient notes she was seen by her primary care provider today with a negative flu test.  She was instructed to come to the emergency room for evaluation for pneumonia.  She reports she is a smoker.  Past Medical History:  Diagnosis Date  . Cancer [facility])     Patient Active Problem List   Diagnosis Date Noted  . Weight loss 03/05/2012  . Cancer of ampulla of Vater ([place]) 09/05/2011  . Osteoporosis 09/05/2011  . History of anemia 09/05/2011  . Other B-complex deficiencies 09/05/2011    Past Surgical History:  Procedure Laterality Date  . ABDOMINAL HYSTERECTOMY    . CHOLECYSTECTOMY      OB History    No data available       Home Medications    Prior to Admission medications   Medication Sig Start Date End Date Taking? Authorizing Provider  acetaminophen (TYLENOL) 500 MG tablet Take 1,000 mg by mouth every 6 (six) hours as needed for moderate pain or headache.    Yes Historical Provider, MD  cholecalciferol (VITAMIN D) 1000 units tablet Take 2,000 Units by mouth daily as needed (takes occasionally).    Yes Historical Provider, MD  Cyanocobalamin (VITAMIN B 12 PO) Take 2,500 mcg by mouth once a week.    Yes Historical Provider, MD  esomeprazole (NEXIUM) 20  MG capsule Take 20 mg by mouth daily as needed (acid reflux).   Yes Historical Provider, MD  LORazepam (ATIVAN) 1 MG tablet Take 1 mg by mouth See admin instructions. Takes 1 tab daily, and can take 1 addt'l tab as needed for anxiety   Yes Historical Provider, MD  albuterol (PROVENTIL HFA;VENTOLIN HFA) 108 (90 Base) MCG/ACT inhaler Inhale 1-2 puffs into the lungs every 6 (six) hours as needed for wheezing or shortness of breath. 07/28/16   Okey Regal, PA-C  levofloxacin (LEVAQUIN) 250 MG tablet Take 1 tablet (250 mg total) by mouth daily. 07/28/16   Okey Regal, PA-C  predniSONE (DELTASONE) 10 MG tablet Take 2 tablets (20 mg total) by mouth daily. 07/28/16   Okey Regal, PA-C    Family History History reviewed. No pertinent family history.  Social History Social History  Substance Use Topics  . Smoking status: Current Every Day Smoker    Packs/day: 1.00    Types: Cigarettes  . Smokeless tobacco: Never Used  . Alcohol use No     Allergies   Aspirin; Codeine-promethazine [promethazine-codeine]; Estrogens; and Phenobarbital   Review of Systems Review of Systems  All other systems reviewed and are negative.    Physical Exam Updated Vital Signs BP 90/71   Pulse 86   Temp 99 F (37.2 C) (Oral)   Resp 22   SpO2 100%  Physical Exam  Constitutional: She is oriented to person, place, and time. She appears well-developed and well-nourished.  HENT:  Head: Normocephalic and atraumatic.  Eyes: Conjunctivae are normal. Pupils are equal, round, and reactive to light. Right eye exhibits no discharge. Left eye exhibits no discharge. No scleral icterus.  Neck: Normal range of motion. No JVD present. No tracheal deviation present.  Cardiovascular: Normal rate, regular rhythm, normal heart sounds and intact distal pulses.   Pulmonary/Chest: Effort normal. No stridor.  Bilateral crackles and small amount of expiratory wheeze  Musculoskeletal: She exhibits no edema.  Neurological: She  is alert and oriented to person, place, and time. Coordination normal.  Skin: Skin is warm.  Psychiatric: She has a normal mood and affect. Her behavior is normal. Judgment and thought content normal.  Nursing note and vitals reviewed.    ED Treatments / Results  Labs (all labs ordered are listed, but only abnormal results are displayed) Labs Reviewed  BASIC METABOLIC PANEL - Abnormal; Notable for the following:       Result Value   Sodium 134 (*)    Chloride 95 (*)    Glucose, Bld 126 (*)    GFR calc non Af Amer 60 (*)    All other components within normal limits  CBC - Abnormal; Notable for the following:    WBC 12.0 (*)    RBC 3.68 (*)    Hemoglobin 10.6 (*)    HCT 33.0 (*)    All other components within normal limits  I-STAT TROPOININ, ED  I-STAT CG4 LACTIC ACID, ED    EKG  EKG Interpretation  Date/Time:  Friday July 28 2016 15:02:28 EST Ventricular Rate:  95 PR Interval:  144 QRS Duration: 74 QT Interval:  344 QTC Calculation: 432 R Axis:   64 Text Interpretation:  Normal sinus rhythm Normal ECG since last tracing no significant change Confirmed by Eulis Foster  MD, ELLIOTT [phone]) on 07/28/2016 8:11:55 PM       Radiology Dg Chest 2 View  Result Date: 07/28/2016 CLINICAL DATA:  83 y/o  F; fatigue and productive cough. EXAM: CHEST  2 VIEW COMPARISON:  10/18/2012 chest radiograph. FINDINGS: Stable normal cardiac silhouette. Large hiatal hernia. Aortic atherosclerosis with calcification. Hyperinflated lungs with flattened diaphragms compatible with COPD. Coarse pulmonary markings likely representing chronic bronchitic changes. No focal consolidation. No pleural effusion or pneumothorax. Stable severe T12 as well as mild L2 and L3 compression deformities and kyphoplasty changes at T12 and L2. Bones are demineralized. No acute osseous abnormality is identified. IMPRESSION: 1. Large hiatal hernia. 2. Coarse pulmonary markings likely representing chronic bronchitic changes. No  focal consolidation. COPD. 3. Stable lower thoracic and lumbar compression deformities and kyphoplasty changes. 4. Aortic atherosclerosis. Electronically Signed   By: Kristine Garbe M.D.   On: 07/28/2016 15:27    Procedures Procedures (including critical care time)  Medications Ordered in ED Medications  predniSONE (DELTASONE) tablet 40 mg (not administered)  levofloxacin (LEVAQUIN) tablet 500 mg (not administered)  ipratropium-albuterol (DUONEB) 0.5-2.5 (3) MG/3ML nebulizer solution 3 mL (3 mLs Nebulization Given 07/28/16 1826)  sodium chloride 0.9 % bolus 1,000 mL (1,000 mLs Intravenous New Bag/Given 07/28/16 2016)  ipratropium-albuterol (DUONEB) 0.5-2.5 (3) MG/3ML nebulizer solution 3 mL (3 mLs Nebulization Given 07/28/16 2015)     Initial Impression / Assessment and Plan / ED Course  I have reviewed the triage vital signs and the nursing notes.  Pertinent labs & imaging results that were available during my care of the patient were  reviewed by me and considered in my medical decision making (see chart for details).      Final Clinical Impressions(s) / ED Diagnoses   Final diagnoses:  Respiratory infection    Labs: I-STAT lactic acid, i-STAT troponin, BMP, CBC  Imaging: DG Chest 2 View  Consults:  Therapeutics: Albuterol, ipratropium, prednisone, Levaquin  Discharge Meds: Albuterol, prednisone, Levaquin  Assessment/Plan: 83 year old female presents today with likely upper respiratory infection.  Patient has Ackles on exam, negative chest x-ray.  She is afebrile nontoxic here.  Patient has slight elevation in WBC.  Based on age and clinical findings patient will be treated with Levaquin for bacterial coverage.  Patient is a longtime smoker, continues to smoke with a history of COPD.  Negative influenza have primary care.  Patient was given breathing treatment here which improved her breathing, I personally ambulated her in the hallway with oxygen saturations in the mid  to upper 90s.  Spoke with pharmacist as far as medical management with Levaquin she instructed to give 500 once here followed by 4 days of 250.  Patient will be discharged home with close follow-up with primary care, strict return precautions given.  Patient had no further questions or concerns at time of discharge.     New Prescriptions New Prescriptions   ALBUTEROL (PROVENTIL HFA;VENTOLIN HFA) 108 (90 BASE) MCG/ACT INHALER    Inhale 1-2 puffs into the lungs every 6 (six) hours as needed for wheezing or shortness of breath.   LEVOFLOXACIN (LEVAQUIN) 250 MG TABLET    Take 1 tablet (250 mg total) by mouth daily.   PREDNISONE (DELTASONE) 10 MG TABLET    Take 2 tablets (20 mg total) by mouth daily.     Okey Regal, PA-C 07/28/16 2105    Daleen Bo, MD 07/30/16 [phone]

## 2016-08-09 ENCOUNTER — Inpatient Hospital Stay (HOSPITAL_COMMUNITY)
Admission: EM | Admit: 2016-08-09 | Discharge: 2016-08-14 | DRG: 177 | Disposition: A | Discharge status: Home / Self Care | Payer: Medicare Other | Attending: Internal Medicine | Admitting: Internal Medicine

## 2016-08-09 ENCOUNTER — Encounter (HOSPITAL_COMMUNITY): Payer: Self-pay

## 2016-08-09 ENCOUNTER — Emergency Department (HOSPITAL_COMMUNITY): Payer: Medicare Other

## 2016-08-09 DIAGNOSIS — F329 Major depressive disorder, single episode, unspecified: Secondary | ICD-10-CM | POA: Diagnosis not present

## 2016-08-09 DIAGNOSIS — R1084 Generalized abdominal pain: Secondary | ICD-10-CM

## 2016-08-09 DIAGNOSIS — G8929 Other chronic pain: Secondary | ICD-10-CM | POA: Diagnosis present

## 2016-08-09 DIAGNOSIS — E871 Hypo-osmolality and hyponatremia: Secondary | ICD-10-CM | POA: Diagnosis present

## 2016-08-09 DIAGNOSIS — D649 Anemia, unspecified: Secondary | ICD-10-CM | POA: Diagnosis present

## 2016-08-09 DIAGNOSIS — Z90411 Acquired partial absence of pancreas: Secondary | ICD-10-CM

## 2016-08-09 DIAGNOSIS — E878 Other disorders of electrolyte and fluid balance, not elsewhere classified: Secondary | ICD-10-CM | POA: Diagnosis present

## 2016-08-09 DIAGNOSIS — Z885 Allergy status to narcotic agent status: Secondary | ICD-10-CM

## 2016-08-09 DIAGNOSIS — Z8711 Personal history of peptic ulcer disease: Secondary | ICD-10-CM | POA: Diagnosis not present

## 2016-08-09 DIAGNOSIS — R1030 Lower abdominal pain, unspecified: Secondary | ICD-10-CM | POA: Diagnosis present

## 2016-08-09 DIAGNOSIS — J9601 Acute respiratory failure with hypoxia: Secondary | ICD-10-CM | POA: Diagnosis present

## 2016-08-09 DIAGNOSIS — J690 Pneumonitis due to inhalation of food and vomit: Principal | ICD-10-CM | POA: Diagnosis present

## 2016-08-09 DIAGNOSIS — Z886 Allergy status to analgesic agent status: Secondary | ICD-10-CM

## 2016-08-09 DIAGNOSIS — R109 Unspecified abdominal pain: Secondary | ICD-10-CM | POA: Diagnosis not present

## 2016-08-09 DIAGNOSIS — F419 Anxiety disorder, unspecified: Secondary | ICD-10-CM | POA: Diagnosis present

## 2016-08-09 DIAGNOSIS — E43 Unspecified severe protein-calorie malnutrition: Secondary | ICD-10-CM | POA: Diagnosis present

## 2016-08-09 DIAGNOSIS — F32A Depression, unspecified: Secondary | ICD-10-CM | POA: Diagnosis present

## 2016-08-09 DIAGNOSIS — K219 Gastro-esophageal reflux disease without esophagitis: Secondary | ICD-10-CM | POA: Diagnosis present

## 2016-08-09 DIAGNOSIS — Z888 Allergy status to other drugs, medicaments and biological substances status: Secondary | ICD-10-CM | POA: Diagnosis not present

## 2016-08-09 DIAGNOSIS — K5901 Slow transit constipation: Secondary | ICD-10-CM | POA: Diagnosis not present

## 2016-08-09 DIAGNOSIS — K449 Diaphragmatic hernia without obstruction or gangrene: Secondary | ICD-10-CM | POA: Diagnosis present

## 2016-08-09 DIAGNOSIS — E611 Iron deficiency: Secondary | ICD-10-CM | POA: Diagnosis present

## 2016-08-09 DIAGNOSIS — I959 Hypotension, unspecified: Secondary | ICD-10-CM | POA: Diagnosis present

## 2016-08-09 DIAGNOSIS — K2970 Gastritis, unspecified, without bleeding: Secondary | ICD-10-CM | POA: Diagnosis not present

## 2016-08-09 DIAGNOSIS — C241 Malignant neoplasm of ampulla of Vater: Secondary | ICD-10-CM | POA: Diagnosis present

## 2016-08-09 DIAGNOSIS — R10819 Abdominal tenderness, unspecified site: Secondary | ICD-10-CM | POA: Diagnosis not present

## 2016-08-09 DIAGNOSIS — F17210 Nicotine dependence, cigarettes, uncomplicated: Secondary | ICD-10-CM | POA: Diagnosis present

## 2016-08-09 DIAGNOSIS — Z79899 Other long term (current) drug therapy: Secondary | ICD-10-CM | POA: Diagnosis not present

## 2016-08-09 DIAGNOSIS — E876 Hypokalemia: Secondary | ICD-10-CM | POA: Diagnosis present

## 2016-08-09 DIAGNOSIS — R1010 Upper abdominal pain, unspecified: Secondary | ICD-10-CM | POA: Diagnosis not present

## 2016-08-09 DIAGNOSIS — K5900 Constipation, unspecified: Secondary | ICD-10-CM | POA: Diagnosis present

## 2016-08-09 LAB — I-STAT CHEM 8, ED
BUN: 10 mg/dL (ref 6–20)
CALCIUM ION: 1.1 mmol/L — AB (ref 1.15–1.40)
Chloride: 101 mmol/L (ref 101–111)
Creatinine, Ser: 0.8 mg/dL (ref 0.44–1.00)
Glucose, Bld: 192 mg/dL — ABNORMAL HIGH (ref 65–99)
HEMATOCRIT: 35 % — AB (ref 36.0–46.0)
Hemoglobin: 11.9 g/dL — ABNORMAL LOW (ref 12.0–15.0)
Potassium: 3.9 mmol/L (ref 3.5–5.1)
SODIUM: 135 mmol/L (ref 135–145)
TCO2: 26 mmol/L (ref 0–100)

## 2016-08-09 LAB — CBC WITH DIFFERENTIAL/PLATELET
BASOS PCT: 0 %
Basophils Absolute: 0 10*3/uL (ref 0.0–0.1)
EOS ABS: 0 10*3/uL (ref 0.0–0.7)
EOS PCT: 0 %
HCT: 35.3 % — ABNORMAL LOW (ref 36.0–46.0)
Hemoglobin: 11.6 g/dL — ABNORMAL LOW (ref 12.0–15.0)
LYMPHS ABS: 1.5 10*3/uL (ref 0.7–4.0)
Lymphocytes Relative: 9 %
MCH: 29.3 pg (ref 26.0–34.0)
MCHC: 32.9 g/dL (ref 30.0–36.0)
MCV: 89.1 fL (ref 78.0–100.0)
MONO ABS: 0.5 10*3/uL (ref 0.1–1.0)
Monocytes Relative: 3 %
NEUTROS PCT: 88 %
Neutro Abs: 15 10*3/uL — ABNORMAL HIGH (ref 1.7–7.7)
PLATELETS: UNDETERMINED 10*3/uL (ref 150–400)
RBC: 3.96 MIL/uL (ref 3.87–5.11)
RDW: 14.4 % (ref 11.5–15.5)
WBC: 17 10*3/uL — AB (ref 4.0–10.5)

## 2016-08-09 LAB — COMPREHENSIVE METABOLIC PANEL
ALBUMIN: 3.2 g/dL — AB (ref 3.5–5.0)
ALK PHOS: 80 U/L (ref 38–126)
ALT: 13 U/L — ABNORMAL LOW (ref 14–54)
ANION GAP: 9 (ref 5–15)
AST: 18 U/L (ref 15–41)
BILIRUBIN TOTAL: 0.5 mg/dL (ref 0.3–1.2)
BUN: 10 mg/dL (ref 6–20)
CALCIUM: 8.6 mg/dL — AB (ref 8.9–10.3)
CO2: 23 mmol/L (ref 22–32)
Chloride: 100 mmol/L — ABNORMAL LOW (ref 101–111)
Creatinine, Ser: 0.92 mg/dL (ref 0.44–1.00)
GFR calc Af Amer: >60 mL/min (ref >60)
GFR, EST NON AFRICAN AMERICAN: 57 mL/min — AB (ref >60)
Glucose, Bld: 188 mg/dL — ABNORMAL HIGH (ref 65–99)
POTASSIUM: 3.8 mmol/L (ref 3.5–5.1)
Sodium: 132 mmol/L — ABNORMAL LOW (ref 135–145)
TOTAL PROTEIN: 6.7 g/dL (ref 6.5–8.1)

## 2016-08-09 LAB — URINALYSIS, ROUTINE W REFLEX MICROSCOPIC
Bilirubin Urine: NEGATIVE
GLUCOSE, UA: NEGATIVE mg/dL
HGB URINE DIPSTICK: NEGATIVE
KETONES UR: NEGATIVE mg/dL
Leukocytes, UA: NEGATIVE
Nitrite: NEGATIVE
Protein, ur: NEGATIVE mg/dL
Specific Gravity, Urine: 1.018 (ref 1.005–1.030)
pH: 5 (ref 5.0–8.0)

## 2016-08-09 LAB — GLUCOSE, CAPILLARY
GLUCOSE-CAPILLARY: 179 mg/dL — AB (ref 65–99)
GLUCOSE-CAPILLARY: 79 mg/dL (ref 65–99)

## 2016-08-09 LAB — LIPASE, BLOOD: Lipase: 10 U/L — ABNORMAL LOW (ref 11–51)

## 2016-08-09 LAB — I-STAT CG4 LACTIC ACID, ED: LACTIC ACID, VENOUS: 1.5 mmol/L (ref 0.5–1.9)

## 2016-08-09 MED ORDER — SODIUM CHLORIDE 0.9 % IV SOLN
3.0000 g | Freq: Three times a day (TID) | INTRAVENOUS | Status: DC
  Administered 2016-08-09 – 2016-08-10 (×2): 3 g via INTRAVENOUS
  Filled 2016-08-09 (×3): qty 3

## 2016-08-09 MED ORDER — VITAMIN D3 25 MCG (1000 UNIT) PO TABS: 2000.0000 [IU] | ORAL_TABLET | Freq: Every day | ORAL | Status: DC

## 2016-08-09 MED ORDER — IPRATROPIUM-ALBUTEROL 0.5-2.5 (3) MG/3ML IN SOLN: 3.0000 mL | RESPIRATORY_TRACT | Status: DC | PRN

## 2016-08-09 MED ORDER — CITALOPRAM HYDROBROMIDE 10 MG PO TABS
10.0000 mg | ORAL_TABLET | Freq: Every day | ORAL | Status: DC
  Administered 2016-08-10 – 2016-08-14 (×5): 10 mg via ORAL
  Filled 2016-08-09 (×5): qty 1

## 2016-08-09 MED ORDER — SODIUM CHLORIDE 0.9 % IV SOLN
INTRAVENOUS | Status: AC
  Administered 2016-08-09 – 2016-08-10 (×2): via INTRAVENOUS

## 2016-08-09 MED ORDER — ONDANSETRON HCL 4 MG/2ML IJ SOLN
4.0000 mg | Freq: Once | INTRAMUSCULAR | Status: AC
  Administered 2016-08-09: 4 mg via INTRAVENOUS
  Filled 2016-08-09: qty 2

## 2016-08-09 MED ORDER — MORPHINE SULFATE (PF) 4 MG/ML IV SOLN
2.0000 mg | Freq: Once | INTRAVENOUS | Status: AC
  Administered 2016-08-09: 2 mg via INTRAVENOUS
  Filled 2016-08-09: qty 1

## 2016-08-09 MED ORDER — PANTOPRAZOLE SODIUM 40 MG PO TBEC: 40.0000 mg | DELAYED_RELEASE_TABLET | Freq: Every day | ORAL | Status: DC

## 2016-08-09 MED ORDER — INSULIN ASPART 100 UNIT/ML ~~LOC~~ SOLN
0.0000 [IU] | SUBCUTANEOUS | Status: DC
  Administered 2016-08-09: 21:00:00 2 [IU] via SUBCUTANEOUS
  Administered 2016-08-10 (×2): 1 [IU] via SUBCUTANEOUS
  Administered 2016-08-11 (×3): 2 [IU] via SUBCUTANEOUS
  Administered 2016-08-12 (×2): 1 [IU] via SUBCUTANEOUS
  Administered 2016-08-12: 3 [IU] via SUBCUTANEOUS
  Administered 2016-08-13: 05:00:00 1 [IU] via SUBCUTANEOUS
  Administered 2016-08-13 – 2016-08-14 (×3): 2 [IU] via SUBCUTANEOUS
  Administered 2016-08-14: 1 [IU] via SUBCUTANEOUS
  Administered 2016-08-14: 13:00:00 2 [IU] via SUBCUTANEOUS
  Filled 2016-08-09: qty 1

## 2016-08-09 MED ORDER — IOPAMIDOL (ISOVUE-300) INJECTION 61%
INTRAVENOUS | Status: AC
  Filled 2016-08-09: qty 100

## 2016-08-09 MED ORDER — IOPAMIDOL (ISOVUE-300) INJECTION 61%
INTRAVENOUS | Status: AC
  Administered 2016-08-09: 30 mL
  Filled 2016-08-09: qty 30

## 2016-08-09 MED ORDER — PIPERACILLIN-TAZOBACTAM 3.375 G IVPB 30 MIN
3.3750 g | Freq: Once | INTRAVENOUS | Status: AC
  Administered 2016-08-09: 3.375 g via INTRAVENOUS
  Filled 2016-08-09: qty 50

## 2016-08-09 MED ORDER — VANCOMYCIN HCL IN DEXTROSE 1-5 GM/200ML-% IV SOLN
1000.0000 mg | Freq: Once | INTRAVENOUS | Status: AC
  Administered 2016-08-09: 1000 mg via INTRAVENOUS
  Filled 2016-08-09: qty 200

## 2016-08-09 MED ORDER — SODIUM CHLORIDE 0.9 % IV BOLUS (SEPSIS)
1000.0000 mL | Freq: Once | INTRAVENOUS | Status: AC
  Administered 2016-08-09: 1000 mL via INTRAVENOUS

## 2016-08-09 MED ORDER — LORAZEPAM 1 MG PO TABS: 1.0000 mg | ORAL_TABLET | Freq: Two times a day (BID) | ORAL | Status: DC | PRN

## 2016-08-09 MED ORDER — IOPAMIDOL (ISOVUE-300) INJECTION 61%
100.0000 mL | Freq: Once | INTRAVENOUS | Status: AC | PRN
  Administered 2016-08-09: 80 mL via INTRAVENOUS

## 2016-08-09 NOTE — H&P (Signed)
History and Physical    Mackenzie Mendoza ZJQ:[id number] DOB: [birth year] DOA: 08/09/2016  PCP: Irven Shelling, MD   Patient coming from: Home  Chief Complaint: Abdominal pain, cough, weakness, SOB   HPI: Mackenzie Mendoza is a 83 y.o. 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 Patient is a very poor historian and history is augmented by the report of her son at the bedside. She has reportedly been suffering from vague generalized abdominal discomfort for several months with no alleviating or exacerbating factors identified. Pain is worsened significantly over the past 2 weeks, and particularly over the past couple days. She now describes the pain as severe, sharp, localized to the upper quadrants and epigastrium, with no identifiable alleviating or exacerbating factors, no nausea, and no vomiting or diarrhea. Patient reports frequent chills, but denies ears per se. She denies chest pain or palpitations. She endorses a nonproductive cough and exertional dyspnea, worsening insidiously over the past week or more. She denies headache, change in vision or hearing, loss of coordination, or focal numbness or weakness. She has not attempted any interventions for her symptoms prior to coming in.  ED Course: Upon arrival to the ED, patient is found to be afebrile, requiring 2 L of supplemental oxygen to maintain O2 saturations in the low 90s, and with vitals otherwise stable. Chemistry panels notable for mild hyponatremia and hypochloremia and CBC features a leukocytosis to 17,000, stable normocytic anemia with hemoglobin of 11.6, and clumped platelets. Lactic acid is reassuring at 1.50 and urinalysis is unremarkable. CT of the abdomen and  pelvis was obtained and is most notable for changes at the lung bases suggestive of aspiration pneumonia or atypical infection, but also notable for nonspecific free fluid in the upper abdomen and pelvis. Blood and urine cultures were obtained in the ED and the patient was started on empiric vancomycin and Zosyn. She remained hemodynamically stable and continued to oxygenate well with nasal cannula. She will be admitted to the medical/surgical unit for ongoing evaluation and management of acute hypoxia secondary to aspiration pneumonia, and acute on chronic abdominal pain with nonspecific CT abdomen findings.   Review of Systems:  All other systems reviewed and apart from HPI, are negative.  Past Medical History:  Diagnosis Date  . Cancer [facility])     Past Surgical History:  Procedure Laterality Date  . ABDOMINAL HYSTERECTOMY    . CHOLECYSTECTOMY       reports that she has been smoking Cigarettes.  She has been smoking about 1.00 pack per day. She has never used smokeless tobacco. She reports that she does not drink alcohol or use drugs.  Allergies  Allergen Reactions  . Aspirin Hives  . Codeine-Promethazine [Promethazine-Codeine] Nausea And Vomiting  . Estrogens Hives  . Phenobarbital Itching    History reviewed. No pertinent family history.   Prior to Admission medications   Medication Sig Start Date End Date Taking? Authorizing Provider  acetaminophen (TYLENOL) 500 MG tablet Take 1,000 mg by mouth every 6 (six) hours as needed for moderate pain or headache.    Yes Historical Provider, MD  albuterol (PROVENTIL HFA;VENTOLIN HFA) 108 (90 Base) MCG/ACT inhaler Inhale 1-2 puffs into the lungs every 6 (six) hours as needed for wheezing or shortness of breath. 07/28/16  Yes Okey Regal, PA-C  cholecalciferol (VITAMIN D) 1000 units tablet Take 2,000 Units by mouth daily as needed (takes occasionally).    Yes Historical Provider, MD  citalopram (CELEXA) 10 MG tablet Take 10 mg by mouth  daily.   Yes Historical Provider, MD  Cyanocobalamin (VITAMIN B 12 PO) Take 2,500 mcg by mouth once a week.    Yes Historical Provider, MD  esomeprazole (NEXIUM) 20 MG capsule Take 20 mg by mouth daily as needed (acid reflux).   Yes Historical Provider, MD  LORazepam (ATIVAN) 1 MG tablet Take 1 mg by mouth See admin instructions. Takes 1 tab daily, and can take 1 addt'l tab as needed for anxiety   Yes Historical Provider, MD    Physical Exam: Vitals:   08/09/16 1523 08/09/16 1727 08/09/16 1906  BP: 112/58 130/76 109/72  Pulse: 76 92 86  Resp: (!) _0 Temp: 98.6 F (37 C)    TempSrc: Oral    SpO2: 92% 99% 97%      Constitutional: Calm, comfortable, appears chronically-ill, thin Eyes: PERTLA, lids and conjunctivae normal ENMT: Mucous membranes are dry. Posterior pharynx clear of any exudate or lesions.   Neck: normal, supple, no masses, no thyromegaly Respiratory: rhonchi at bilateral bases. Accessory muscle recruitment. No pallor or cyanosis.  Cardiovascular: S1 & S2 heard, regular rate and rhythm. No extremity edema. No significant JVD. Abdomen: No distension, mild generalized tenderness without rebound pain or guarding. Soft, no masses palpated. Bowel sounds normal.  Musculoskeletal: no clubbing / cyanosis. No joint deformity upper and lower extremities. Normal muscle tone.  Skin: no significant rashes, lesions, ulcers. Warm, dry, well-perfused. Poor turgor.  Neurologic: CN 2-12 grossly intact. Sensation intact, DTR normal. Strength 5/5 in all 4 limbs.  Psychiatric: Normal judgment and insight. Alert and oriented x 3. Normal mood and affect.     Labs on Admission: I have personally reviewed following labs and imaging studies  CBC:  Recent Labs Lab 08/09/16 1530 08/09/16 1539  WBC 17.0*  --   NEUTROABS 15.0*  --   HGB 11.6* 11.9*  HCT 35.3* 35.0*  MCV 89.1  --   PLT PLATELET CLUMPS NOTED ON SMEAR, UNABLE TO ESTIMATE  --    Basic Metabolic Panel:  Recent  Labs Lab 08/09/16 1530 08/09/16 1539  NA 132* 135  K 3.8 3.9  CL 100* 101  CO2 23  --   GLUCOSE 188* 192*  BUN 10 10  CREATININE 0.92 0.80  CALCIUM 8.6*  --    GFR: CrCl cannot be calculated (Unknown ideal weight.). Liver Function Tests:  Recent Labs Lab 08/09/16 1530  AST 18  ALT 13*  ALKPHOS 80  BILITOT 0.5  PROT 6.7  ALBUMIN 3.2*    Recent Labs Lab 08/09/16 1530  LIPASE 10*   No results for input(s): AMMONIA in the last 168 hours. Coagulation Profile: No results for input(s): INR, PROTIME in the last 168 hours. Cardiac Enzymes: No results for input(s): CKTOTAL, CKMB, CKMBINDEX, TROPONINI in the last 168 hours. BNP (last 3 results) No results for input(s): PROBNP in the last 8760 hours. HbA1C: No results for input(s): HGBA1C in the last 72 hours. CBG: No results for input(s): GLUCAP in the last 168 hours. Lipid Profile: No results for input(s): CHOL, HDL, LDLCALC, TRIG, CHOLHDL, LDLDIRECT in the last 72 hours. Thyroid Function Tests: No results for input(s): TSH, T4TOTAL, FREET4, T3FREE, THYROIDAB in the last 72 hours. Anemia Panel: No results for input(s): VITAMINB12, FOLATE, FERRITIN, TIBC, IRON, RETICCTPCT in the last 72  hours. Urine analysis:    Component Value Date/Time   COLORURINE YELLOW 08/09/2016 1622   APPEARANCEUR HAZY (A) 08/09/2016 1622   LABSPEC 1.018 08/09/2016 1622   PHURINE 5.0 08/09/2016 1622   GLUCOSEU NEGATIVE 08/09/2016 1622   HGBUR NEGATIVE 08/09/2016 1622   BILIRUBINUR NEGATIVE 08/09/2016 1622   KETONESUR NEGATIVE 08/09/2016 1622   PROTEINUR NEGATIVE 08/09/2016 1622   UROBILINOGEN 1.0 08/11/2014 2239   NITRITE NEGATIVE 08/09/2016 1622   LEUKOCYTESUR NEGATIVE 08/09/2016 1622   Sepsis Labs: _0 (procalcitonin:4,lacticidven:4) )No results found for this or any previous visit (from the past 240 hour(s)).   Radiological Exams on Admission: Ct Abdomen Pelvis W Contrast  Result Date: 08/09/2016 CLINICAL DATA:  Diffuse  abdominal pain and back pain EXAM: CT ABDOMEN AND PELVIS WITH CONTRAST TECHNIQUE: Multidetector CT imaging of the abdomen and pelvis was performed using the standard protocol following bolus administration of intravenous contrast. CONTRAST:  6m ISOVUE-300 IOPAMIDOL (ISOVUE-300) INJECTION 61% COMPARISON:  CT abdomen pelvis of 06/19/2016 FINDINGS: Lower chest: The tree-in-bud appearance of the lung bases is again noted when compared to the prior CT and most likely represents changes of chronic aspiration or possibly atypical infection such as mycobacterium avium complex. No pleural effusion is seen. Again a large hiatal hernia is present containing approximately 30-40% of the stomach. No obstruction is seen. Hepatobiliary: The liver enhances with no new abnormality and no ductal dilatation is seen. Surgical clips are present from prior cholecystectomy. 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 The body and tail the pancreas is unremarkable. Spleen: Spleen is normal in size. Adrenals/Urinary Tract: The adrenal glands are unchanged. A large cyst again emanates from the upper pole of the right kidney and is unchanged. No hydronephrosis is seen. On delayed images, the pelvocaliceal systems are unremarkable. The ureters are not dilated. The urinary bladder is completely decompressed and cannot be assessed. Stomach/Bowel: The stomach view has below the hemidiaphragm is unremarkable with changes of Whipple procedure again noted. No small bowel distention is seen. There is feces throughout the colon. The terminal ileum is not well seen but no abnormality is noted. The appendix is not visualized. Vascular/Lymphatic: The abdominal aorta is normal in caliber with age consistent abdominal aortic atherosclerosis present. No adenopathy is seen. Reproductive: The uterus has previously been resected. No adnexal lesion is seen. Other: There is some free fluid  within the pelvis and a small amount of fluid within the upper abdomen. With no evidence of solid organ injury, this is a nonspecific finding and could be non inflammatory as with hypoproteinemia or possibly due to ascites, inflammation, or perhaps peritonitis and clinical correlation is recommended. There is no evidence of free air to indicate bowel perforation. Musculoskeletal: The bones are diffusely osteopenic with vertebroplasty at T12 and L2. IMPRESSION: 1. Little change in appearance of the lung bases which could indicate chronic aspiration or possibly due to atypical infection as noted above. 2. No change in moderate to large hiatal hernia. 3. Some free fluid within the upper abdomen and pelvis which is nonspecific as described above. Consider noninflammatory cause such as hypoproteinemia versus inflammation, peritonitis, or ascites. 4. Prior Whipple procedure. Electronically Signed   By: PIvar DrapeM.D.   On: 08/09/2016 17:21    EKG: Not performed, will obtain as appropriate.   Assessment/Plan  1. Aspiration pneumonia, acute hypoxic respiratory failure  - Pt presents primarily d/t upper abd pain, but also notes cough and dyspnea, and is  noted to be dyspneic and hypoxic  - Imaging and history suggests aspiration PNA  - She is oxygenating adequately on 2-4 Lpm supplemental O2 and work of breathing has improved  - She was given empiric vancomycin and Zosyn in ED  - Blood cultures have been collected and sputum gram-stain/culture requested  - Continue empiric abx with Unasyn; SLP eval requested   2. Acute on chronic abdominal pain  - Pt has chronic intermittent abdominal pain that has been evaluated by PCP with no clear etiology identified  - CT abd/pelvis features non-specific free-fluid in upper abd and pelvis, possibly reflecting an underlying infectious/inflammatory process  - Abdominal exam is benign  - Plan to continue supportive care with prn analgesia, antiemetic, IVF hydration - Pt  is on empiric Unasyn as above  3. Normocytic anemia  - Hgb is 11.6 on admission, stable relative to priors, and with no evidence for bleeding - Continue B12 supplementation   4. Depression, anxiety  - Appears to be stable - Continue Celexa and Ativan     DVT prophylaxis: SCD's  Code Status: Full  Family Communication: Son updated at bedside Disposition Plan: Admit to med-surg Consults called: None Admission status: Inpatient    Vianne Bulls, MD [facility] Pager [phone]  If 7PM-7AM, please contact night-coverage [email or web address] Password [facility]  08/09/2016, 8:07 PM

## 2016-08-09 NOTE — ED Provider Notes (Signed)
[place] DEPT Provider Note   CSN: [id number] Arrival date & time: 08/09/16  1511     History   Chief Complaint Chief Complaint  Patient presents with  . Abdominal Pain    HPI Mackenzie Mendoza is a 83 y.o. female.  83 yo F with a chief complaint of generalized abdominal pain. Going on for the past couple weeks. Patient feels that it's getting worse. She is unable to provide much other history. Nothing seems to make this better or worse she denies vomiting denies fevers denies diarrhea. Per EMS is been going on for a couple weeks. Little 5 caveat acuity of condition.   The history is provided by the patient.  Abdominal Pain   This is a chronic problem. The current episode started more than 1 week ago. The problem occurs constantly. The problem has not changed since onset.The pain is located in the generalized abdominal region. The quality of the pain is aching and sharp. The pain is at a severity of 10/10. The pain is severe. Pertinent negatives include fever, nausea, vomiting, dysuria, headaches, arthralgias and myalgias. Nothing aggravates the symptoms. Nothing relieves the symptoms.    Past Medical History:  Diagnosis Date  . Cancer [facility])     Patient Active Problem List   Diagnosis Date Noted  . Abdominal pain 08/09/2016  . Aspiration pneumonia ([place]) 08/09/2016  . Depression 08/09/2016  . Acute respiratory failure with hypoxia ([place]) 08/09/2016  . Weight loss 03/05/2012  . 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 Osteoporosis 09/05/2011  . Normocytic anemia 09/05/2011  . Other B-complex deficiencies 09/05/2011    Past Surgical History:  Procedure Laterality Date  . ABDOMINAL HYSTERECTOMY    . CHOLECYSTECTOMY      OB History    No data available       Home Medications    Prior to Admission medications   Medication Sig Start Date End Date Taking? Authorizing Provider  acetaminophen (TYLENOL) 500 MG tablet Take 1,000 mg by mouth every 6 (six)  hours as needed for moderate pain or headache.    Yes Historical Provider, MD  albuterol (PROVENTIL HFA;VENTOLIN HFA) 108 (90 Base) MCG/ACT inhaler Inhale 1-2 puffs into the lungs every 6 (six) hours as needed for wheezing or shortness of breath. 07/28/16  Yes Okey Regal, PA-C  cholecalciferol (VITAMIN D) 1000 units tablet Take 2,000 Units by mouth daily as needed (takes occasionally).    Yes Historical Provider, MD  citalopram (CELEXA) 10 MG tablet Take 10 mg by mouth daily.   Yes Historical Provider, MD  Cyanocobalamin (VITAMIN B 12 PO) Take 2,500 mcg by mouth once a week.    Yes Historical Provider, MD  esomeprazole (NEXIUM) 20 MG capsule Take 20 mg by mouth daily as needed (acid reflux).   Yes Historical Provider, MD  LORazepam (ATIVAN) 1 MG tablet Take 1 mg by mouth See admin instructions. Takes 1 tab daily, and can take 1 addt'l tab as needed for anxiety   Yes Historical Provider, MD    Family History History reviewed. No pertinent family history.  Social History Social History  Substance Use Topics  . Smoking status: Current Every Day Smoker    Packs/day: 1.00    Types: Cigarettes  . Smokeless tobacco: Never Used  . Alcohol use No     Allergies   Aspirin; Codeine-promethazine [promethazine-codeine]; Estrogens; and Phenobarbital   Review of Systems Review of Systems  Constitutional: Negative for chills and fever.  HENT: Negative for congestion and  rhinorrhea.   Eyes: Negative for redness and visual disturbance.  Respiratory: Negative for shortness of breath and wheezing.   Cardiovascular: Negative for chest pain and palpitations.  Gastrointestinal: Positive for abdominal pain. Negative for nausea and vomiting.  Genitourinary: Negative for dysuria and urgency.  Musculoskeletal: Negative for arthralgias and myalgias.  Skin: Negative for pallor and wound.  Neurological: Negative for dizziness and headaches.     Physical Exam Updated Vital Signs BP (!) 100/48 (BP  Location: Right Arm)   Pulse 93   Temp 99.6 F (37.6 C) (Oral)   Resp 18   Ht 5' 6"  (1.676 m)   Wt 98 lb (44.5 kg)   SpO2 100%   BMI 15.82 kg/m   Physical Exam  Constitutional: She is oriented to person, place, and time. She appears well-developed and well-nourished. No distress.  HENT:  Head: Normocephalic and atraumatic.  Eyes: EOM are normal. Pupils are equal, round, and reactive to light.  Neck: Normal range of motion. Neck supple.  Cardiovascular: Normal rate and regular rhythm.  Exam reveals no gallop and no friction rub.   No murmur heard. Pulmonary/Chest: Effort normal. She has no wheezes. She has no rales.  Abdominal: Soft. She exhibits no distension and no mass. There is tenderness (diffuse). There is no guarding.  Musculoskeletal: She exhibits no edema or tenderness.  Neurological: She is alert and oriented to person, place, and time.  Skin: Skin is warm and dry. She is not diaphoretic.  Psychiatric: She has a normal mood and affect. Her behavior is normal.  Nursing note and vitals reviewed.    ED Treatments / Results  Labs (all labs ordered are listed, but only abnormal results are displayed) Labs Reviewed  COMPREHENSIVE METABOLIC PANEL - Abnormal; Notable for the following:       Result Value   Sodium 132 (*)    Chloride 100 (*)    Glucose, Bld 188 (*)    Calcium 8.6 (*)    Albumin 3.2 (*)    ALT 13 (*)    GFR calc non Af Amer 57 (*)    All other components within normal limits  LIPASE, BLOOD - Abnormal; Notable for the following:    Lipase 10 (*)    All other components within normal limits  CBC WITH DIFFERENTIAL/PLATELET - Abnormal; Notable for the following:    WBC 17.0 (*)    Hemoglobin 11.6 (*)    HCT 35.3 (*)    Neutro Abs 15.0 (*)    All other components within normal limits  URINALYSIS, ROUTINE W REFLEX MICROSCOPIC - Abnormal; Notable for the following:    APPearance HAZY (*)    All other components within normal limits  GLUCOSE, CAPILLARY -  Abnormal; Notable for the following:    Glucose-Capillary 179 (*)    All other components within normal limits  I-STAT CHEM 8, ED - Abnormal; Notable for the following:    Glucose, Bld 192 (*)    Calcium, Ion 1.10 (*)    Hemoglobin 11.9 (*)    HCT 35.0 (*)    All other components within normal limits  CULTURE, BLOOD (ROUTINE X 2)  URINE CULTURE  CULTURE, BLOOD (ROUTINE X 2)  CULTURE, EXPECTORATED SPUTUM-ASSESSMENT  GRAM STAIN  GLUCOSE, CAPILLARY  HIV ANTIBODY (ROUTINE TESTING)  STREP PNEUMONIAE URINARY ANTIGEN  HEMOGLOBIN A1C  CBC WITH DIFFERENTIAL/PLATELET  BASIC METABOLIC PANEL  I-STAT CG4 LACTIC ACID, ED    EKG  EKG Interpretation None       Radiology  Ct Abdomen Pelvis W Contrast  Result Date: 08/09/2016 CLINICAL DATA:  Diffuse abdominal pain and back pain EXAM: CT ABDOMEN AND PELVIS WITH CONTRAST TECHNIQUE: Multidetector CT imaging of the abdomen and pelvis was performed using the standard protocol following bolus administration of intravenous contrast. CONTRAST:  46m ISOVUE-300 IOPAMIDOL (ISOVUE-300) INJECTION 61% COMPARISON:  CT abdomen pelvis of 06/19/2016 FINDINGS: Lower chest: The tree-in-bud appearance of the lung bases is again noted when compared to the prior CT and most likely represents changes of chronic aspiration or possibly atypical infection such as mycobacterium avium complex. No pleural effusion is seen. Again a large hiatal hernia is present containing approximately 30-40% of the stomach. No obstruction is seen. Hepatobiliary: The liver enhances with no new abnormality and no ductal dilatation is seen. Surgical clips are present from prior cholecystectomy. 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 The body and tail the pancreas is unremarkable. Spleen: Spleen is normal in size. Adrenals/Urinary Tract: The adrenal glands are unchanged. A large cyst again emanates from the upper pole of the  right kidney and is unchanged. No hydronephrosis is seen. On delayed images, the pelvocaliceal systems are unremarkable. The ureters are not dilated. The urinary bladder is completely decompressed and cannot be assessed. Stomach/Bowel: The stomach view has below the hemidiaphragm is unremarkable with changes of Whipple procedure again noted. No small bowel distention is seen. There is feces throughout the colon. The terminal ileum is not well seen but no abnormality is noted. The appendix is not visualized. Vascular/Lymphatic: The abdominal aorta is normal in caliber with age consistent abdominal aortic atherosclerosis present. No adenopathy is seen. Reproductive: The uterus has previously been resected. No adnexal lesion is seen. Other: There is some free fluid within the pelvis and a small amount of fluid within the upper abdomen. With no evidence of solid organ injury, this is a nonspecific finding and could be non inflammatory as with hypoproteinemia or possibly due to ascites, inflammation, or perhaps peritonitis and clinical correlation is recommended. There is no evidence of free air to indicate bowel perforation. Musculoskeletal: The bones are diffusely osteopenic with vertebroplasty at T12 and L2. IMPRESSION: 1. Little change in appearance of the lung bases which could indicate chronic aspiration or possibly due to atypical infection as noted above. 2. No change in moderate to large hiatal hernia. 3. Some free fluid within the upper abdomen and pelvis which is nonspecific as described above. Consider noninflammatory cause such as hypoproteinemia versus inflammation, peritonitis, or ascites. 4. Prior Whipple procedure. Electronically Signed   By: PIvar DrapeM.D.   On: 08/09/2016 17:21    Procedures Procedures (including critical care time)  Medications Ordered in ED Medications  iopamidol (ISOVUE-300) 61 % injection (not administered)  citalopram (CELEXA) tablet 10 mg (not administered)    cholecalciferol (VITAMIN D) tablet 2,000 Units (not administered)  pantoprazole (PROTONIX) EC tablet 40 mg (not administered)  LORazepam (ATIVAN) tablet 1 mg (not administered)  insulin aspart (novoLOG) injection 0-9 Units (0 Units Subcutaneous Not Given 08/09/16 2353)  0.9 %  sodium chloride infusion ( Intravenous New Bag/Given 08/09/16 2107)  Ampicillin-Sulbactam (UNASYN) 3 g in sodium chloride 0.9 % 100 mL IVPB (3 g Intravenous Given 08/09/16 2339)  ipratropium-albuterol (DUONEB) 0.5-2.5 (3) MG/3ML nebulizer solution 3 mL (not administered)  sodium chloride 0.9 % bolus 1,000 mL (0 mLs Intravenous Stopped 08/09/16 1823)  iopamidol (ISOVUE-300) 61 % injection (30 mLs  Contrast Given 08/09/16 1544)  iopamidol (  ISOVUE-300) 61 % injection 100 mL (80 mLs Intravenous Contrast Given 08/09/16 1642)  piperacillin-tazobactam (ZOSYN) IVPB 3.375 g (0 g Intravenous Stopped 08/09/16 1906)  vancomycin (VANCOCIN) IVPB 1000 mg/200 mL premix (1,000 mg Intravenous Transfusing/Transfer 08/09/16 2003)  morphine 4 MG/ML injection 2 mg (2 mg Intravenous Given 08/09/16 1907)  ondansetron (ZOFRAN) injection 4 mg (4 mg Intravenous Given 08/09/16 1907)     Initial Impression / Assessment and Plan / ED Course  I have reviewed the triage vital signs and the nursing notes.  Pertinent labs & imaging results that were available during my care of the patient were reviewed by me and considered in my medical decision making (see chart for details).     83 yo F With a chief complaint of abdominal pain. Will obtain labs CT.  CT scan with free fluid in the abdomen. Described by the radiologist as nonspecific. Patient with a elevated white count temperature of 99.6. I will treat as this is a acute infection. Also with concern for aspiration pneumonia on CT. Given vancomycin and Zosyn.  The patients results and plan were reviewed and discussed.   Any x-rays performed were independently reviewed by myself.   Differential diagnosis  were considered with the presenting HPI.  Medications  iopamidol (ISOVUE-300) 61 % injection (not administered)  citalopram (CELEXA) tablet 10 mg (not administered)  cholecalciferol (VITAMIN D) tablet 2,000 Units (not administered)  pantoprazole (PROTONIX) EC tablet 40 mg (not administered)  LORazepam (ATIVAN) tablet 1 mg (not administered)  insulin aspart (novoLOG) injection 0-9 Units (0 Units Subcutaneous Not Given 08/09/16 2353)  0.9 %  sodium chloride infusion ( Intravenous New Bag/Given 08/09/16 2107)  Ampicillin-Sulbactam (UNASYN) 3 g in sodium chloride 0.9 % 100 mL IVPB (3 g Intravenous Given 08/09/16 2339)  ipratropium-albuterol (DUONEB) 0.5-2.5 (3) MG/3ML nebulizer solution 3 mL (not administered)  sodium chloride 0.9 % bolus 1,000 mL (0 mLs Intravenous Stopped 08/09/16 1823)  iopamidol (ISOVUE-300) 61 % injection (30 mLs  Contrast Given 08/09/16 1544)  iopamidol (ISOVUE-300) 61 % injection 100 mL (80 mLs Intravenous Contrast Given 08/09/16 1642)  piperacillin-tazobactam (ZOSYN) IVPB 3.375 g (0 g Intravenous Stopped 08/09/16 1906)  vancomycin (VANCOCIN) IVPB 1000 mg/200 mL premix (1,000 mg Intravenous Transfusing/Transfer 08/09/16 2003)  morphine 4 MG/ML injection 2 mg (2 mg Intravenous Given 08/09/16 1907)  ondansetron (ZOFRAN) injection 4 mg (4 mg Intravenous Given 08/09/16 1907)    Vitals:   08/09/16 1727 08/09/16 1906 08/09/16 2043 08/09/16 2244  BP: 130/76 109/72 (!) 117/57 (!) 100/48  Pulse: 92 86 (!) 53 93  Resp: 18 18 16 18   Temp:   100 F (37.8 C) 99.6 F (37.6 C)  TempSrc:   Oral Oral  SpO2: 99% 97% 100% 100%  Weight:   98 lb (44.5 kg)   Height:   5' 6"  (1.676 m)     Final diagnoses:  Generalized abdominal pain    Admission/ observation were discussed with the admitting physician, patient and/or family and they are comfortable with the plan.    Final Clinical Impressions(s) / ED Diagnoses   Final diagnoses:  Generalized abdominal pain    New  Prescriptions Current Discharge Medication List       Deno Etienne, DO 08/10/16 9150

## 2016-08-09 NOTE — ED Triage Notes (Signed)
EMS were called by her family, with whom she lives re: abd. Pain waxing and waning x 2 weeks--very much worse today. She rec'd. 150 mcg of Fentanyl en route to hospital.

## 2016-08-09 NOTE — Progress Notes (Addendum)
Pharmacy Antibiotic Note  Mackenzie Mendoza is a 83 y.o. female admitted on 08/09/2016 with aspiration PNA.  Pharmacy has been consulted for Unasyn dosing. Zosyn/vanc x 1 in ED.  Plan:  Unasyn 3g IV q8 hr  Follow clinical course, renal function, culture results as available  Follow for de-escalation of antibiotics and LOT   Height: 5\' 6"  (167.6 cm) Weight: 98 lb (44.5 kg) IBW/kg (Calculated) : 59.3  Temp (24hrs), Avg:99.3 F (37.4 C), Min:98.6 F (37 C), Max:100 F (37.8 C)   Recent Labs Lab 08/09/16 1530 08/09/16 1539 08/09/16 1540  WBC 17.0*  --   --   CREATININE 0.92 0.80  --   LATICACIDVEN  --   --  1.50    Estimated Creatinine Clearance: 39.4 mL/min (by C-G formula based on SCr of 0.8 mg/dL).    Allergies  Allergen Reactions  . Aspirin Hives  . Codeine-Promethazine [Promethazine-Codeine] Nausea And Vomiting  . Estrogens Hives  . Phenobarbital Itching    Thank you for allowing pharmacy to be a part of this patient's care.  Reuel Boom, PharmD, BCPS Pager: [phone] 08/09/2016, 9:50 PM

## 2016-08-09 NOTE — ED Notes (Signed)
Bed: WA15 Expected date:  Expected time:  Means of arrival:  Comments: ems 

## 2016-08-10 DIAGNOSIS — J690 Pneumonitis due to inhalation of food and vomit: Principal | ICD-10-CM

## 2016-08-10 DIAGNOSIS — R1010 Upper abdominal pain, unspecified: Secondary | ICD-10-CM

## 2016-08-10 LAB — CBC WITH DIFFERENTIAL/PLATELET
BASOS PCT: 0 %
Basophils Absolute: 0 10*3/uL (ref 0.0–0.1)
EOS PCT: 0 %
Eosinophils Absolute: 0 10*3/uL (ref 0.0–0.7)
HEMATOCRIT: 27.9 % — AB (ref 36.0–46.0)
HEMOGLOBIN: 9.6 g/dL — AB (ref 12.0–15.0)
LYMPHS PCT: 4 %
Lymphs Abs: 1 10*3/uL (ref 0.7–4.0)
MCH: 30.3 pg (ref 26.0–34.0)
MCHC: 34.4 g/dL (ref 30.0–36.0)
MCV: 88 fL (ref 78.0–100.0)
MONOS PCT: 4 %
Monocytes Absolute: 1 10*3/uL (ref 0.1–1.0)
Neutro Abs: 24 10*3/uL — ABNORMAL HIGH (ref 1.7–7.7)
Neutrophils Relative %: 92 %
Platelets: 233 10*3/uL (ref 150–400)
RBC: 3.17 MIL/uL — ABNORMAL LOW (ref 3.87–5.11)
RDW: 14.5 % (ref 11.5–15.5)
WBC: 26 10*3/uL — ABNORMAL HIGH (ref 4.0–10.5)

## 2016-08-10 LAB — GLUCOSE, CAPILLARY
GLUCOSE-CAPILLARY: 129 mg/dL — AB (ref 65–99)
Glucose-Capillary: 119 mg/dL — ABNORMAL HIGH (ref 65–99)
Glucose-Capillary: 122 mg/dL — ABNORMAL HIGH (ref 65–99)
Glucose-Capillary: 87 mg/dL (ref 65–99)
Glucose-Capillary: 83 mg/dL (ref 65–99)

## 2016-08-10 LAB — URINE CULTURE: Culture: NO GROWTH

## 2016-08-10 LAB — BASIC METABOLIC PANEL
Anion gap: 5 (ref 5–15)
BUN: 11 mg/dL (ref 6–20)
CO2: 25 mmol/L (ref 22–32)
Calcium: 7.8 mg/dL — ABNORMAL LOW (ref 8.9–10.3)
Chloride: 107 mmol/L (ref 101–111)
Creatinine, Ser: 0.84 mg/dL (ref 0.44–1.00)
GFR calc Af Amer: >60 mL/min (ref >60)
GFR calc non Af Amer: >60 mL/min (ref >60)
Glucose, Bld: 135 mg/dL — ABNORMAL HIGH (ref 65–99)
Potassium: 4.2 mmol/L (ref 3.5–5.1)
Sodium: 137 mmol/L (ref 135–145)

## 2016-08-10 LAB — STREP PNEUMONIAE URINARY ANTIGEN: Strep Pneumo Urinary Antigen: NEGATIVE

## 2016-08-10 LAB — HIV ANTIBODY (ROUTINE TESTING W REFLEX): HIV SCREEN 4TH GENERATION: NONREACTIVE

## 2016-08-10 MED ORDER — MORPHINE SULFATE (PF) 4 MG/ML IV SOLN
1.0000 mg | INTRAVENOUS | Status: DC | PRN
  Administered 2016-08-10 – 2016-08-12 (×5): 1 mg via INTRAVENOUS
  Filled 2016-08-10 (×5): qty 1

## 2016-08-10 MED ORDER — DEXTROSE 5 % IV SOLN
500.0000 mg | Freq: Every day | INTRAVENOUS | Status: DC
  Administered 2016-08-10 – 2016-08-12 (×3): 500 mg via INTRAVENOUS
  Filled 2016-08-10 (×3): qty 500

## 2016-08-10 MED ORDER — SODIUM CHLORIDE 0.9 % IV SOLN
INTRAVENOUS | Status: DC
  Administered 2016-08-10 – 2016-08-11 (×2): via INTRAVENOUS

## 2016-08-10 MED ORDER — LIP MEDEX EX OINT
TOPICAL_OINTMENT | CUTANEOUS | Status: AC
  Filled 2016-08-10: qty 7

## 2016-08-10 MED ORDER — PIPERACILLIN-TAZOBACTAM 3.375 G IVPB
3.3750 g | Freq: Three times a day (TID) | INTRAVENOUS | Status: DC
  Administered 2016-08-10 – 2016-08-14 (×13): 3.375 g via INTRAVENOUS
  Filled 2016-08-10 (×15): qty 50

## 2016-08-10 MED ORDER — PANTOPRAZOLE SODIUM 40 MG IV SOLR
40.0000 mg | Freq: Two times a day (BID) | INTRAVENOUS | Status: DC
  Administered 2016-08-10 – 2016-08-12 (×6): 40 mg via INTRAVENOUS
  Filled 2016-08-10 (×6): qty 40

## 2016-08-10 NOTE — Progress Notes (Signed)
Swallow study has been cancelled for patient due to NPO status. Will reschedule after patient's bowel rest is complete.

## 2016-08-10 NOTE — Progress Notes (Signed)
Initial Nutrition Assessment  DOCUMENTATION CODES:   Severe malnutrition in context of chronic illness, Underweight  INTERVENTION:   Diet advancement per MD If diet unable to be advanced soon, will need to consider nutrition support given pt is severely malnourished and has not eating solid food x 2 weeks.  When diet advanced, provide Boost Breeze po TID, each supplement provides 250 kcal and 9 grams of protein  RD will continue to monitor for plan  NUTRITION DIAGNOSIS:   Malnutrition related to chronic illness as evidenced by severe depletion of body fat, severe depletion of muscle mass, energy intake < 75% for > or equal to 1 month.  GOAL:   Patient will meet greater than or equal to 90% of their needs  MONITOR:   Diet advancement, Labs, Weight trends, I & O's  REASON FOR ASSESSMENT:   Malnutrition Screening Tool    ASSESSMENT:   83yo female PMH GERD, hiatal hernia, h/o Whipple, who presented to [facility] yesterday with complaints of acute onset worsening abdominal pain. Per chart she has been complaining of a vague abdominal pain for a few months, it has been a little worse x2 weeks, and yesterday it became more severe. Pain is global about the abdomen and feels sharp and severe. Worse with movement. She reports decreased appetite and 10lb weight loss in about 2 weeks.   Patient currently NPO, pending GI evaluation. SLP has been consulted as well. Pt states she has had very poor appetite over the past 3-4 weeks d/t abdominal pain that began to worsen especially in the last 2 weeks. Pt cannot remember the last time she ate solid food, states during this time she was mainly consuming fluids. Pt is agreeable to trying Boost Breeze when diet is advanced. If diet is unable to be advanced soon, will need to consider nutrition support if within [place].  Pt unable to states UBW, states she has been underweight for years now. Per chart review, pt has lost 7 lb since June 2017, which is  insignificant for time frame. Nutrition-Focused physical exam completed. Findings are severe fat depletion, severe muscle depletion, and no edema.   Medications: IV Protonix every 12 hours Labs reviewed: CBGs: 122-129  Diet Order:  Diet NPO time specified Except for: [employer], Sips with Meds  Skin:  Reviewed, no issues  Last BM:  PTA  Height:   Ht Readings from Last 1 Encounters:  08/09/16 5\' 6"  (1.676 m)    Weight:   Wt Readings from Last 1 Encounters:  08/09/16 98 lb (44.5 kg)    Ideal Body Weight:  59.1 kg  BMI:  Body mass index is 15.82 kg/m.  Estimated Nutritional Needs:   Kcal:  1350-1550  Protein:  60-70g  Fluid:  1.5L/day  EDUCATION NEEDS:   No education needs identified at this time  Clayton Bibles, MS, RD, LDN Pager: [phone] After Hours Pager: [phone]

## 2016-08-10 NOTE — Progress Notes (Signed)
PROGRESS NOTE    Mackenzie Mendoza  WGN:[id number] DOB: 1934-01-10 DOA: 08/09/2016 PCP: Irven Shelling, MD   Brief Narrative: Mackenzie Mendoza is a 83 y.o. 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 Patient is a very poor historian and history is augmented by the report of her son at the bedside. She has reportedly been suffering from vague generalized abdominal discomfort for several months with no alleviating or exacerbating factors identified. Pain is worsened significantly over the past 2 weeks, and particularly over the past couple days. She now describes the pain as severe, sharp, localized to the upper quadrants and epigastrium, with no identifiable alleviating or exacerbating factors, no nausea, and no vomiting or diarrhea. Patient reports frequent chills.  She endorses a nonproductive cough and exertional dyspnea, worsening insidiously over the past week or more.    Assessment & Plan:   Principal Problem:   Aspiration pneumonia ([place]) Active Problems:   Cancer of ampulla of Vater ([place])   Normocytic anemia   Abdominal pain   Depression   Acute respiratory failure with hypoxia ([facility])   1-Aspiration PNA;  Continue with Zosyn. Add atypical coverage.  Follow Blood cultures.  Worsening WBC, add azithromycin for atypical coverage.  Continue with IV fluids.   2-Abdominal pain, acute on chronic;  CT with non specific free fluid in abdomen, differential peritonitis, vs hypoalbuminemia. Patient with persistent abdominal pain, leukocytosis, fever. Will consult General sx for evaluation.  Add protonix.   3-Anemia; follow trend.  Continue Celexa and Ativan    Depression;  DVT prophylaxis: SCD. Add lovenox.  Code  Status: Full code.  Family Communication: care discussed with patient.  Disposition Plan: remain inpati9ent for IV antibiotics.  Consultants:   General sx.    Procedures: none   Antimicrobials;  Zosyn 3-15  Azithromycin. 3-15     Subjective: Still does not feels well. Report abdominal pain. Lower quadrant. Pain a little better than yesterday. No BM since 1 week ago.   Objective: Vitals:   08/09/16 1906 08/09/16 2043 08/09/16 2244 08/10/16 0548  BP: 109/72 (!) 117/57 (!) 100/48 (!) 96/51  Pulse: 86 (!) 53 93 80  Resp: 18 16 18 15   Temp:  100 F (37.8 C) 99.6 F (37.6 C) 98.8 F (37.1 C)  TempSrc:  Oral Oral Oral  SpO2: 97% 100% 100% 99%  Weight:  44.5 kg (98 lb)    Height:  5' 6"  (1.676 m)      Intake/Output Summary (Last 24 hours) at 08/10/16 0808 Last data filed at 08/10/16 0700  Gross per 24 hour  Intake            729.5 ml  Output              350 ml  Net            379.5 ml   Filed Weights   08/09/16 2043  Weight: 44.5 kg (98 lb)    Examination:  General exam: Appears calm and comfortable  Respiratory system: Clear to auscultation. Respiratory effort normal. Cardiovascular system: S1 & S2 heard, RRR. No JVD, murmurs, rubs, gallops or clicks. No pedal edema. Gastrointestinal system: Abdomen is nondistended, soft, very tender lower quadrant.  No organomegaly or masses felt. Normal bowel sounds heard. Central nervous system: Alert and oriented.  No focal neurological deficits. Extremities: Symmetric 5 x 5 power. Skin: No rashes, lesions or ulcers Psychiatry: Judgement and insight appear normal. Mood & affect appropriate.     Data Reviewed: I have personally reviewed following labs and imaging studies  CBC:  Recent Labs Lab 08/09/16 1530 08/09/16 1539 08/10/16 0444  WBC 17.0*  --  26.0*  NEUTROABS 15.0*  --  24.0*  HGB 11.6* 11.9* 9.6*  HCT 35.3* 35.0* 27.9*  MCV 89.1  --  88.0  PLT PLATELET CLUMPS NOTED ON SMEAR, UNABLE TO ESTIMATE  --   824   Basic Metabolic Panel:  Recent Labs Lab 08/09/16 1530 08/09/16 1539 08/10/16 0444  NA 132* 135 137  K 3.8 3.9 4.2  CL 100* 101 107  CO2 23  --  25  GLUCOSE 188* 192* 135*  BUN 10 10 11   CREATININE 0.92 0.80 0.84  CALCIUM 8.6*  --  7.8*   GFR: Estimated Creatinine Clearance: 37.5 mL/min (by C-G formula based on SCr of 0.84 mg/dL). Liver Function Tests:  Recent Labs Lab 08/09/16 1530  AST 18  ALT 13*  ALKPHOS 80  BILITOT 0.5  PROT 6.7  ALBUMIN 3.2*    Recent Labs Lab 08/09/16 1530  LIPASE 10*   No results for input(s): AMMONIA in the last 168 hours. Coagulation Profile: No results for input(s): INR, PROTIME in the last 168 hours. Cardiac Enzymes: No results for input(s): CKTOTAL, CKMB, CKMBINDEX, TROPONINI in the last 168 hours. BNP (last 3 results) No results for input(s): PROBNP in the last 8760 hours. HbA1C: No results for input(s): HGBA1C in the last 72 hours. CBG:  Recent Labs Lab 08/09/16 2055 08/09/16 2350 08/10/16 0408 08/10/16 0731  GLUCAP 179* 79 119* 122*   Lipid Profile: No results for input(s): CHOL, HDL, LDLCALC, TRIG, CHOLHDL, LDLDIRECT in the last 72 hours. Thyroid Function Tests: No results for input(s): TSH, T4TOTAL, FREET4, T3FREE, THYROIDAB in the last 72 hours. Anemia Panel: No results for input(s): VITAMINB12, FOLATE, FERRITIN, TIBC, IRON, RETICCTPCT in the last 72 hours. Sepsis Labs:  Recent Labs Lab 08/09/16 1540  LATICACIDVEN 1.50    Recent Results (from the past 240 hour(s))  Blood culture (routine x 2)     Status: None (Preliminary result)   Collection Time: 08/09/16  6:20 PM  Result Value Ref Range Status   Specimen Description   Final    BLOOD RIGHT FOREARM Performed at [facility], [place] [address] [place], [place] [address]    Special Requests BOTTLES DRAWN AEROBIC AND ANAEROBIC 5CC  Final   Culture PENDING  Incomplete   Report Status PENDING  Incomplete         Radiology Studies: Ct  Abdomen Pelvis W Contrast  Result Date: 08/09/2016 CLINICAL DATA:  Diffuse abdominal pain and back pain EXAM: CT ABDOMEN AND PELVIS WITH CONTRAST TECHNIQUE: Multidetector CT imaging of the abdomen and pelvis was performed using the standard protocol following bolus administration of intravenous contrast. CONTRAST:  68m ISOVUE-300 IOPAMIDOL (ISOVUE-300) INJECTION 61% COMPARISON:  CT abdomen pelvis of 06/19/2016 FINDINGS: Lower chest: The tree-in-bud appearance of the lung bases is again noted when compared to the prior CT and most likely represents changes of chronic aspiration or possibly atypical infection such as mycobacterium avium complex. No pleural effusion is seen. Again a large hiatal hernia is present containing approximately 30-40% of the stomach. No obstruction is seen. Hepatobiliary: The liver enhances with no new abnormality and no ductal dilatation is seen. Surgical clips are present from  prior cholecystectomy. 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 The body and tail the pancreas is unremarkable. Spleen: Spleen is normal in size. Adrenals/Urinary Tract: The adrenal glands are unchanged. A large cyst again emanates from the upper pole of the right kidney and is unchanged. No hydronephrosis is seen. On delayed images, the pelvocaliceal systems are unremarkable. The ureters are not dilated. The urinary bladder is completely decompressed and cannot be assessed. Stomach/Bowel: The stomach view has below the hemidiaphragm is unremarkable with changes of Whipple procedure again noted. No small bowel distention is seen. There is feces throughout the colon. The terminal ileum is not well seen but no abnormality is noted. The appendix is not visualized. Vascular/Lymphatic: The abdominal aorta is normal in caliber with age consistent abdominal aortic atherosclerosis present. No adenopathy is seen. Reproductive: The uterus has  previously been resected. No adnexal lesion is seen. Other: There is some free fluid within the pelvis and a small amount of fluid within the upper abdomen. With no evidence of solid organ injury, this is a nonspecific finding and could be non inflammatory as with hypoproteinemia or possibly due to ascites, inflammation, or perhaps peritonitis and clinical correlation is recommended. There is no evidence of free air to indicate bowel perforation. Musculoskeletal: The bones are diffusely osteopenic with vertebroplasty at T12 and L2. IMPRESSION: 1. Little change in appearance of the lung bases which could indicate chronic aspiration or possibly due to atypical infection as noted above. 2. No change in moderate to large hiatal hernia. 3. Some free fluid within the upper abdomen and pelvis which is nonspecific as described above. Consider noninflammatory cause such as hypoproteinemia versus inflammation, peritonitis, or ascites. 4. Prior Whipple procedure. Electronically Signed   By: Ivar Drape M.D.   On: 08/09/2016 17:21        Scheduled Meds: . azithromycin  500 mg Intravenous Q24H  . cholecalciferol  2,000 Units Oral Daily  . citalopram  10 mg Oral Daily  . insulin aspart  0-9 Units Subcutaneous Q4H  . pantoprazole  40 mg Oral Daily   Continuous Infusions: . sodium chloride       LOS: 1 day    Time spent: 35 minutes.     Elmarie Shiley, MD [facility] Pager [phone]  If 7PM-7AM, please contact night-coverage [email or web address] Password [email or web address] 08/10/2016, 8:08 AM

## 2016-08-10 NOTE — Evaluation (Addendum)
SLP Cancellation Note  Patient Details Name: Mackenzie Mendoza MRN: [id number] DOB: 1933-07-26   Cancelled treatment:       Reason Eval/Treat Not Completed: Other (comment);Medical issues which prohibited therapy (pt npo- note possibly for GI referral and extensive GI History; phoned RN to made her aware of SLP order).   Luanna Salk, [facility] SLP [phone]

## 2016-08-10 NOTE — Progress Notes (Addendum)
Pharmacy Antibiotic Note  Mackenzie Mendoza is a 83 y.o. female admitted on 08/09/2016 with aspiration PNA.  Pharmacy has been consulted for Zosyn dosing.   Plan: Zosyn 3.375g IV q8h (4 hour infusion time) for CrCl>20 ml/min. Since renal function appears stable, do not anticipate further dose adjustments. Pharmacy will sign off at this time. Please re-consult if needed.  Height: 5\' 6"  (167.6 cm) Weight: 98 lb (44.5 kg) IBW/kg (Calculated) : 59.3  Temp (24hrs), Avg:99.3 F (37.4 C), Min:98.6 F (37 C), Max:100 F (37.8 C)   Recent Labs Lab 08/09/16 1530 08/09/16 1539 08/09/16 1540 08/10/16 0444  WBC 17.0*  --   --  26.0*  CREATININE 0.92 0.80  --  0.84  LATICACIDVEN  --   --  1.50  --     Estimated Creatinine Clearance: 37.5 mL/min (by C-G formula based on SCr of 0.84 mg/dL).    Allergies  Allergen Reactions  . Aspirin Hives  . Codeine-Promethazine [Promethazine-Codeine] Nausea And Vomiting  . Estrogens Hives  . Phenobarbital Itching   Antimicrobials this admission:  3/14 Unasyn >> 3/15 3/15 Zosyn >> 3/15 azith >>  Dose adjustments this admission:   Microbiology results:  3/14 BCx:  3/14 UCx: 3/14 strep pneumo ur ag: neg  Thank you for allowing pharmacy to be a part of this patient's care.  Hershal Coria, PharmD, BCPS Pager: [phone] 08/10/2016 8:42 AM

## 2016-08-10 NOTE — Consult Note (Signed)
[facility] Surgery Consult Note  Mackenzie Mendoza Nov 03, 1933  [id number].    Requesting MD: Regalado Chief Complaint/Reason for Consult: Abdominal pain  HPI:  Mackenzie Mendoza is an 83yo female PMH GERD, hiatal hernia, h/o Whipple, who presented to [facility] yesterday with complaints of acute onset worsening abdominal pain. Per chart she has been complaining of a vague abdominal pain for a few months, it has been a little worse x2 weeks, and yesterday it became more severe. Pain is global about the abdomen and feels sharp and severe. Worse with movement. She reports decreased appetite and 10lb weight loss in about 2 weeks. Denies nausea, vomiting, dyspepsia, fever, chills, diarrhea, hematochezia, melena, or dysuria. Per patient last BM 2 weeks ago. She also admits to a nonproductive cough and exertional dyspnea. Denies CP.  Hospital workup: - WBC 17 on arrival, up to 26 today - CT of the abdomen and pelvis showed changes at the lung bases suggestive of aspiration pneumonia or atypical infection, as well as nonspecific free fluid in the upper abdomen and pelvis possibly 2/2 to hypoproteinemia versus inflammation, peritonitis, or ascites    PMH significant for GERD, hiatal hernia, depression/anxiety, chronic anemia Abdominal surgical history includes Whipple for ampulla of vater tumor 2008 by Dr. Doyle Askew at [facility]; hysterectomy Anticoagulants: none Smokes 1 PPD Lives at home with husband and daughter  ROS: Review of Systems  Constitutional: Positive for malaise/fatigue and weight loss.  HENT: Negative.   Eyes: Negative.   Respiratory: Positive for cough. Negative for sputum production.   Cardiovascular: Negative.   Gastrointestinal: Positive for abdominal pain and constipation. Negative for blood in stool, diarrhea, heartburn, melena, nausea and vomiting.  Genitourinary: Negative.   Musculoskeletal: Negative.   Skin: Negative.   Neurological: Positive for weakness.  All systems  reviewed and otherwise negative except for as above  History reviewed. No pertinent family history.  Past Medical History:  Diagnosis Date  . Cancer [facility])     Past Surgical History:  Procedure Laterality Date  . ABDOMINAL HYSTERECTOMY    . CHOLECYSTECTOMY      Social History:  reports that she has been smoking Cigarettes.  She has been smoking about 1.00 pack per day. She has never used smokeless tobacco. She reports that she does not drink alcohol or use drugs.  Allergies:  Allergies  Allergen Reactions  . Aspirin Hives  . Codeine-Promethazine [Promethazine-Codeine] Nausea And Vomiting  . Estrogens Hives  . Phenobarbital Itching    Medications Prior to Admission  Medication Sig Dispense Refill  . acetaminophen (TYLENOL) 500 MG tablet Take 1,000 mg by mouth every 6 (six) hours as needed for moderate pain or headache.     . albuterol (PROVENTIL HFA;VENTOLIN HFA) 108 (90 Base) MCG/ACT inhaler Inhale 1-2 puffs into the lungs every 6 (six) hours as needed for wheezing or shortness of breath. 1 Inhaler 0  . cholecalciferol (VITAMIN D) 1000 units tablet Take 2,000 Units by mouth daily as needed (takes occasionally).     . citalopram (CELEXA) 10 MG tablet Take 10 mg by mouth daily.    . Cyanocobalamin (VITAMIN B 12 PO) Take 2,500 mcg by mouth once a week.     . esomeprazole (NEXIUM) 20 MG capsule Take 20 mg by mouth daily as needed (acid reflux).    . LORazepam (ATIVAN) 1 MG tablet Take 1 mg by mouth See admin instructions. Takes 1 tab daily, and can take 1 addt'l tab as needed for anxiety      Prior to  Admission medications   Medication Sig Start Date End Date Taking? Authorizing Provider  acetaminophen (TYLENOL) 500 MG tablet Take 1,000 mg by mouth every 6 (six) hours as needed for moderate pain or headache.    Yes Historical Provider, MD  albuterol (PROVENTIL HFA;VENTOLIN HFA) 108 (90 Base) MCG/ACT inhaler Inhale 1-2 puffs into the lungs every 6 (six) hours as needed for wheezing  or shortness of breath. 07/28/16  Yes Okey Regal, PA-C  cholecalciferol (VITAMIN D) 1000 units tablet Take 2,000 Units by mouth daily as needed (takes occasionally).    Yes Historical Provider, MD  citalopram (CELEXA) 10 MG tablet Take 10 mg by mouth daily.   Yes Historical Provider, MD  Cyanocobalamin (VITAMIN B 12 PO) Take 2,500 mcg by mouth once a week.    Yes Historical Provider, MD  esomeprazole (NEXIUM) 20 MG capsule Take 20 mg by mouth daily as needed (acid reflux).   Yes Historical Provider, MD  LORazepam (ATIVAN) 1 MG tablet Take 1 mg by mouth See admin instructions. Takes 1 tab daily, and can take 1 addt'l tab as needed for anxiety   Yes Historical Provider, MD    Blood pressure (!) 96/51, pulse 80, temperature 98.8 F (37.1 C), temperature source Oral, resp. rate 15, height _0  (1.676 m), weight 98 lb (44.5 kg), SpO2 99 %. Physical Exam: General: pleasant, cachectic white female who is laying in bed in NAD HEENT: head is normocephalic, atraumatic.  Sclera are noninjected.  Mouth is dry Heart: regular, rate, and rhythm.  No obvious murmurs, gallops, or rubs noted.  Palpable pedal pulses bilaterally Lungs: CTAB, decreased lung sounds bilateral bases, no wheezes, rhonchi, or rales noted.  Respiratory effort nonlabored Abd: well healed midline incision, soft, ND, +BS, no masses or organomegaly. Globally tender, seems most tender epigastric and LUQ MS: all 4 extremities are symmetrical with no cyanosis, clubbing, or edema. Skin: warm and dry with no masses, lesions, or rashes Psych: A&Ox3 with an appropriate affect. Neuro: CM 2-12 intact, extremity CSM intact bilaterally, normal speech  Results for orders placed or performed during the hospital encounter of 08/09/16 (from the past 48 hour(s))  Comprehensive metabolic panel     Status: Abnormal   Collection Time: 08/09/16  3:30 PM  Result Value Ref Range   Sodium 132 (L) 135 - 145 mmol/L   Potassium 3.8 3.5 - 5.1 mmol/L   Chloride  100 (L) 101 - 111 mmol/L   CO2 23 22 - 32 mmol/L   Glucose, Bld 188 (H) 65 - 99 mg/dL   BUN 10 6 - 20 mg/dL   Creatinine, Ser 0.92 0.44 - 1.00 mg/dL   Calcium 8.6 (L) 8.9 - 10.3 mg/dL   Total Protein 6.7 6.5 - 8.1 g/dL   Albumin 3.2 (L) 3.5 - 5.0 g/dL   AST 18 15 - 41 U/L   ALT 13 (L) 14 - 54 U/L   Alkaline Phosphatase 80 38 - 126 U/L   Total Bilirubin 0.5 0.3 - 1.2 mg/dL   GFR calc non Af Amer 57 (L) >60 mL/min   GFR calc Af Amer >60 >60 mL/min    Comment: (NOTE) The eGFR has been calculated using the CKD EPI equation. This calculation has not been validated in all clinical situations. eGFR's persistently <60 mL/min signify possible Chronic Kidney Disease.    Anion gap 9 5 - 15  Lipase, blood     Status: Abnormal   Collection Time: 08/09/16  3:30 PM  Result Value Ref Range  Lipase 10 (L) 11 - 51 U/L  CBC WITH DIFFERENTIAL     Status: Abnormal   Collection Time: 08/09/16  3:30 PM  Result Value Ref Range   WBC 17.0 (H) 4.0 - 10.5 K/uL   RBC 3.96 3.87 - 5.11 MIL/uL   Hemoglobin 11.6 (L) 12.0 - 15.0 g/dL   HCT 35.3 (L) 36.0 - 46.0 %   MCV 89.1 78.0 - 100.0 fL   MCH 29.3 26.0 - 34.0 pg   MCHC 32.9 30.0 - 36.0 g/dL   RDW 14.4 11.5 - 15.5 %   Platelets PLATELET CLUMPS NOTED ON SMEAR, UNABLE TO ESTIMATE 150 - 400 K/uL   Neutrophils Relative % 88 %   Lymphocytes Relative 9 %   Monocytes Relative 3 %   Eosinophils Relative 0 %   Basophils Relative 0 %   Neutro Abs 15.0 (H) 1.7 - 7.7 K/uL   Lymphs Abs 1.5 0.7 - 4.0 K/uL   Monocytes Absolute 0.5 0.1 - 1.0 K/uL   Eosinophils Absolute 0.0 0.0 - 0.7 K/uL   Basophils Absolute 0.0 0.0 - 0.1 K/uL  I-Stat Chem 8, ED     Status: Abnormal   Collection Time: 08/09/16  3:39 PM  Result Value Ref Range   Sodium 135 135 - 145 mmol/L   Potassium 3.9 3.5 - 5.1 mmol/L   Chloride 101 101 - 111 mmol/L   BUN 10 6 - 20 mg/dL   Creatinine, Ser 0.80 0.44 - 1.00 mg/dL   Glucose, Bld 192 (H) 65 - 99 mg/dL   Calcium, Ion 1.10 (L) 1.15 - 1.40  mmol/L   TCO2 26 0 - 100 mmol/L   Hemoglobin 11.9 (L) 12.0 - 15.0 g/dL   HCT 35.0 (L) 36.0 - 46.0 %  I-Stat CG4 Lactic Acid, ED     Status: None   Collection Time: 08/09/16  3:40 PM  Result Value Ref Range   Lactic Acid, Venous 1.50 0.5 - 1.9 mmol/L  Urinalysis, Routine w reflex microscopic     Status: Abnormal   Collection Time: 08/09/16  4:22 PM  Result Value Ref Range   Color, Urine YELLOW YELLOW   APPearance HAZY (A) CLEAR   Specific Gravity, Urine 1.018 1.005 - 1.030   pH 5.0 5.0 - 8.0   Glucose, UA NEGATIVE NEGATIVE mg/dL   Hgb urine dipstick NEGATIVE NEGATIVE   Bilirubin Urine NEGATIVE NEGATIVE   Ketones, ur NEGATIVE NEGATIVE mg/dL   Protein, ur NEGATIVE NEGATIVE mg/dL   Nitrite NEGATIVE NEGATIVE   Leukocytes, UA NEGATIVE NEGATIVE  Strep pneumoniae urinary antigen     Status: None   Collection Time: 08/09/16  4:27 PM  Result Value Ref Range   Strep Pneumo Urinary Antigen NEGATIVE NEGATIVE    Comment:        Infection due to S. pneumoniae cannot be absolutely ruled out since the antigen present may be below the detection limit of the test. Performed at [facility], [address] [place], [place] [address]   Blood culture (routine x 2)     Status: None (Preliminary result)   Collection Time: 08/09/16  6:20 PM  Result Value Ref Range   Specimen Description      BLOOD RIGHT FOREARM Performed at [facility], [place] [address], [place], [place] [address]    Special Requests BOTTLES DRAWN AEROBIC AND ANAEROBIC 5CC    Culture PENDING    Report Status PENDING   Glucose, capillary     Status: Abnormal  Collection Time: 08/09/16  8:55 PM  Result Value Ref Range   Glucose-Capillary 179 (H) 65 - 99 mg/dL  Glucose, capillary     Status: None   Collection Time: 08/09/16 11:50 PM  Result Value Ref Range   Glucose-Capillary 79 65 - 99 mg/dL  Glucose, capillary     Status: Abnormal   Collection Time: 08/10/16  4:08 AM  Result Value Ref Range    Glucose-Capillary 119 (H) 65 - 99 mg/dL  CBC with Differential/Platelet     Status: Abnormal   Collection Time: 08/10/16  4:44 AM  Result Value Ref Range   WBC 26.0 (H) 4.0 - 10.5 K/uL   RBC 3.17 (L) 3.87 - 5.11 MIL/uL   Hemoglobin 9.6 (L) 12.0 - 15.0 g/dL   HCT 27.9 (L) 36.0 - 46.0 %   MCV 88.0 78.0 - 100.0 fL   MCH 30.3 26.0 - 34.0 pg   MCHC 34.4 30.0 - 36.0 g/dL   RDW 14.5 11.5 - 15.5 %   Platelets 233 150 - 400 K/uL   Neutrophils Relative % 92 %   Lymphocytes Relative 4 %   Monocytes Relative 4 %   Eosinophils Relative 0 %   Basophils Relative 0 %   Neutro Abs 24.0 (H) 1.7 - 7.7 K/uL   Lymphs Abs 1.0 0.7 - 4.0 K/uL   Monocytes Absolute 1.0 0.1 - 1.0 K/uL   Eosinophils Absolute 0.0 0.0 - 0.7 K/uL   Basophils Absolute 0.0 0.0 - 0.1 K/uL   WBC Morphology TOXIC GRANULATION   Basic metabolic panel     Status: Abnormal   Collection Time: 08/10/16  4:44 AM  Result Value Ref Range   Sodium 137 135 - 145 mmol/L   Potassium 4.2 3.5 - 5.1 mmol/L   Chloride 107 101 - 111 mmol/L   CO2 25 22 - 32 mmol/L   Glucose, Bld 135 (H) 65 - 99 mg/dL   BUN 11 6 - 20 mg/dL   Creatinine, Ser 0.84 0.44 - 1.00 mg/dL   Calcium 7.8 (L) 8.9 - 10.3 mg/dL   GFR calc non Af Amer >60 >60 mL/min   GFR calc Af Amer >60 >60 mL/min    Comment: (NOTE) The eGFR has been calculated using the CKD EPI equation. This calculation has not been validated in all clinical situations. eGFR's persistently <60 mL/min signify possible Chronic Kidney Disease.    Anion gap 5 5 - 15  Glucose, capillary     Status: Abnormal   Collection Time: 08/10/16  7:31 AM  Result Value Ref Range   Glucose-Capillary 122 (H) 65 - 99 mg/dL   Ct Abdomen Pelvis W Contrast  Result Date: 08/09/2016 CLINICAL DATA:  Diffuse abdominal pain and back pain EXAM: CT ABDOMEN AND PELVIS WITH CONTRAST TECHNIQUE: Multidetector CT imaging of the abdomen and pelvis was performed using the standard protocol following bolus administration of intravenous  contrast. CONTRAST:  91m ISOVUE-300 IOPAMIDOL (ISOVUE-300) INJECTION 61% COMPARISON:  CT abdomen pelvis of 06/19/2016 FINDINGS: Lower chest: The tree-in-bud appearance of the lung bases is again noted when compared to the prior CT and most likely represents changes of chronic aspiration or possibly atypical infection such as mycobacterium avium complex. No pleural effusion is seen. Again a large hiatal hernia is present containing approximately 30-40% of the stomach. No obstruction is seen. Hepatobiliary: The liver enhances with no new abnormality and no ductal dilatation is seen. Surgical clips are present from prior cholecystectomy. 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 The body and tail the pancreas is unremarkable. Spleen: Spleen is normal in size. Adrenals/Urinary Tract: The adrenal glands are unchanged. A large cyst again emanates from the upper pole of the right kidney and is unchanged. No hydronephrosis is seen. On delayed images, the pelvocaliceal systems are unremarkable. The ureters are not dilated. The urinary bladder is completely decompressed and cannot be assessed. Stomach/Bowel: The stomach view has below the hemidiaphragm is unremarkable with changes of Whipple procedure again noted. No small bowel distention is seen. There is feces throughout the colon. The terminal ileum is not well seen but no abnormality is noted. The appendix is not visualized. Vascular/Lymphatic: The abdominal aorta is normal in caliber with age consistent abdominal aortic atherosclerosis present. No adenopathy is seen. Reproductive: The uterus has previously been resected. No adnexal lesion is seen. Other: There is some free fluid within the pelvis and a small amount of fluid within the upper abdomen. With no evidence of solid organ injury, this is a nonspecific finding and could be non inflammatory as with hypoproteinemia or possibly due to  ascites, inflammation, or perhaps peritonitis and clinical correlation is recommended. There is no evidence of free air to indicate bowel perforation. Musculoskeletal: The bones are diffusely osteopenic with vertebroplasty at T12 and L2. IMPRESSION: 1. Little change in appearance of the lung bases which could indicate chronic aspiration or possibly due to atypical infection as noted above. 2. No change in moderate to large hiatal hernia. 3. Some free fluid within the upper abdomen and pelvis which is nonspecific as described above. Consider noninflammatory cause such as hypoproteinemia versus inflammation, peritonitis, or ascites. 4. Prior Whipple procedure. Electronically Signed   By: Ivar Drape M.D.   On: 08/09/2016 17:21   Anti-infectives    Start     Dose/Rate Route Frequency Ordered Stop   08/10/16 1000  piperacillin-tazobactam (ZOSYN) IVPB 3.375 g     3.375 g 12.5 mL/hr over 240 Minutes Intravenous Every 8 hours 08/10/16 0817     08/10/16 0900  azithromycin (ZITHROMAX) 500 mg in dextrose 5 % 250 mL IVPB     500 mg 250 mL/hr over 60 Minutes Intravenous Daily 08/10/16 0802     08/09/16 2300  Ampicillin-Sulbactam (UNASYN) 3 g in sodium chloride 0.9 % 100 mL IVPB  Status:  Discontinued     3 g 200 mL/hr over 30 Minutes Intravenous Every 8 hours 08/09/16 2149 08/10/16 0801   08/09/16 1800  piperacillin-tazobactam (ZOSYN) IVPB 3.375 g     3.375 g 100 mL/hr over 30 Minutes Intravenous  Once 08/09/16 1755 08/09/16 1906   08/09/16 1800  vancomycin (VANCOCIN) IVPB 1000 mg/200 mL premix     1,000 mg 200 mL/hr over 60 Minutes Intravenous  Once 08/09/16 1755 08/09/16 2007        Assessment/Plan Abdominal pain, nonspecific free fluid in abdomen - abdominal surgical history includes Whipple for ampulla of vater tumor 2008 by Dr. Doyle Askew at [facility]; hysterectomy  - vague global/epigastric abdominal pain for months, worse x2 weeks, severe x1 day - WBC 17 on arrival, up to 26 today - CT of the abdomen  and pelvis showed changes at the lung bases suggestive of aspiration pneumonia or atypical infection, as well as nonspecific free fluid in the upper abdomen and pelvis possibly 2/2 to hypoproteinemia versus inflammation, peritonitis, or ascites   Aspiration pneumonia - on zosyn and azithromycin Hiatal hernia H/o amulla of vater cancer s/p Whipple 2008 GERD -  scheduled for endoscopy 07/26/16 with Dr. Michail Sermon but missed appointment Depression/anxiety Chronic anemia  ID - zosyn 3/14>>, azithromycin 3/15>> VTE - SCDs FEN - IVF, NPO  Plan - Unsure exactly what is causing Ms. Forquer abdominal pain. Agree with IV antibiotics, bowel rest, and IV PPI. Could consider repeating CT in the next few days. Would also recommend consulting GI as Ms. Ploch was scheduled for upper endoscopy last month for h/o GERD but missed the appointment; this could also be adding into her abdominal pain. Will order CEA Ca19-9.  Jerrye Beavers, [facility] Surgery 08/10/2016, 9:37 AM Pager: [phone] Consults: [phone] Mon-Fri 7:00 am-4:30 pm Sat-Sun 7:00 am-11:30 am

## 2016-08-10 NOTE — Consult Note (Signed)
Referring Provider:  [place] Primary Care Physician:  Irven Shelling, MD Primary Gastroenterologist:  Dr. Michail Sermon   Reason for Consultation:  Abdominal pain.  HPI: Mackenzie Mendoza is a 83 y.o. 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 Joette Catching notes from recent visit 06/2016) admitted to the hospital with worsening abdominal pain. Underwent CT abdomen pelvis with contrast which showed small fever fluid in the upper abdomen and pelvis otherwise no acute changes. Blood work showed significant leukocytosis. Normal LFTs. Normal lipase.  Patient seen and examined. According to patient he has been an on and off abdominal pain since last 6-8 weeks but started having severe abdominal pain yesterday. Described as left upper quadrant and bilateral lower quadrant, sharp without any radiation. Pain is not associated with any vomiting. Denied any diarrhea. Complaining of constipation. Denied any blood in the stool or black stool. Pain is only relieved by pain medication. Pain gets worse with movement. Pain also gets worse with deep breathing. Subjective fever yesterday. Denied dysphagia or odynophagia.     Past Medical History:  Diagnosis Date  . Cancer [facility])     Past Surgical History:  Procedure Laterality Date  . ABDOMINAL HYSTERECTOMY    . CHOLECYSTECTOMY      Prior to Admission medications   Medication Sig Start Date End Date Taking? Authorizing Provider  acetaminophen (TYLENOL) 500 MG tablet Take 1,000 mg by mouth every 6 (six) hours as needed for moderate pain or headache.    Yes Historical Provider, MD  albuterol (PROVENTIL HFA;VENTOLIN HFA) 108 (90 Base) MCG/ACT inhaler Inhale 1-2 puffs into the lungs every 6 (six) hours as needed for wheezing or shortness of breath. 07/28/16  Yes Okey Regal, PA-C   cholecalciferol (VITAMIN D) 1000 units tablet Take 2,000 Units by mouth daily as needed (takes occasionally).    Yes Historical Provider, MD  citalopram (CELEXA) 10 MG tablet Take 10 mg by mouth daily.   Yes Historical Provider, MD  Cyanocobalamin (VITAMIN B 12 PO) Take 2,500 mcg by mouth once a week.    Yes Historical Provider, MD  esomeprazole (NEXIUM) 20 MG capsule Take 20 mg by mouth daily as needed (acid reflux).   Yes Historical Provider, MD  LORazepam (ATIVAN) 1 MG tablet Take 1 mg by mouth See admin instructions. Takes 1 tab daily, and can take 1 addt'l tab as needed for anxiety   Yes Historical Provider, MD    Scheduled Meds: . azithromycin  500 mg Intravenous Daily  . citalopram  10 mg Oral Daily  . insulin aspart  0-9 Units Subcutaneous Q4H  . pantoprazole (PROTONIX) IV  40 mg Intravenous Q12H  . piperacillin-tazobactam (ZOSYN)  IV  3.375 g Intravenous Q8H   Continuous Infusions: . sodium chloride     PRN Meds:.ipratropium-albuterol, LORazepam  Allergies as of 08/09/2016 - Review Complete 08/09/2016  Allergen Reaction Noted  . Aspirin Hives 09/05/2011  . Codeine-promethazine [promethazine-codeine] Nausea And Vomiting 09/05/2011  . Estrogens Hives 09/05/2011  . Phenobarbital Itching 09/05/2011    History reviewed. No pertinent family history.  Social History   Social History  . Marital status: Married    Spouse name: N/A  . Number of children: N/A  . Years of education: N/A   Occupational History  . Not on file.   Social History Main Topics  . Smoking  status: Current Every Day Smoker    Packs/day: 1.00    Types: Cigarettes  . Smokeless tobacco: Never Used  . Alcohol use No  . Drug use: No  . Sexual activity: Not on file   Other Topics Concern  . Not on file   Social History Narrative  . No narrative on file    Review of Systems: All negative except as stated above in HPI. Review of Systems  Constitutional: Positive for chills.  HENT: Negative for  ear discharge and nosebleeds.   Eyes: Negative for pain and discharge.  Respiratory: Positive for cough and shortness of breath.   Cardiovascular: Negative for palpitations and orthopnea.  Gastrointestinal: Positive for abdominal pain and constipation. Negative for blood in stool and diarrhea.  Genitourinary: Negative for dysuria and urgency.  Musculoskeletal: Positive for back pain. Negative for falls.  Skin: Negative for rash.  Neurological: Positive for weakness. Negative for seizures and loss of consciousness.  Endo/Heme/Allergies: Does not bruise/bleed easily.  Psychiatric/Behavioral: Negative for hallucinations and suicidal ideas.   Physical Exam: Vital signs: Vitals:   08/10/16 0548 08/10/16 1400  BP: (!) 96/51 98/60  Pulse: 80 80  Resp: 15 16  Temp: 98.8 F (37.1 C) 98.9 F (37.2 C)   Physical Exam  Constitutional: She is oriented to person, place, and time. She appears distressed.  Elderly appearing patient in distress from pain  HENT:  Head: Normocephalic and atraumatic.  Mouth/Throat: Oropharynx is clear and moist.  Eyes: EOM are normal. No scleral icterus.  Neck: Normal range of motion. Neck supple.  Cardiovascular: Normal rate and regular rhythm.   Murmur heard. Pulmonary/Chest: Effort normal and breath sounds normal. She has no wheezes. She exhibits tenderness.  Tenderness to her left lower ribs.  Abdominal: Soft. Bowel sounds are normal. She exhibits no distension. There is tenderness. There is guarding. There is no rebound.  Severe  tenderness to palpation at left upper quadrant as well as bilateral lower quadrants. Some guarding but no rebound.  Neurological: She is alert and oriented to person, place, and time.  Skin: Skin is warm. No erythema.  Psychiatric: Mood and memory normal.     GI:  Lab Results:  Recent Labs  08/09/16 1530 08/09/16 1539 08/10/16 0444  WBC 17.0*  --  26.0*  HGB 11.6* 11.9* 9.6*  HCT 35.3* 35.0* 27.9*  PLT PLATELET CLUMPS  NOTED ON SMEAR, UNABLE TO ESTIMATE  --  233   BMET  Recent Labs  08/09/16 1530 08/09/16 1539 08/10/16 0444  NA 132* 135 137  K 3.8 3.9 4.2  CL 100* 101 107  CO2 23  --  25  GLUCOSE 188* 192* 135*  BUN _0 CREATININE 0.92 0.80 0.84  CALCIUM 8.6*  --  7.8*   LFT  Recent Labs  08/09/16 1530  PROT 6.7  ALBUMIN 3.2*  AST 18  ALT 13*  ALKPHOS 80  BILITOT 0.5   PT/INR No results for input(s): LABPROT, INR in the last 72 hours.   Studies/Results: Ct Abdomen Pelvis W Contrast  Result Date: 08/09/2016 CLINICAL DATA:  Diffuse abdominal pain and back pain EXAM: CT ABDOMEN AND PELVIS WITH CONTRAST TECHNIQUE: Multidetector CT imaging of the abdomen and pelvis was performed using the standard protocol following bolus administration of intravenous contrast. CONTRAST:  62m ISOVUE-300 IOPAMIDOL (ISOVUE-300) INJECTION 61% COMPARISON:  CT abdomen pelvis of 06/19/2016 FINDINGS: Lower chest: The tree-in-bud appearance of the lung bases is again noted when compared to the prior CT and most  likely represents changes of chronic aspiration or possibly atypical infection such as mycobacterium avium complex. No pleural effusion is seen. Again a large hiatal hernia is present containing approximately 30-40% of the stomach. No obstruction is seen. Hepatobiliary: The liver enhances with no new abnormality and no ductal dilatation is seen. Surgical clips are present from prior cholecystectomy. 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 The body and tail the pancreas is unremarkable. Spleen: Spleen is normal in size. Adrenals/Urinary Tract: The adrenal glands are unchanged. A large cyst again emanates from the upper pole of the right kidney and is unchanged. No hydronephrosis is seen. On delayed images, the pelvocaliceal systems are unremarkable. The ureters are not dilated. The urinary bladder is completely decompressed and cannot be  assessed. Stomach/Bowel: The stomach view has below the hemidiaphragm is unremarkable with changes of Whipple procedure again noted. No small bowel distention is seen. There is feces throughout the colon. The terminal ileum is not well seen but no abnormality is noted. The appendix is not visualized. Vascular/Lymphatic: The abdominal aorta is normal in caliber with age consistent abdominal aortic atherosclerosis present. No adenopathy is seen. Reproductive: The uterus has previously been resected. No adnexal lesion is seen. Other: There is some free fluid within the pelvis and a small amount of fluid within the upper abdomen. With no evidence of solid organ injury, this is a nonspecific finding and could be non inflammatory as with hypoproteinemia or possibly due to ascites, inflammation, or perhaps peritonitis and clinical correlation is recommended. There is no evidence of free air to indicate bowel perforation. Musculoskeletal: The bones are diffusely osteopenic with vertebroplasty at T12 and L2. IMPRESSION: 1. Little change in appearance of the lung bases which could indicate chronic aspiration or possibly due to atypical infection as noted above. 2. No change in moderate to large hiatal hernia. 3. Some free fluid within the upper abdomen and pelvis which is nonspecific as described above. Consider noninflammatory cause such as hypoproteinemia versus inflammation, peritonitis, or ascites. 4. Prior Whipple procedure. Electronically Signed   By: Ivar Drape M.D.   On: 08/09/2016 17:21    Impression/Plan: - Acute abdominal pain with leukocytosis. Unclear etiology.  - History of Whipple's procedure  in 2008 for tumor of ampulla of veter  - Leukocytosis. ?? Aspiration pneumonia versus atypical lung infection based on recent CT - Constipation - Anemia. No overt bleeding. - history of anastomotic ulcer  Recommendations -------------------------- - Patient with severe left upper quadrant as well as  bilateral lower quadrant pain without any nausea and vomiting. CT scan showed normal-appearing small bowel without any distention. - Continue IV twice a day PPI for now. Consider EGD once acute issues are resolved. EGD will not explain patient's bilateral lower quadrant pain. Patient currently has a borderline blood pressure with possible pneumonia and significant leukocytosis.  - Agree with repeating CT scan in  couple days with IV and oral contrast for further evaluation. - Antibiotics per primary team. - Surgery team following. - GI  will follow    LOS: 1 day   Otis Brace  MD, FACP 08/10/2016, 4:25 PM  Pager [phone] If no answer or after 5 PM call [phone]

## 2016-08-11 LAB — CBC
HEMATOCRIT: 26.8 % — AB (ref 36.0–46.0)
HEMOGLOBIN: 8.9 g/dL — AB (ref 12.0–15.0)
MCH: 30.2 pg (ref 26.0–34.0)
MCHC: 33.2 g/dL (ref 30.0–36.0)
MCV: 90.8 fL (ref 78.0–100.0)
PLATELETS: 169 10*3/uL (ref 150–400)
RBC: 2.95 MIL/uL — AB (ref 3.87–5.11)
RDW: 14.9 % (ref 11.5–15.5)
WBC: 16.6 10*3/uL — AB (ref 4.0–10.5)

## 2016-08-11 LAB — BASIC METABOLIC PANEL
ANION GAP: 6 (ref 5–15)
BUN: 12 mg/dL (ref 6–20)
CHLORIDE: 108 mmol/L (ref 101–111)
CO2: 22 mmol/L (ref 22–32)
Calcium: 7.4 mg/dL — ABNORMAL LOW (ref 8.9–10.3)
Creatinine, Ser: 0.88 mg/dL (ref 0.44–1.00)
GFR calc Af Amer: >60 mL/min (ref >60)
GLUCOSE: 65 mg/dL (ref 65–99)
POTASSIUM: 4.1 mmol/L (ref 3.5–5.1)
Sodium: 136 mmol/L (ref 135–145)

## 2016-08-11 LAB — GLUCOSE, CAPILLARY
GLUCOSE-CAPILLARY: 189 mg/dL — AB (ref 65–99)
GLUCOSE-CAPILLARY: 178 mg/dL — AB (ref 65–99)
GLUCOSE-CAPILLARY: 162 mg/dL — AB (ref 65–99)
Glucose-Capillary: 193 mg/dL — ABNORMAL HIGH (ref 65–99)
Glucose-Capillary: 63 mg/dL — ABNORMAL LOW (ref 65–99)
Glucose-Capillary: 79 mg/dL (ref 65–99)

## 2016-08-11 LAB — HEMOGLOBIN A1C
Hgb A1c MFr Bld: 5.5 % (ref 4.8–5.6)
Mean Plasma Glucose: 111 mg/dL

## 2016-08-11 LAB — LACTIC ACID, PLASMA
LACTIC ACID, VENOUS: 0.8 mmol/L (ref 0.5–1.9)
Lactic Acid, Venous: 0.9 mmol/L (ref 0.5–1.9)

## 2016-08-11 LAB — CEA: CEA: 5.1 ng/mL — ABNORMAL HIGH (ref 0.0–4.7)

## 2016-08-11 LAB — CANCER ANTIGEN 19-9: CA 19-9: 10 U/mL (ref 0–35)

## 2016-08-11 MED ORDER — POLYETHYLENE GLYCOL 3350 17 G PO PACK
17.0000 g | PACK | Freq: Every day | ORAL | Status: DC
  Administered 2016-08-11 – 2016-08-12 (×2): 17 g via ORAL
  Filled 2016-08-11: qty 1

## 2016-08-11 MED ORDER — DEXTROSE-NACL 5-0.9 % IV SOLN
INTRAVENOUS | Status: DC
  Administered 2016-08-11 – 2016-08-14 (×7): via INTRAVENOUS

## 2016-08-11 MED ORDER — SODIUM CHLORIDE 0.9 % IV BOLUS (SEPSIS)
500.0000 mL | Freq: Once | INTRAVENOUS | Status: AC
  Administered 2016-08-11: 500 mL via INTRAVENOUS

## 2016-08-11 MED ORDER — BISACODYL 10 MG RE SUPP
10.0000 mg | Freq: Once | RECTAL | Status: AC
  Administered 2016-08-11: 10 mg via RECTAL
  Filled 2016-08-11: qty 1

## 2016-08-11 MED ORDER — DEXTROSE 50 % IV SOLN
1.0000 | Freq: Once | INTRAVENOUS | Status: AC
  Administered 2016-08-11: 50 mL via INTRAVENOUS
  Filled 2016-08-11: qty 50

## 2016-08-11 NOTE — Progress Notes (Signed)
[facility] Progress Note  Subjective: The patient states that her abdominal pain is still present today. Seems to be generalized and more in the mid and lower abdomen. The actual etiology of this pain is unclear but she tells me today that she has not had a bowel movement in 3 weeks. In reviewing her CT scan there is stool throughout the colon.  Objective: Vital signs in last 24 hours: Temp:  [97.9 F (36.6 C)-99.6 F (37.6 C)] 97.9 F (36.6 C) (03/16 0500) Pulse Rate:  [79-85] 85 (03/16 0500) Resp:  [16] 16 (03/16 0500) BP: (83-98)/(34-60) 95/44 (03/16 0500) SpO2:  [94 %-100 %] 94 % (03/16 0500) Weight change:    PE:  No distress  Heart regular rhythm with grade 2/6 systolic murmur  Abdomen, bowel sounds present, soft, tender in the lower abdomen bilaterally but no rebound  Lab Results: Results for orders placed or performed during the hospital encounter of 08/09/16 (from the past 24 hour(s))  Glucose, capillary     Status: Abnormal   Collection Time: 08/10/16 11:54 AM  Result Value Ref Range   Glucose-Capillary 129 (H) 65 - 99 mg/dL  CEA     Status: Abnormal   Collection Time: 08/10/16  1:20 PM  Result Value Ref Range   CEA 5.1 (H) 0.0 - 4.7 ng/mL  Cancer antigen 19-9     Status: None   Collection Time: 08/10/16  1:20 PM  Result Value Ref Range   CA 19-9 10 0 - 35 U/mL  Glucose, capillary     Status: None   Collection Time: 08/10/16  4:18 PM  Result Value Ref Range   Glucose-Capillary 87 65 - 99 mg/dL  Glucose, capillary     Status: None   Collection Time: 08/10/16  8:40 PM  Result Value Ref Range   Glucose-Capillary 83 65 - 99 mg/dL  Glucose, capillary     Status: None   Collection Time: 08/11/16 12:12 AM  Result Value Ref Range   Glucose-Capillary 79 65 - 99 mg/dL  Glucose, capillary     Status: Abnormal   Collection Time: 08/11/16  4:27 AM  Result Value Ref Range   Glucose-Capillary 63 (L) 65 - 99 mg/dL  CBC     Status: Abnormal   Collection  Time: 08/11/16  4:36 AM  Result Value Ref Range   WBC 16.6 (H) 4.0 - 10.5 K/uL   RBC 2.95 (L) 3.87 - 5.11 MIL/uL   Hemoglobin 8.9 (L) 12.0 - 15.0 g/dL   HCT 26.8 (L) 36.0 - 46.0 %   MCV 90.8 78.0 - 100.0 fL   MCH 30.2 26.0 - 34.0 pg   MCHC 33.2 30.0 - 36.0 g/dL   RDW 14.9 11.5 - 15.5 %   Platelets 169 150 - 400 K/uL  Basic metabolic panel     Status: Abnormal   Collection Time: 08/11/16  4:36 AM  Result Value Ref Range   Sodium 136 135 - 145 mmol/L   Potassium 4.1 3.5 - 5.1 mmol/L   Chloride 108 101 - 111 mmol/L   CO2 22 22 - 32 mmol/L   Glucose, Bld 65 65 - 99 mg/dL   BUN 12 6 - 20 mg/dL   Creatinine, Ser 0.88 0.44 - 1.00 mg/dL   Calcium 7.4 (L) 8.9 - 10.3 mg/dL   GFR calc non Af Amer >60 >60 mL/min   GFR calc Af Amer >60 >60 mL/min   Anion gap 6 5 - 15  Glucose, capillary  Status: Abnormal   Collection Time: 08/11/16  6:09 AM  Result Value Ref Range   Glucose-Capillary 189 (H) 65 - 99 mg/dL  Glucose, capillary     Status: Abnormal   Collection Time: 08/11/16  7:42 AM  Result Value Ref Range   Glucose-Capillary 178 (H) 65 - 99 mg/dL    Studies/Results: No results found.    Assessment: Abdominal pain of uncertain etiology  Constipation. I wonder if this could be playing a component in her pain  Plan:   Will add Miralax. Follow clinically.    Cassell Clement 08/11/2016, 10:34 AM  Pager: [phone] If no answer or after 5 PM call [phone]

## 2016-08-11 NOTE — Progress Notes (Signed)
PROGRESS NOTE    Mackenzie Mendoza  ZES:[id number] DOB: [birth year] DOA: 08/09/2016 PCP: Irven Shelling, MD   Brief Narrative: Mackenzie Mendoza is a 83 y.o. 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 Patient is a very poor historian and history is augmented by the report of her son at the bedside. She has reportedly been suffering from vague generalized abdominal discomfort for several months with no alleviating or exacerbating factors identified. Pain is worsened significantly over the past 2 weeks, and particularly over the past couple days. She now describes the pain as severe, sharp, localized to the upper quadrants and epigastrium, with no identifiable alleviating or exacerbating factors, no nausea, and no vomiting or diarrhea. Patient reports frequent chills.  She endorses a nonproductive cough and exertional dyspnea, worsening insidiously over the past week or more.    Assessment & Plan:   Principal Problem:   Aspiration pneumonia ([place]) Active Problems:   Cancer of ampulla of Vater ([place])   Normocytic anemia   Abdominal pain   Depression   Acute respiratory failure with hypoxia (H[facility])   1-Aspiration PNA;  Continue with Zosyn and Azithromycin.  Blood cultures no growth to date.  WBC trending down.  Continue with IV fluids.   2-Abdominal pain, acute on chronic;  CT with non specific free fluid in abdomen, differential peritonitis, vs hypoalbuminemia. Patient with persistent abdominal pain, leukocytosis, fever. Will consult General sx for evaluation. Surgery recommend conservative care, no need for sx intervention.  IV protonix.  GI consulted and following. Considering constipation. Miralax added.   3-Hypotension;   IV bolus.  Check lactic acid.   Anemia; follow trend.  Will check anemia panel.   Continue Celexa and Ativan    Depression;  DVT prophylaxis: SCD. Add lovenox.  Code Status: Full code.  Family Communication: care discussed with patient.  Disposition Plan: remain inpati9ent for IV antibiotics.  Consultants:   General sx.    Procedures: none   Antimicrobials;  Zosyn 3-15  Azithromycin. 3-15     Subjective: Sleepy this morning, wake up and answer questions. Report pain ok at this moment.  No BM. Breathing ok,.    Objective: Vitals:   08/10/16 2131 08/10/16 2212 08/11/16 0500 08/11/16 1100  BP: (!) 83/34 (!) 94/52 (!) 95/44 (!) 88/32  Pulse: 79  85 74  Resp: 16  16 16   Temp: 99.6 F (37.6 C)  97.9 F (36.6 C) 98.9 F (37.2 C)  TempSrc: Oral  Oral Oral  SpO2: 100%  94% 95%  Weight:      Height:        Intake/Output Summary (Last 24 hours) at 08/11/16 1202 Last data filed at 08/11/16 1100  Gross per 24 hour  Intake             2485 ml  Output              500 ml  Net             1985 ml   Filed Weights   08/09/16 2043  Weight: 44.5 kg (98 lb)    Examination:  General exam: sleepy Respiratory system: Clear to auscultation. Respiratory effort normal. Cardiovascular system: S1 & S2 heard, RRR. No JVD, murmurs, rubs, gallops or clicks. No pedal edema. Gastrointestinal system: Abdomen is  nondistended, soft, mild  tender lower quadrant.  No organomegaly or masses felt. Normal bowel sounds heard. Central nervous system: Alert and oriented. No focal neurological deficits. Extremities: Symmetric 5 x 5 power. Skin: No rashes, lesions or ulcers Psychiatry: Judgement and insight appear normal. Mood & affect appropriate.     Data Reviewed: I have personally reviewed following labs and imaging studies  CBC:  Recent Labs Lab 08/09/16 1530 08/09/16 1539 08/10/16 0444 08/11/16 0436  WBC 17.0*  --  26.0* 16.6*  NEUTROABS 15.0*  --  24.0*  --   HGB  11.6* 11.9* 9.6* 8.9*  HCT 35.3* 35.0* 27.9* 26.8*  MCV 89.1  --  88.0 90.8  PLT PLATELET CLUMPS NOTED ON SMEAR, UNABLE TO ESTIMATE  --  233 458   Basic Metabolic Panel:  Recent Labs Lab 08/09/16 1530 08/09/16 1539 08/10/16 0444 08/11/16 0436  NA 132* 135 137 136  K 3.8 3.9 4.2 4.1  CL 100* 101 107 108  CO2 23  --  25 22  GLUCOSE 188* 192* 135* 65  BUN 10 10 11 12   CREATININE 0.92 0.80 0.84 0.88  CALCIUM 8.6*  --  7.8* 7.4*   GFR: Estimated Creatinine Clearance: 35.8 mL/min (by C-G formula based on SCr of 0.88 mg/dL). Liver Function Tests:  Recent Labs Lab 08/09/16 1530  AST 18  ALT 13*  ALKPHOS 80  BILITOT 0.5  PROT 6.7  ALBUMIN 3.2*    Recent Labs Lab 08/09/16 1530  LIPASE 10*   No results for input(s): AMMONIA in the last 168 hours. Coagulation Profile: No results for input(s): INR, PROTIME in the last 168 hours. Cardiac Enzymes: No results for input(s): CKTOTAL, CKMB, CKMBINDEX, TROPONINI in the last 168 hours. BNP (last 3 results) No results for input(s): PROBNP in the last 8760 hours. HbA1C:  Recent Labs  08/09/16 2052  HGBA1C 5.5   CBG:  Recent Labs Lab 08/10/16 2040 08/11/16 0012 08/11/16 0427 08/11/16 0609 08/11/16 0742  GLUCAP 83 79 63* 189* 178*   Lipid Profile: No results for input(s): CHOL, HDL, LDLCALC, TRIG, CHOLHDL, LDLDIRECT in the last 72 hours. Thyroid Function Tests: No results for input(s): TSH, T4TOTAL, FREET4, T3FREE, THYROIDAB in the last 72 hours. Anemia Panel: No results for input(s): VITAMINB12, FOLATE, FERRITIN, TIBC, IRON, RETICCTPCT in the last 72 hours. Sepsis Labs:  Recent Labs Lab 08/09/16 1540  LATICACIDVEN 1.50    Recent Results (from the past 240 hour(s))  Blood culture (routine x 2)     Status: None (Preliminary result)   Collection Time: 08/09/16  3:30 PM  Result Value Ref Range Status   Specimen Description BLOOD LEFT ANTECUBITAL  Final   Special Requests IN PEDIATRIC BOTTLE 1CC  Final    Culture   Final    NO GROWTH < 24 HOURS Performed at [facility], [place] [address] [place], [place] [address]    Report Status PENDING  Incomplete  Urine culture     Status: None   Collection Time: 08/09/16  4:22 PM  Result Value Ref Range Status   Specimen Description URINE, CATHETERIZED  Final   Special Requests NONE  Final   Culture   Final    NO GROWTH Performed at [facility], [address], [place], [place] [address]    Report Status 08/10/2016 FINAL  Final  Blood culture (routine x 2)     Status: None (Preliminary result)   Collection Time: 08/09/16  6:20 PM  Result Value Ref Range Status  Specimen Description BLOOD RIGHT FOREARM  Final   Special Requests BOTTLES DRAWN AEROBIC AND ANAEROBIC 5CC  Final   Culture   Final    NO GROWTH < 24 HOURS Performed at [facility], [place] [address], [place],  [address]    Report Status PENDING  Incomplete         Radiology Studies: Ct Abdomen Pelvis W Contrast  Result Date: 08/09/2016 CLINICAL DATA:  Diffuse abdominal pain and back pain EXAM: CT ABDOMEN AND PELVIS WITH CONTRAST TECHNIQUE: Multidetector CT imaging of the abdomen and pelvis was performed using the standard protocol following bolus administration of intravenous contrast. CONTRAST:  26m ISOVUE-300 IOPAMIDOL (ISOVUE-300) INJECTION 61% COMPARISON:  CT abdomen pelvis of 06/19/2016 FINDINGS: Lower chest: The tree-in-bud appearance of the lung bases is again noted when compared to the prior CT and most likely represents changes of chronic aspiration or possibly atypical infection such as mycobacterium avium complex. No pleural effusion is seen. Again a large hiatal hernia is present containing approximately 30-40% of the stomach. No obstruction is seen. Hepatobiliary: The liver enhances with no new abnormality and no ductal dilatation is seen. Surgical clips are present from prior cholecystectomy. Pancreas: The patient has previously undergone Whipple  procedure and there is absence of the head of the pancreas. The body and tail the pancreas is unremarkable. Spleen: Spleen is normal in size. Adrenals/Urinary Tract: The adrenal glands are unchanged. A large cyst again emanates from the upper pole of the right kidney and is unchanged. No hydronephrosis is seen. On delayed images, the pelvocaliceal systems are unremarkable. The ureters are not dilated. The urinary bladder is completely decompressed and cannot be assessed. Stomach/Bowel: The stomach view has below the hemidiaphragm is unremarkable with changes of Whipple procedure again noted. No small bowel distention is seen. There is feces throughout the colon. The terminal ileum is not well seen but no abnormality is noted. The appendix is not visualized. Vascular/Lymphatic: The abdominal aorta is normal in caliber with age consistent abdominal aortic atherosclerosis present. No adenopathy is seen. Reproductive: The uterus has previously been resected. No adnexal lesion is seen. Other: There is some free fluid within the pelvis and a small amount of fluid within the upper abdomen. With no evidence of solid organ injury, this is a nonspecific finding and could be non inflammatory as with hypoproteinemia or possibly due to ascites, inflammation, or perhaps peritonitis and clinical correlation is recommended. There is no evidence of free air to indicate bowel perforation. Musculoskeletal: The bones are diffusely osteopenic with vertebroplasty at T12 and L2. IMPRESSION: 1. Little change in appearance of the lung bases which could indicate chronic aspiration or possibly due to atypical infection as noted above. 2. No change in moderate to large hiatal hernia. 3. Some free fluid within the upper abdomen and pelvis which is nonspecific as described above. Consider noninflammatory cause such as hypoproteinemia versus inflammation, peritonitis, or ascites. 4. Prior Whipple procedure. Electronically Signed   By: PIvar Drape M.D.   On: 08/09/2016 17:21        Scheduled Meds: . azithromycin  500 mg Intravenous Daily  . citalopram  10 mg Oral Daily  . insulin aspart  0-9 Units Subcutaneous Q4H  . pantoprazole (PROTONIX) IV  40 mg Intravenous Q12H  . piperacillin-tazobactam (ZOSYN)  IV  3.375 g Intravenous Q8H  . polyethylene glycol  17 g Oral Daily  . sodium chloride  500 mL Intravenous Once   Continuous Infusions: . dextrose 5 % and  0.9% NaCl 100 mL/hr at 08/11/16 0503     LOS: 2 days    Time spent: 35 minutes.     Elmarie Shiley, MD [facility] Pager [phone]  If 7PM-7AM, please contact night-coverage [email or web address] Password [facility] 08/11/2016, 12:02 PM

## 2016-08-11 NOTE — Evaluation (Signed)
  SLP Cancellation Note  Patient Details Name: Mackenzie Mendoza MRN: [id number] DOB: 1933-07-27   Cancelled treatment:       Reason Eval/Treat Not Completed: Medical issues which prohibited therapy (pt not due to medical issues, will sign off - please reorder when pt able to have po if desire SLP swallow eval, thanks)   Luanna Salk, [facility] SLP [phone]

## 2016-08-11 NOTE — Progress Notes (Signed)
Notified MD on call Dr. Hal Hope about pt's CBG of 63. Following orders: 1 ampule of D50, change fluids to D5NS, and to recheck CBG one hour after administration. Will continue to monitor.

## 2016-08-11 NOTE — Progress Notes (Signed)
Patient ID: Mackenzie Mendoza, female   DOB: [birth year], 83 y.o.   MRN: [id number]  [facility] Surgery Progress Note     Subjective: No change in abdominal pain. Continues to complain of diffuse abdominal pain. Denies n/v. Passing small amount of flatus. No BM.  Objective: Vital signs in last 24 hours: Temp:  [97.9 F (36.6 C)-99.6 F (37.6 C)] 97.9 F (36.6 C) (03/16 0500) Pulse Rate:  [79-85] 85 (03/16 0500) Resp:  [16] 16 (03/16 0500) BP: (83-98)/(34-60) 95/44 (03/16 0500) SpO2:  [94 %-100 %] 94 % (03/16 0500) Last BM Date:  (PTA)  Intake/Output from previous day: 03/15 0701 - 03/16 0700 In: 2465 [P.O.:120; I.V.:1945; IV Piggyback:400] Out: 750 [Urine:750] Intake/Output this shift: No intake/output data recorded.  PE: Gen:  Alert, NAD, pleasant Card:  RRR, no M/G/R heard Pulm:  CTAB, decreased lung sounds bilateral bases, no W/R/R Abd: Soft, ND, +BS, no HSM, TTP lower abdomen and LUQ/epigastric region. No peritonitis. Ext:  No erythema, edema, or tenderness   Lab Results:   Recent Labs  08/10/16 0444 08/11/16 0436  WBC 26.0* 16.6*  HGB 9.6* 8.9*  HCT 27.9* 26.8*  PLT 233 169   BMET  Recent Labs  08/10/16 0444 08/11/16 0436  NA 137 136  K 4.2 4.1  CL 107 108  CO2 25 22  GLUCOSE 135* 65  BUN 11 12  CREATININE 0.84 0.88  CALCIUM 7.8* 7.4*   PT/INR No results for input(s): LABPROT, INR in the last 72 hours. CMP     Component Value Date/Time   NA 136 08/11/2016 0436   NA 139 08/30/2012 1343   K 4.1 08/11/2016 0436   K 4.3 08/30/2012 1343   CL 108 08/11/2016 0436   CL 103 08/30/2012 1343   CO2 22 08/11/2016 0436   CO2 28 08/30/2012 1343   GLUCOSE 65 08/11/2016 0436   GLUCOSE 74 08/30/2012 1343   BUN 12 08/11/2016 0436   BUN 13.3 08/30/2012 1343   CREATININE 0.88 08/11/2016 0436   CREATININE 0.9 08/30/2012 1343   CALCIUM 7.4 (L) 08/11/2016 0436   CALCIUM 9.2 08/30/2012 1343   PROT 6.7 08/09/2016 1530   PROT 7.3 08/30/2012 1343    ALBUMIN 3.2 (L) 08/09/2016 1530   ALBUMIN 3.1 (L) 08/30/2012 1343   AST 18 08/09/2016 1530   AST 16 08/30/2012 1343   ALT 13 (L) 08/09/2016 1530   ALT 10 08/30/2012 1343   ALKPHOS 80 08/09/2016 1530   ALKPHOS 109 08/30/2012 1343   BILITOT 0.5 08/09/2016 1530   BILITOT 0.29 08/30/2012 1343   GFRNONAA >60 08/11/2016 0436   GFRAA >60 08/11/2016 0436   Lipase     Component Value Date/Time   LIPASE 10 (L) 08/09/2016 1530       Studies/Results: Ct Abdomen Pelvis W Contrast  Result Date: 08/09/2016 CLINICAL DATA:  Diffuse abdominal pain and back pain EXAM: CT ABDOMEN AND PELVIS WITH CONTRAST TECHNIQUE: Multidetector CT imaging of the abdomen and pelvis was performed using the standard protocol following bolus administration of intravenous contrast. CONTRAST:  58m ISOVUE-300 IOPAMIDOL (ISOVUE-300) INJECTION 61% COMPARISON:  CT abdomen pelvis of 06/19/2016 FINDINGS: Lower chest: The tree-in-bud appearance of the lung bases is again noted when compared to the prior CT and most likely represents changes of chronic aspiration or possibly atypical infection such as mycobacterium avium complex. No pleural effusion is seen. Again a large hiatal hernia is present containing approximately 30-40% of the stomach. No obstruction is seen. Hepatobiliary: The liver enhances with  no new abnormality and no ductal dilatation is seen. Surgical clips are present from prior cholecystectomy. 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 The body and tail the pancreas is unremarkable. Spleen: Spleen is normal in size. Adrenals/Urinary Tract: The adrenal glands are unchanged. A large cyst again emanates from the upper pole of the right kidney and is unchanged. No hydronephrosis is seen. On delayed images, the pelvocaliceal systems are unremarkable. The ureters are not dilated. The urinary bladder is completely decompressed and cannot be assessed.  Stomach/Bowel: The stomach view has below the hemidiaphragm is unremarkable with changes of Whipple procedure again noted. No small bowel distention is seen. There is feces throughout the colon. The terminal ileum is not well seen but no abnormality is noted. The appendix is not visualized. Vascular/Lymphatic: The abdominal aorta is normal in caliber with age consistent abdominal aortic atherosclerosis present. No adenopathy is seen. Reproductive: The uterus has previously been resected. No adnexal lesion is seen. Other: There is some free fluid within the pelvis and a small amount of fluid within the upper abdomen. With no evidence of solid organ injury, this is a nonspecific finding and could be non inflammatory as with hypoproteinemia or possibly due to ascites, inflammation, or perhaps peritonitis and clinical correlation is recommended. There is no evidence of free air to indicate bowel perforation. Musculoskeletal: The bones are diffusely osteopenic with vertebroplasty at T12 and L2. IMPRESSION: 1. Little change in appearance of the lung bases which could indicate chronic aspiration or possibly due to atypical infection as noted above. 2. No change in moderate to large hiatal hernia. 3. Some free fluid within the upper abdomen and pelvis which is nonspecific as described above. Consider noninflammatory cause such as hypoproteinemia versus inflammation, peritonitis, or ascites. 4. Prior Whipple procedure. Electronically Signed   By: Ivar Drape M.D.   On: 08/09/2016 17:21    Anti-infectives: Anti-infectives    Start     Dose/Rate Route Frequency Ordered Stop   08/10/16 1000  piperacillin-tazobactam (ZOSYN) IVPB 3.375 g     3.375 g 12.5 mL/hr over 240 Minutes Intravenous Every 8 hours 08/10/16 0817     08/10/16 0900  azithromycin (ZITHROMAX) 500 mg in dextrose 5 % 250 mL IVPB     500 mg 250 mL/hr over 60 Minutes Intravenous Daily 08/10/16 0802     08/09/16 2300  Ampicillin-Sulbactam (UNASYN) 3 g in  sodium chloride 0.9 % 100 mL IVPB  Status:  Discontinued     3 g 200 mL/hr over 30 Minutes Intravenous Every 8 hours 08/09/16 2149 08/10/16 0801   08/09/16 1800  piperacillin-tazobactam (ZOSYN) IVPB 3.375 g     3.375 g 100 mL/hr over 30 Minutes Intravenous  Once 08/09/16 1755 08/09/16 1906   08/09/16 1800  vancomycin (VANCOCIN) IVPB 1000 mg/200 mL premix     1,000 mg 200 mL/hr over 60 Minutes Intravenous  Once 08/09/16 1755 08/09/16 2007       Assessment/Plan Abdominal pain, nonspecific free fluid in abdomen - abdominal surgical history includes Whipple for ampulla of vater tumor 2008 by Dr. Doyle Askew at [facility]; hysterectomy  - vague global/epigastric abdominal pain for months, worse x2 weeks, severe x1 day - WBC 17 on arrival, up to 51 today - CT of the abdomen and pelvis showed changes at the lung bases suggestive of aspiration pneumonia or atypical infection, as well as nonspecific free fluid in the upper abdomen and pelvis possibly 2/2 to hypoproteinemia  versus inflammation, peritonitis, or ascites   Aspiration pneumonia - on zosyn and azithromycin Constipation Hiatal hernia H/o amulla of vater cancer s/p Whipple 2008 H/o large jejunal anastomotic ulcer in 2011 with follow-up EGD in 2012 showing a small jejunal ulcer - scheduled for endoscopy 07/26/16 with Dr. Michail Sermon but missed appointment GERD  Depression/anxiety Chronic anemia  ID - zosyn 3/14>>, azithromycin 3/15>> VTE - SCDs FEN - IVF, NPO  Plan - WBC trending down. Continue with IV antibiotics, bowel rest, and IV PPI. Will continue to monitor. If no improvement may consider repeat CT in the future. GI considering EGD once acute issues resolve.   LOS: 2 days    Jerrye Beavers , [facility] Surgery 08/11/2016, 7:55 AM Pager: [phone] Consults: [phone] Mon-Fri 7:00 am-4:30 pm Sat-Sun 7:00 am-11:30 am

## 2016-08-12 LAB — GLUCOSE, CAPILLARY
GLUCOSE-CAPILLARY: 119 mg/dL — AB (ref 65–99)
GLUCOSE-CAPILLARY: 144 mg/dL — AB (ref 65–99)
GLUCOSE-CAPILLARY: 147 mg/dL — AB (ref 65–99)
Glucose-Capillary: 119 mg/dL — ABNORMAL HIGH (ref 65–99)
Glucose-Capillary: 212 mg/dL — ABNORMAL HIGH (ref 65–99)
Glucose-Capillary: 70 mg/dL (ref 65–99)
Glucose-Capillary: 80 mg/dL (ref 65–99)

## 2016-08-12 LAB — BASIC METABOLIC PANEL
ANION GAP: 5 (ref 5–15)
BUN: 8 mg/dL (ref 6–20)
CALCIUM: 7.4 mg/dL — AB (ref 8.9–10.3)
CO2: 24 mmol/L (ref 22–32)
Chloride: 107 mmol/L (ref 101–111)
Creatinine, Ser: 0.85 mg/dL (ref 0.44–1.00)
GFR calc non Af Amer: >60 mL/min (ref >60)
GLUCOSE: 120 mg/dL — AB (ref 65–99)
POTASSIUM: 3.5 mmol/L (ref 3.5–5.1)
Sodium: 136 mmol/L (ref 135–145)

## 2016-08-12 LAB — CBC
HEMATOCRIT: 25.2 % — AB (ref 36.0–46.0)
Hemoglobin: 8.4 g/dL — ABNORMAL LOW (ref 12.0–15.0)
MCH: 30 pg (ref 26.0–34.0)
MCHC: 33.3 g/dL (ref 30.0–36.0)
MCV: 90 fL (ref 78.0–100.0)
Platelets: 165 10*3/uL (ref 150–400)
RBC: 2.8 MIL/uL — AB (ref 3.87–5.11)
RDW: 14.8 % (ref 11.5–15.5)
WBC: 11.8 10*3/uL — AB (ref 4.0–10.5)

## 2016-08-12 LAB — IRON AND TIBC
IRON: 9 ug/dL — AB (ref 28–170)
Saturation Ratios: 5 % — ABNORMAL LOW (ref 10.4–31.8)
TIBC: 193 ug/dL — AB (ref 250–450)
UIBC: 184 ug/dL

## 2016-08-12 LAB — FOLATE: FOLATE: 11.9 ng/mL (ref >5.9)

## 2016-08-12 LAB — FERRITIN: FERRITIN: 108 ng/mL (ref 11–307)

## 2016-08-12 LAB — VITAMIN B12: VITAMIN B 12: 768 pg/mL (ref 180–914)

## 2016-08-12 LAB — RETICULOCYTES
RBC.: 2.8 MIL/uL — ABNORMAL LOW (ref 3.87–5.11)
RETIC COUNT ABSOLUTE: 16.8 10*3/uL — AB (ref 19.0–186.0)
Retic Ct Pct: 0.6 % (ref 0.4–3.1)

## 2016-08-12 MED ORDER — SODIUM CHLORIDE 0.9 % IV BOLUS (SEPSIS)
500.0000 mL | Freq: Once | INTRAVENOUS | Status: AC
  Administered 2016-08-12: 500 mL via INTRAVENOUS

## 2016-08-12 MED ORDER — SODIUM CHLORIDE 0.9 % IV BOLUS (SEPSIS)
500.0000 mL | Freq: Once | INTRAVENOUS | Status: AC
  Administered 2016-08-12: 10:00:00 500 mL via INTRAVENOUS

## 2016-08-12 MED ORDER — MILK AND MOLASSES ENEMA
1.0000 | Freq: Once | RECTAL | Status: AC
  Administered 2016-08-12: 250 mL via RECTAL
  Filled 2016-08-12: qty 250

## 2016-08-12 MED ORDER — POLYETHYLENE GLYCOL 3350 17 G PO PACK: 17.0000 g | PACK | Freq: Two times a day (BID) | ORAL | Status: DC

## 2016-08-12 NOTE — Progress Notes (Signed)
PROGRESS NOTE    Mackenzie Mendoza  OFB:[id number] DOB: March 19, 1934 DOA: 08/09/2016 PCP: Irven Shelling, MD   Brief Narrative: Mackenzie Mendoza is a 83 y.o. 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 Patient is a very poor historian and history is augmented by the report of her son at the bedside. She has reportedly been suffering from vague generalized abdominal discomfort for several months with no alleviating or exacerbating factors identified. Pain is worsened significantly over the past 2 weeks, and particularly over the past couple days. She now describes the pain as severe, sharp, localized to the upper quadrants and epigastrium, with no identifiable alleviating or exacerbating factors, no nausea, and no vomiting or diarrhea. Patient reports frequent chills.  She endorses a nonproductive cough and exertional dyspnea, worsening insidiously over the past week or more.    Assessment & Plan:   Principal Problem:   Aspiration pneumonia ([place]) Active Problems:   Cancer of ampulla of Vater ([place])   Normocytic anemia   Abdominal pain   Depression   Acute respiratory failure with hypoxia (HCC)   1-Aspiration PNA;  Continue with Zosyn, discontinue azithromycin  Blood cultures no growth to date.  WBC trending down.  Continue with IV fluids.   2-Abdominal pain, acute on chronic;  CT with non specific free fluid in abdomen, differential peritonitis, vs hypoalbuminemia. Patient with persistent abdominal pain, leukocytosis, fever. Will consult General sx for evaluation. Surgery recommend conservative care, no need for sx intervention.  IV protonix.  GI consulted and following. Considering constipation. Miralax added.  Started  on clear   3-Hypotension;  Lactic acid normal.  IV fluids.   Anemia; follow trend.   anemia panel. Iron deficiency.  Will need iron when constipation resolved.   Continue Celexa and Ativan    Depression;  DVT prophylaxis: SCD. Add lovenox.  Code Status: Full code.  Family Communication: care discussed with patient. She dint want that I call her family .  Disposition Plan: remain inpati9ent for IV antibiotics.  Consultants:   General sx.    Procedures: none   Antimicrobials;  Zosyn 3-15  Azithromycin. 3-15     Subjective: Feeling same as yesterday,. Breathing better. Still with abdominal pain, some what better.  Passing gas, no BM yet.     Objective: Vitals:   08/11/16 1100 08/11/16 1333 08/11/16 2139 08/12/16 0441  BP: (!) 88/32 (!) 100/42 (!) 94/42 (!) 91/51  Pulse: 74  78 84  Resp: 16  16 16   Temp: 98.9 F (37.2 C)  98.8 F (37.1 C) (!) 100.5 F (38.1 C)  TempSrc: Oral  Oral Oral  SpO2: 95% 92% 93% 92%  Weight:      Height:        Intake/Output Summary (Last 24 hours) at 08/12/16 1456 Last data filed at 08/12/16 1247  Gross per 24 hour  Intake             1355 ml  Output              900 ml  Net              455 ml   Filed Weights   08/09/16 2043  Weight: 44.5 kg (98 lb)    Examination:  General exam: sleepy Respiratory system: Clear to auscultation.  Respiratory effort normal. Cardiovascular system: S1 & S2 heard, RRR. No JVD, murmurs, rubs, gallops or clicks. No pedal edema. Gastrointestinal system: Abdomen is nondistended, soft, mild  tender lower quadrant.  No organomegaly or masses felt. Normal bowel sounds heard. Central nervous system: Alert and oriented. No focal neurological deficits. Extremities: Symmetric 5 x 5 power. Skin: No rashes, lesions or ulcers Psychiatry: Judgement and insight appear normal. Mood & affect appropriate.     Data Reviewed: I have personally reviewed following labs and imaging studies  CBC:  Recent  Labs Lab 08/09/16 1530 08/09/16 1539 08/10/16 0444 08/11/16 0436 08/12/16 0423  WBC 17.0*  --  26.0* 16.6* 11.8*  NEUTROABS 15.0*  --  24.0*  --   --   HGB 11.6* 11.9* 9.6* 8.9* 8.4*  HCT 35.3* 35.0* 27.9* 26.8* 25.2*  MCV 89.1  --  88.0 90.8 90.0  PLT PLATELET CLUMPS NOTED ON SMEAR, UNABLE TO ESTIMATE  --  233 169 637   Basic Metabolic Panel:  Recent Labs Lab 08/09/16 1530 08/09/16 1539 08/10/16 0444 08/11/16 0436 08/12/16 0423  NA 132* 135 137 136 136  K 3.8 3.9 4.2 4.1 3.5  CL 100* 101 107 108 107  CO2 23  --  25 22 24   GLUCOSE 188* 192* 135* 65 120*  BUN 10 10 11 12 8   CREATININE 0.92 0.80 0.84 0.88 0.85  CALCIUM 8.6*  --  7.8* 7.4* 7.4*   GFR: Estimated Creatinine Clearance: 37.1 mL/min (by C-G formula based on SCr of 0.85 mg/dL). Liver Function Tests:  Recent Labs Lab 08/09/16 1530  AST 18  ALT 13*  ALKPHOS 80  BILITOT 0.5  PROT 6.7  ALBUMIN 3.2*    Recent Labs Lab 08/09/16 1530  LIPASE 10*   No results for input(s): AMMONIA in the last 168 hours. Coagulation Profile: No results for input(s): INR, PROTIME in the last 168 hours. Cardiac Enzymes: No results for input(s): CKTOTAL, CKMB, CKMBINDEX, TROPONINI in the last 168 hours. BNP (last 3 results) No results for input(s): PROBNP in the last 8760 hours. HbA1C:  Recent Labs  08/09/16 2052  HGBA1C 5.5   CBG:  Recent Labs Lab 08/11/16 2030 08/12/16 0024 08/12/16 0439 08/12/16 0802 08/12/16 1128  GLUCAP 193* 70 119* 144* 147*   Lipid Profile: No results for input(s): CHOL, HDL, LDLCALC, TRIG, CHOLHDL, LDLDIRECT in the last 72 hours. Thyroid Function Tests: No results for input(s): TSH, T4TOTAL, FREET4, T3FREE, THYROIDAB in the last 72 hours. Anemia Panel:  Recent Labs  08/12/16 0423  VITAMINB12 768  FOLATE 11.9  FERRITIN 108  TIBC 193*  IRON 9*  RETICCTPCT 0.6   Sepsis Labs:  Recent Labs Lab 08/09/16 1540 08/11/16 1229 08/11/16 1554  LATICACIDVEN 1.50 0.8 0.9     Recent Results (from the past 240 hour(s))  Blood culture (routine x 2)     Status: None (Preliminary result)   Collection Time: 08/09/16  3:30 PM  Result Value Ref Range Status   Specimen Description BLOOD LEFT ANTECUBITAL  Final   Special Requests IN PEDIATRIC BOTTLE 1CC  Final   Culture   Final    NO GROWTH 3 DAYS Performed at [facility], [place] [address] [place], [place] [address]    Report Status PENDING  Incomplete  Urine culture     Status: None   Collection Time: 08/09/16  4:22 PM  Result Value Ref Range Status   Specimen Description URINE, CATHETERIZED  Final   Special Requests NONE  Final  Culture   Final    NO GROWTH Performed at [facility], [place] [address] [place], [place] [address]    Report Status 08/10/2016 FINAL  Final  Blood culture (routine x 2)     Status: None (Preliminary result)   Collection Time: 08/09/16  6:20 PM  Result Value Ref Range Status   Specimen Description BLOOD RIGHT FOREARM  Final   Special Requests BOTTLES DRAWN AEROBIC AND ANAEROBIC 5CC  Final   Culture   Final    NO GROWTH 3 DAYS Performed at [facility], [place] [address], [place], [place] [address]    Report Status PENDING  Incomplete         Radiology Studies: No results found.      Scheduled Meds: . azithromycin  500 mg Intravenous Daily  . citalopram  10 mg Oral Daily  . insulin aspart  0-9 Units Subcutaneous Q4H  . milk and molasses  1 enema Rectal Once  . pantoprazole (PROTONIX) IV  40 mg Intravenous Q12H  . piperacillin-tazobactam (ZOSYN)  IV  3.375 g Intravenous Q8H  . polyethylene glycol  17 g Oral BID   Continuous Infusions: . dextrose 5 % and 0.9% NaCl 100 mL/hr at 08/12/16 0032     LOS: 3 days    Time spent: 35 minutes.     Elmarie Shiley, MD [facility] Pager [phone]  If 7PM-7AM, please contact night-coverage [email or web address] Password [facility] 08/12/2016, 2:56 PM

## 2016-08-12 NOTE — Progress Notes (Signed)
[name] Gastroenterology Progress Note  Mackenzie Mendoza 83 y.o. 1933-06-13  CC:  Abdominal pain   Subjective: Patient continues to have pain but is improved since admission. No bowel movement despite of receiving MiraLAX. Passing gas. Denied nausea or vomiting. She continues to have borderline blood pressure. Had a fever 100.5 this morning  ROS : Positive for abdominal pain and constipation   Objective: Vital signs in last 24 hours: Vitals:   08/11/16 2139 08/12/16 0441  BP: (!) 94/42 (!) 91/51  Pulse: 78 84  Resp: 16 16  Temp: 98.8 F (37.1 C) (!) 100.5 F (38.1 C)    Physical Exam:  Constitutional: She is oriented to person, place, and time. Not connected distress HENT:  Head: Normocephalic and atraumatic.  Mouth/Throat: Oropharynx is clear and moist.  Eyes: EOM are normal. No scleral icterus.  Neck: Normal range of motion. Neck supple.  Cardiovascular: Normal rate and regular rhythm.  Murmur heard. Pulmonary/Chest: Effort normal and breath sounds normal. She has no wheezes. She exhibits tenderness.  Tenderness to her left lower ribs.  Abdominal: Soft. Bowel sounds are normal. Mild abdominal distention with bilateral lower quadrant tenderness on palpation  Psychiatric: Mood and memory normal.  Lab Results:  Recent Labs  08/11/16 0436 08/12/16 0423  NA 136 136  K 4.1 3.5  CL 108 107  CO2 22 24  GLUCOSE 65 120*  BUN 12 8  CREATININE 0.88 0.85  CALCIUM 7.4* 7.4*    Recent Labs  08/09/16 1530  AST 18  ALT 13*  ALKPHOS 80  BILITOT 0.5  PROT 6.7  ALBUMIN 3.2*    Recent Labs  08/09/16 1530  08/10/16 0444 08/11/16 0436 08/12/16 0423  WBC 17.0*  --  26.0* 16.6* 11.8*  NEUTROABS 15.0*  --  24.0*  --   --   HGB 11.6*  < > 9.6* 8.9* 8.4*  HCT 35.3*  < > 27.9* 26.8* 25.2*  MCV 89.1  --  88.0 90.8 90.0  PLT PLATELET CLUMPS NOTED ON SMEAR, UNABLE TO ESTIMATE  --  233 169 165  < > = values in this interval not displayed. No results for input(s):  LABPROT, INR in the last 72 hours.    Assessment/Plan: - Abdominal pain .  Unclear etiology.  ?? From constipation. Patient had no bowel movement in last 2-3 weeks - History of Whipple's procedure  in 2008 for tumor of ampulla of veter  - Leukocytosis. ?? Aspiration pneumonia versus atypical lung infection based on recent CT. Improving - Constipation - Anemia. No overt bleeding. - history of anastomotic ulcer  Recommendations -------------------------- - Recommend clear liquid diet.  - Continue  MiraLAX. Trial of milk and molasses enema.  - If no improvement, may consider slowly giving GoLYTELY /colon prep  - GI  will follow    Otis Brace MD, FACP 08/12/2016, 11:37 AM  Pager [phone]  If no answer or after 5 PM call [phone]

## 2016-08-12 NOTE — Progress Notes (Signed)
  Subjective: Still with lower abdominal pain, though less. Denies nausea Has passed some flatus  Objective: Vital signs in last 24 hours: Temp:  [98.8 F (37.1 C)-100.5 F (38.1 C)] 100.5 F (38.1 C) (03/17 0441) Pulse Rate:  [74-84] 84 (03/17 0441) Resp:  [16] 16 (03/17 0441) BP: (88-100)/(32-51) 91/51 (03/17 0441) SpO2:  [92 %-95 %] 92 % (03/17 0441) Last BM Date:  (Patient stated "2-3 weeks ago")  Intake/Output from previous day: 03/16 0701 - 03/17 0700 In: 20 [P.O.:20] Out: 600 [Urine:600] Intake/Output this shift: No intake/output data recorded.  Exam: Awake and alert Abdomen flat but still with moderate tenderness and guarding in the lower abdomen bilaterally  Lab Results:   Recent Labs  08/11/16 0436 08/12/16 0423  WBC 16.6* 11.8*  HGB 8.9* 8.4*  HCT 26.8* 25.2*  PLT 169 165   BMET  Recent Labs  08/11/16 0436 08/12/16 0423  NA 136 136  K 4.1 3.5  CL 108 107  CO2 22 24  GLUCOSE 65 120*  BUN 12 8  CREATININE 0.88 0.85  CALCIUM 7.4* 7.4*   PT/INR No results for input(s): LABPROT, INR in the last 72 hours. ABG No results for input(s): PHART, HCO3 in the last 72 hours.  Invalid input(s): PCO2, PO2  Studies/Results: No results found.  Anti-infectives: Anti-infectives    Start     Dose/Rate Route Frequency Ordered Stop   08/10/16 1000  piperacillin-tazobactam (ZOSYN) IVPB 3.375 g     3.375 g 12.5 mL/hr over 240 Minutes Intravenous Every 8 hours 08/10/16 0817     08/10/16 0900  azithromycin (ZITHROMAX) 500 mg in dextrose 5 % 250 mL IVPB     500 mg 250 mL/hr over 60 Minutes Intravenous Daily 08/10/16 0802     08/09/16 2300  Ampicillin-Sulbactam (UNASYN) 3 g in sodium chloride 0.9 % 100 mL IVPB  Status:  Discontinued     3 g 200 mL/hr over 30 Minutes Intravenous Every 8 hours 08/09/16 2149 08/10/16 0801   08/09/16 1800  piperacillin-tazobactam (ZOSYN) IVPB 3.375 g     3.375 g 100 mL/hr over 30 Minutes Intravenous  Once 08/09/16 1755  08/09/16 1906   08/09/16 1800  vancomycin (VANCOCIN) IVPB 1000 mg/200 mL premix     1,000 mg 200 mL/hr over 60 Minutes Intravenous  Once 08/09/16 1755 08/09/16 2007      Assessment/Plan: Abdominal pain of uncertain etiology  WBC continues to improve.  Agree with GI that constipation may be playing a roll. I think it is ok to consider sips of clear liquids  LOS: 3 days    Cadden Elizondo A 08/12/2016

## 2016-08-13 ENCOUNTER — Encounter (HOSPITAL_COMMUNITY): Payer: Self-pay | Admitting: Surgery

## 2016-08-13 LAB — BASIC METABOLIC PANEL
ANION GAP: 4 — AB (ref 5–15)
BUN: <5 mg/dL — ABNORMAL LOW (ref 6–20)
CALCIUM: 7.5 mg/dL — AB (ref 8.9–10.3)
CO2: 24 mmol/L (ref 22–32)
Chloride: 109 mmol/L (ref 101–111)
Creatinine, Ser: 0.66 mg/dL (ref 0.44–1.00)
GLUCOSE: 145 mg/dL — AB (ref 65–99)
POTASSIUM: 3.2 mmol/L — AB (ref 3.5–5.1)
Sodium: 137 mmol/L (ref 135–145)

## 2016-08-13 LAB — GLUCOSE, CAPILLARY
GLUCOSE-CAPILLARY: 128 mg/dL — AB (ref 65–99)
GLUCOSE-CAPILLARY: 119 mg/dL — AB (ref 65–99)
Glucose-Capillary: 184 mg/dL — ABNORMAL HIGH (ref 65–99)
Glucose-Capillary: 50 mg/dL — ABNORMAL LOW (ref 65–99)
Glucose-Capillary: 81 mg/dL (ref 65–99)
Glucose-Capillary: 161 mg/dL — ABNORMAL HIGH (ref 65–99)

## 2016-08-13 LAB — CBC
HCT: 25.5 % — ABNORMAL LOW (ref 36.0–46.0)
Hemoglobin: 8.7 g/dL — ABNORMAL LOW (ref 12.0–15.0)
MCH: 30.4 pg (ref 26.0–34.0)
MCHC: 34.1 g/dL (ref 30.0–36.0)
MCV: 89.2 fL (ref 78.0–100.0)
PLATELETS: 156 10*3/uL (ref 150–400)
RBC: 2.86 MIL/uL — AB (ref 3.87–5.11)
RDW: 14.3 % (ref 11.5–15.5)
WBC: 8 10*3/uL (ref 4.0–10.5)

## 2016-08-13 MED ORDER — ENSURE ENLIVE PO LIQD
237.0000 mL | Freq: Two times a day (BID) | ORAL | Status: DC
  Administered 2016-08-14: 237 mL via ORAL

## 2016-08-13 MED ORDER — MAGIC MOUTHWASH: 15.0000 mL | Freq: Four times a day (QID) | ORAL | Status: DC | PRN

## 2016-08-13 MED ORDER — PANTOPRAZOLE SODIUM 40 MG PO TBEC
40.0000 mg | DELAYED_RELEASE_TABLET | Freq: Two times a day (BID) | ORAL | Status: DC
  Administered 2016-08-13 – 2016-08-14 (×3): 40 mg via ORAL
  Filled 2016-08-13 (×3): qty 1

## 2016-08-13 MED ORDER — PHENOL 1.4 % MT LIQD: 2.0000 | OROMUCOSAL | Status: DC | PRN

## 2016-08-13 MED ORDER — POLYETHYLENE GLYCOL 3350 17 G PO PACK: 17.0000 g | PACK | Freq: Every day | ORAL | Status: DC

## 2016-08-13 MED ORDER — TAB-A-VITE/IRON PO TABS
1.0000 | ORAL_TABLET | Freq: Every day | ORAL | Status: DC
  Administered 2016-08-13 – 2016-08-14 (×2): 1 via ORAL
  Filled 2016-08-13 (×2): qty 1

## 2016-08-13 MED ORDER — POLYETHYLENE GLYCOL 3350 17 G PO PACK
17.0000 g | PACK | Freq: Two times a day (BID) | ORAL | Status: DC
  Administered 2016-08-13 (×2): 17 g via ORAL
  Filled 2016-08-13 (×2): qty 1

## 2016-08-13 MED ORDER — SACCHAROMYCES BOULARDII 250 MG PO CAPS
250.0000 mg | ORAL_CAPSULE | Freq: Two times a day (BID) | ORAL | Status: DC
  Administered 2016-08-13 – 2016-08-14 (×3): 250 mg via ORAL
  Filled 2016-08-13 (×3): qty 1

## 2016-08-13 MED ORDER — ENOXAPARIN SODIUM 40 MG/0.4ML ~~LOC~~ SOLN
40.0000 mg | SUBCUTANEOUS | Status: DC
  Administered 2016-08-13: 40 mg via SUBCUTANEOUS
  Filled 2016-08-13: qty 0.4

## 2016-08-13 MED ORDER — MENTHOL 3 MG MT LOZG: 1.0000 | LOZENGE | OROMUCOSAL | Status: DC | PRN

## 2016-08-13 MED ORDER — POTASSIUM CHLORIDE CRYS ER 20 MEQ PO TBCR
40.0000 meq | EXTENDED_RELEASE_TABLET | Freq: Once | ORAL | Status: AC
  Administered 2016-08-13: 14:00:00 40 meq via ORAL
  Filled 2016-08-13: qty 2

## 2016-08-13 MED ORDER — LIP MEDEX EX OINT
1.0000 "application " | TOPICAL_OINTMENT | Freq: Two times a day (BID) | CUTANEOUS | Status: DC
  Administered 2016-08-13 – 2016-08-14 (×3): 1 via TOPICAL
  Filled 2016-08-13: qty 7

## 2016-08-13 MED ORDER — LACTATED RINGERS IV BOLUS (SEPSIS): 1000.0000 mL | Freq: Three times a day (TID) | INTRAVENOUS | Status: DC | PRN

## 2016-08-13 NOTE — Progress Notes (Signed)
PROGRESS NOTE    Mackenzie Mendoza  XKG:[id number] DOB: 05/08/1934 DOA: 08/09/2016 PCP: Irven Shelling, MD   Brief Narrative: Mackenzie Mendoza is a 83 y.o. 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 Patient is a very poor historian and history is augmented by the report of her son at the bedside. She has reportedly been suffering from vague generalized abdominal discomfort for several months with no alleviating or exacerbating factors identified. Pain is worsened significantly over the past 2 weeks, and particularly over the past couple days. She now describes the pain as severe, sharp, localized to the upper quadrants and epigastrium, with no identifiable alleviating or exacerbating factors, no nausea, and no vomiting or diarrhea. Patient reports frequent chills.  She endorses a nonproductive cough and exertional dyspnea, worsening insidiously over the past week or more.    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 Blood cultures no growth to date.  WBC trending down.  Continue with IV fluids.   2-Abdominal pain, Acute on Chronic;  CT with non specific free fluid in abdomen, differential peritonitis, vs hypoalbuminemia. Patient with persistent abdominal pain, leukocytosis, fever. Will consult General sx for evaluation. Surgery recommend conservative care, no need for sx intervention.  IV protonix.  GI consulted and following.  Continue with Miralax.  Pain  improved, might be related to constipation. Advanced diet today.   3-Hypotension;  Lactic acid normal.  IV fluids.   Anemia; follow trend.   anemia panel. Iron deficiency.  Will need iron when constipation resolved.   Continue Celexa and Ativan    Depression;  DVT prophylaxis: SCD. Add lovenox.  Code Status: Full code.  Family Communication: care discussed with patient.  Disposition Plan: remain inpatient for IV antibiotics. PT evaluation.   Consultants:   General sx.    Procedures: none   Antimicrobials;  Zosyn 3-15  Azithromycin. 3-15     Subjective: Report improvement , resolution of abdominal pain.  Had multiples BM yesterday.     Objective: Vitals:   08/12/16 0441 08/12/16 1455 08/12/16 2254 08/13/16 0645  BP: (!) 91/51 (!) 87/51 (!) 93/51 (!) 89/54  Pulse: 84 69 71 72  Resp: 16 15 15 15   Temp: (!) 100.5 F (38.1 C) 98.8 F (37.1 C) 99 F (37.2 C) 98.9 F (37.2 C)  TempSrc: Oral Oral Oral Oral  SpO2: 92% 93% 95% 96%  Weight:      Height:        Intake/Output Summary (Last 24 hours) at 08/13/16 1227 Last data filed at 08/13/16 1020  Gross per 24 hour  Intake          3861.16 ml  Output              703 ml  Net          3158.16 ml   Filed Weights   08/09/16 2043  Weight: 44.5 kg (98 lb)    Examination:  General exam: sleepy Respiratory system: Clear to auscultation. Respiratory effort normal. Cardiovascular system: S1 & S2  heard, RRR. No JVD, murmurs, rubs, gallops or clicks. No pedal edema. Gastrointestinal system: Abdomen is nondistended, soft, mild  tender lower quadrant.  No organomegaly or masses felt. Normal bowel sounds heard. Central nervous system: Alert and oriented. No focal neurological deficits. Extremities: Symmetric 5 x 5 power. Skin: No rashes, lesions or ulcers Psychiatry: Judgement and insight appear normal. Mood & affect appropriate.     Data Reviewed: I have personally reviewed following labs and imaging  studies  CBC:  Recent Labs Lab 08/09/16 1530 08/09/16 1539 08/10/16 0444 08/11/16 0436 08/12/16 0423 08/13/16 0744  WBC 17.0*  --  26.0* 16.6* 11.8* 8.0  NEUTROABS 15.0*  --  24.0*  --   --   --   HGB 11.6* 11.9* 9.6* 8.9* 8.4* 8.7*  HCT 35.3* 35.0* 27.9* 26.8* 25.2* 25.5*  MCV 89.1  --  88.0 90.8 90.0 89.2  PLT PLATELET CLUMPS NOTED ON SMEAR, UNABLE TO ESTIMATE  --  233 169 165 240   Basic Metabolic Panel:  Recent Labs Lab 08/09/16 1530 08/09/16 1539 08/10/16 0444 08/11/16 0436 08/12/16 0423 08/13/16 0744  NA 132* 135 137 136 136 137  K 3.8 3.9 4.2 4.1 3.5 3.2*  CL 100* 101 107 108 107 109  CO2 23  --  25 22 24 24   GLUCOSE 188* 192* 135* 65 120* 145*  BUN 10 10 11 12 8  <5*  CREATININE 0.92 0.80 0.84 0.88 0.85 0.66  CALCIUM 8.6*  --  7.8* 7.4* 7.4* 7.5*   GFR: Estimated Creatinine Clearance: 39.4 mL/min (by C-G formula based on SCr of 0.66 mg/dL). Liver Function Tests:  Recent Labs Lab 08/09/16 1530  AST 18  ALT 13*  ALKPHOS 80  BILITOT 0.5  PROT 6.7  ALBUMIN 3.2*    Recent Labs Lab 08/09/16 1530  LIPASE 10*   No results for input(s): AMMONIA in the last 168 hours. Coagulation Profile: No results for input(s): INR, PROTIME in the last 168 hours. Cardiac Enzymes: No results for input(s): CKTOTAL, CKMB, CKMBINDEX, TROPONINI in the last 168 hours. BNP (last 3 results) No results for input(s): PROBNP in the last 8760 hours. HbA1C: No results for input(s): HGBA1C in the last 72 hours. CBG:  Recent Labs Lab 08/12/16 2357 08/13/16 0444 08/13/16 0843 08/13/16 1156 08/13/16 1220  GLUCAP 119* 128* 184* 50* 81   Lipid Profile: No results for input(s): CHOL, HDL, LDLCALC, TRIG, CHOLHDL, LDLDIRECT in the last 72 hours. Thyroid Function Tests: No results for input(s): TSH, T4TOTAL, FREET4, T3FREE, THYROIDAB in the last 72 hours. Anemia Panel:  Recent Labs  08/12/16 0423  VITAMINB12 768  FOLATE 11.9  FERRITIN 108  TIBC 193*  IRON 9*    RETICCTPCT 0.6   Sepsis Labs:  Recent Labs Lab 08/09/16 1540 08/11/16 1229 08/11/16 1554  LATICACIDVEN 1.50 0.8 0.9    Recent Results (from the past 240 hour(s))  Blood culture (routine x 2)     Status: None (Preliminary result)   Collection Time: 08/09/16  3:30 PM  Result Value Ref Range Status   Specimen Description BLOOD LEFT ANTECUBITAL  Final   Special Requests IN PEDIATRIC BOTTLE 1CC  Final   Culture   Final    NO GROWTH 3 DAYS Performed at [facility], [place] [address], [place], [place] [address]    Report Status PENDING  Incomplete  Urine culture     Status: None   Collection Time: 08/09/16  4:22 PM  Result Value Ref Range Status   Specimen Description  URINE, CATHETERIZED  Final   Special Requests NONE  Final   Culture   Final    NO GROWTH Performed at [facility], [place] [address] [place], [place] [address]    Report Status 08/10/2016 FINAL  Final  Blood culture (routine x 2)     Status: None (Preliminary result)   Collection Time: 08/09/16  6:20 PM  Result Value Ref Range Status   Specimen Description BLOOD RIGHT FOREARM  Final   Special Requests BOTTLES DRAWN AEROBIC AND ANAEROBIC 5CC  Final   Culture   Final    NO GROWTH 3 DAYS Performed at [facility], [place] [address], [place], [place] [address]    Report Status PENDING  Incomplete         Radiology Studies: No results found.      Scheduled Meds: . citalopram  10 mg Oral Daily  . feeding supplement (ENSURE ENLIVE)  237 mL Oral BID BM  . insulin aspart  0-9 Units Subcutaneous Q4H  . lip balm  1 application Topical BID  . multivitamins with iron  1 tablet Oral Daily  . pantoprazole  40 mg Oral BID AC  . piperacillin-tazobactam (ZOSYN)  IV  3.375 g Intravenous Q8H  . polyethylene glycol  17 g Oral BID  . potassium chloride  40 mEq Oral Once  . saccharomyces boulardii  250 mg Oral BID   Continuous Infusions: . dextrose 5 % and 0.9% NaCl 100 mL/hr at 08/13/16 1221      LOS: 4 days    Time spent: 35 minutes.     Elmarie Shiley, MD [facility] Pager [phone]  If 7PM-7AM, please contact night-coverage [email or web address] Password [facility] 08/13/2016, 12:27 PM

## 2016-08-13 NOTE — Progress Notes (Signed)
[facility]  [address] [place], [place] [address] Phone: [phone] FAX: Teller [phone] 13-Sep-1933    Problem List:   Principal Problem:   Aspiration pneumonia [facility]) Active Problems:   Cancer of ampulla of Vater ([place])   Normocytic anemia   Abdominal pain   Depression   Acute respiratory failure with hypoxia (HCC)           Assessment  Abd pain resolving  No evidence of abd catastrophe  Plan:  -bowel regimen -adv diet -PPI with h/o ulcers -VTE prophylaxis- SCDs, etc -mobilize as tolerated to help recovery - consider PT/OT evals for disposition  The patient is stable.  There is no evidence of peritonitis, acute abdomen, nor shock.  There is no strong evidence of failure of improvement nor decline with current non-operative management.  There is no need for surgery at the present moment.  We will continue to follow more marginally.   Adin Hector, M.D., F.A.C.S. Gastrointestinal and Minimally Invasive Surgery [facility] [address], [place], [place] [address] [phone] Main / Paging   08/13/2016  CARE TEAM:  PCP: Irven Shelling, MD  Outpatient Care Team: Patient Care Team: Lavone Orn, MD as PCP - General (Internal Medicine)  Inpatient Treatment Team: Treatment Team: Attending Provider: Elmarie Shiley, MD; Technician: April R Juarez, [place]; Rounding Team: Fatima Blank, MD; Consulting Physician: Nolon Nations, MD; Consulting Physician: Otis Brace, MD; Registered Nurse: Yvette Rack, RN; Registered Nurse: Oleta Mouse, RN; Registered Nurse: Nolene Ebbs, RN  Subjective:  Staying in bed Denies much pain BMs after enema  Objective:  Vital signs:  Vitals:   08/12/16 0441 08/12/16 1455 08/12/16 2254 08/13/16 0645  BP: (!) 91/51 (!) 87/51 (!) 93/51 (!) 89/54  Pulse: 84 69 71 72  Resp: _0 Temp: (!)  100.5 F (38.1 C) 98.8 F (37.1 C) 99 F (37.2 C) 98.9 F (37.2 C)  TempSrc: Oral Oral Oral Oral  SpO2: 92% 93% 95% 96%  Weight:      Height:        Last BM Date: 08/12/16  Intake/Output   Yesterday:  03/17 0701 - 03/18 0700 In: 3225.3 [P.O.:732; I.V.:2343.3; IV Piggyback:150] Out: 1003 [Urine:1000; Stool:3] This shift:  No intake/output data recorded.  Bowel function:  Flatus: YES  BM:  YES  Drain: (No drain)   Physical Exam:  General: Pt awake/alert/oriented x4 in No acute distress Eyes: PERRL, normal EOM.  Sclera clear.  No icterus Neuro: CN II-XII intact w/o focal sensory/motor deficits. Lymph: No head/neck/groin lymphadenopathy Psych:  No delerium/psychosis/paranoia HENT: Normocephalic, Mucus membranes moist.  No thrush Neck: Supple, No tracheal deviation Chest: No chest wall pain w good excursion CV:  Pulses intact.  Regular rhythm MS: Normal AROM mjr joints.  No obvious deformity Abdomen: Soft.  Nondistended.  Nontender.  No evidence of peritonitis.  No incarcerated hernias. Ext:  SCDs BLE.  No mjr edema.  No cyanosis Skin: No petechiae / purpura  Results:   Labs: Results for orders placed or performed during the hospital encounter of 08/09/16 (from the past 48 hour(s))  Glucose, capillary     Status: Abnormal   Collection Time: 08/11/16  7:42 AM  Result Value Ref Range   Glucose-Capillary 178 (H) 65 - 99 mg/dL  Glucose, capillary     Status: Abnormal   Collection Time: 08/11/16 12:09 PM  Result Value Ref Range  Glucose-Capillary 162 (H) 65 - 99 mg/dL  Lactic acid, plasma     Status: None   Collection Time: 08/11/16 12:29 PM  Result Value Ref Range   Lactic Acid, Venous 0.8 0.5 - 1.9 mmol/L  Lactic acid, plasma     Status: None   Collection Time: 08/11/16  3:54 PM  Result Value Ref Range   Lactic Acid, Venous 0.9 0.5 - 1.9 mmol/L  Glucose, capillary     Status: Abnormal   Collection Time: 08/11/16  8:30 PM  Result Value Ref Range    Glucose-Capillary 193 (H) 65 - 99 mg/dL  Glucose, capillary     Status: None   Collection Time: 08/12/16 12:24 AM  Result Value Ref Range   Glucose-Capillary 70 65 - 99 mg/dL  Vitamin B12     Status: None   Collection Time: 08/12/16  4:23 AM  Result Value Ref Range   Vitamin B-12 768 180 - 914 pg/mL    Comment: (NOTE) This assay is not validated for testing neonatal or myeloproliferative syndrome specimens for Vitamin B12 levels. Performed at [facility], [place] [address] [place], [place] [address]   Folate     Status: None   Collection Time: 08/12/16  4:23 AM  Result Value Ref Range   Folate 11.9 >5.9 ng/mL    Comment: Performed at [place] [address], [place], [place] [address]  Iron and TIBC     Status: Abnormal   Collection Time: 08/12/16  4:23 AM  Result Value Ref Range   Iron 9 (L) 28 - 170 ug/dL   TIBC 193 (L) 250 - 450 ug/dL   Saturation Ratios 5 (L) 10.4 - 31.8 %   UIBC 184 ug/dL    Comment: Performed at [facility], [place] [address], [place], [place] [address]  Ferritin     Status: None   Collection Time: 08/12/16  4:23 AM  Result Value Ref Range   Ferritin 108 11 - 307 ng/mL    Comment: Performed at [facility], [place] [address], [place],  [address]  Reticulocytes     Status: Abnormal   Collection Time: 08/12/16  4:23 AM  Result Value Ref Range   Retic Ct Pct 0.6 0.4 - 3.1 %   RBC. 2.80 (L) 3.87 - 5.11 MIL/uL   Retic Count, Manual 16.8 (L) 19.0 - 186.0 K/uL  CBC     Status: Abnormal   Collection Time: 08/12/16  4:23 AM  Result Value Ref Range   WBC 11.8 (H) 4.0 - 10.5 K/uL   RBC 2.80 (L) 3.87 - 5.11 MIL/uL   Hemoglobin 8.4 (L) 12.0 - 15.0 g/dL   HCT 25.2 (L) 36.0 - 46.0 %   MCV 90.0 78.0 - 100.0 fL   MCH 30.0 26.0 - 34.0 pg   MCHC 33.3 30.0 - 36.0 g/dL   RDW 14.8 11.5 - 15.5 %   Platelets 165 150 - 400 K/uL  Basic metabolic panel     Status: Abnormal   Collection Time: 08/12/16  4:23 AM  Result Value Ref Range    Sodium 136 135 - 145 mmol/L   Potassium 3.5 3.5 - 5.1 mmol/L   Chloride 107 101 - 111 mmol/L   CO2 24 22 - 32 mmol/L   Glucose, Bld 120 (H) 65 - 99 mg/dL   BUN 8 6 - 20 mg/dL   Creatinine, Ser 0.85 0.44 - 1.00 mg/dL   Calcium 7.4 (L) 8.9 -  10.3 mg/dL   GFR calc non Af Amer >60 >60 mL/min   GFR calc Af Amer >60 >60 mL/min    Comment: (NOTE) The eGFR has been calculated using the CKD EPI equation. This calculation has not been validated in all clinical situations. eGFR's persistently <60 mL/min signify possible Chronic Kidney Disease.    Anion gap 5 5 - 15  Glucose, capillary     Status: Abnormal   Collection Time: 08/12/16  4:39 AM  Result Value Ref Range   Glucose-Capillary 119 (H) 65 - 99 mg/dL  Glucose, capillary     Status: Abnormal   Collection Time: 08/12/16  8:02 AM  Result Value Ref Range   Glucose-Capillary 144 (H) 65 - 99 mg/dL  Glucose, capillary     Status: Abnormal   Collection Time: 08/12/16 11:28 AM  Result Value Ref Range   Glucose-Capillary 147 (H) 65 - 99 mg/dL  Glucose, capillary     Status: None   Collection Time: 08/12/16  4:34 PM  Result Value Ref Range   Glucose-Capillary 80 65 - 99 mg/dL  Glucose, capillary     Status: Abnormal   Collection Time: 08/12/16  7:52 PM  Result Value Ref Range   Glucose-Capillary 212 (H) 65 - 99 mg/dL  Glucose, capillary     Status: Abnormal   Collection Time: 08/12/16 11:57 PM  Result Value Ref Range   Glucose-Capillary 119 (H) 65 - 99 mg/dL  Glucose, capillary     Status: Abnormal   Collection Time: 08/13/16  4:44 AM  Result Value Ref Range   Glucose-Capillary 128 (H) 65 - 99 mg/dL    Imaging / Studies: No results found.  Medications / Allergies: per chart  Antibiotics: Anti-infectives    Start     Dose/Rate Route Frequency Ordered Stop   08/10/16 1000  piperacillin-tazobactam (ZOSYN) IVPB 3.375 g     3.375 g 12.5 mL/hr over 240 Minutes Intravenous Every 8 hours 08/10/16 0817     08/10/16 0900  azithromycin  (ZITHROMAX) 500 mg in dextrose 5 % 250 mL IVPB  Status:  Discontinued     500 mg 250 mL/hr over 60 Minutes Intravenous Daily 08/10/16 0802 08/12/16 1457   08/09/16 2300  Ampicillin-Sulbactam (UNASYN) 3 g in sodium chloride 0.9 % 100 mL IVPB  Status:  Discontinued     3 g 200 mL/hr over 30 Minutes Intravenous Every 8 hours 08/09/16 2149 08/10/16 0801   08/09/16 1800  piperacillin-tazobactam (ZOSYN) IVPB 3.375 g     3.375 g 100 mL/hr over 30 Minutes Intravenous  Once 08/09/16 1755 08/09/16 1906   08/09/16 1800  vancomycin (VANCOCIN) IVPB 1000 mg/200 mL premix     1,000 mg 200 mL/hr over 60 Minutes Intravenous  Once 08/09/16 1755 08/09/16 2007        Note: Portions of this report may have been transcribed using voice recognition software. Every effort was made to ensure accuracy; however, inadvertent computerized transcription errors may be present.   Any transcriptional errors that result from this process are unintentional.     Adin Hector, M.D., F.A.C.S. Gastrointestinal and Minimally Invasive Surgery [facility] [address] [place], [place] [address] [phone] Main / Paging   08/13/2016

## 2016-08-13 NOTE — Progress Notes (Signed)
[facility] Gastroenterology Progress Note  Mackenzie Mendoza 83 y.o. September 12, 1933  CC:  Abdominal pain   Subjective:  Patient's received a milk of molasses enema yesterday followed by large bowel movement. Patients abdominal pain is improving following bowel movements. Denied nausea or vomiting. Borderline blood pressure this morning noted  ROS : Positive for abdominal pain and constipation   Objective: Vital signs in last 24 hours: Vitals:   08/12/16 2254 08/13/16 0645  BP: (!) 93/51 (!) 89/54  Pulse: 71 72  Resp: 15 15  Temp: 99 F (37.2 C) 98.9 F (37.2 C)    Physical Exam:  Constitutional: She is oriented to person, place, and time. Not In acute distress HENT:  Head: Normocephalic and atraumatic.  Mouth/Throat: Oropharynx is clear and moist.  Eyes: EOM are normal. No scleral icterus.  Neck: Normal range of motion. Neck supple.  Cardiovascular: Normal rate and regular rhythm.  Murmur heard. Pulmonary/Chest: Effort normal and breath sounds normal. She has no wheezes. She exhibits tenderness.  Tenderness to her left lower ribs.  Abdominal: Soft. Bowel sounds are normal. Abdominal distention improving. Tenderness also improving. No peritoneal signs    Lab Results:  Recent Labs  08/12/16 0423 08/13/16 0744  NA 136 137  K 3.5 3.2*  CL 107 109  CO2 24 24  GLUCOSE 120* 145*  BUN 8 <5*  CREATININE 0.85 0.66  CALCIUM 7.4* 7.5*   No results for input(s): AST, ALT, ALKPHOS, BILITOT, PROT, ALBUMIN in the last 72 hours.  Recent Labs  08/12/16 0423 08/13/16 0744  WBC 11.8* 8.0  HGB 8.4* 8.7*  HCT 25.2* 25.5*  MCV 90.0 89.2  PLT 165 156   No results for input(s): LABPROT, INR in the last 72 hours.    Assessment/Plan: - Abdominal pain .  Unclear etiology.  ?? From constipation. Improvement in pain following bowel movements yesterday - History of Whipple's procedure  in 2008 for tumor of ampulla of veter  - Leukocytosis. ?? Aspiration pneumonia versus atypical lung  infection based on recent CT. Improving - Constipation - Anemia. No overt bleeding. - history of anastomotic ulcer  Recommendations -------------------------- - Continue MiraLAX as needed. Patient declined  enema today as she had a large bowel movement yesterday - Advance diet to GI soft. No plan for inpatient endoscopic workup. - Leukocytosis is also improving. - GI will follow.    Otis Brace MD, FACP 08/13/2016, 11:50 AM  Pager [phone]  If no answer or after 5 PM call [phone]

## 2016-08-14 LAB — CULTURE, BLOOD (ROUTINE X 2)
Culture: NO GROWTH
Culture: NO GROWTH

## 2016-08-14 LAB — GLUCOSE, CAPILLARY
GLUCOSE-CAPILLARY: 133 mg/dL — AB (ref 65–99)
GLUCOSE-CAPILLARY: 168 mg/dL — AB (ref 65–99)
Glucose-Capillary: 102 mg/dL — ABNORMAL HIGH (ref 65–99)
Glucose-Capillary: 151 mg/dL — ABNORMAL HIGH (ref 65–99)

## 2016-08-14 MED ORDER — POLYSACCHARIDE IRON COMPLEX 150 MG PO CAPS: 150.0000 mg | ORAL_CAPSULE | Freq: Every day | ORAL | 0 refills | Status: DC

## 2016-08-14 MED ORDER — ENOXAPARIN SODIUM 30 MG/0.3ML ~~LOC~~ SOLN: 30.0000 mg | SUBCUTANEOUS | Status: DC

## 2016-08-14 MED ORDER — PANTOPRAZOLE SODIUM 40 MG PO TBEC: 40.0000 mg | DELAYED_RELEASE_TABLET | Freq: Two times a day (BID) | ORAL | 0 refills | Status: AC

## 2016-08-14 MED ORDER — SACCHAROMYCES BOULARDII 250 MG PO CAPS: 250.0000 mg | ORAL_CAPSULE | Freq: Two times a day (BID) | ORAL | 0 refills | Status: DC

## 2016-08-14 MED ORDER — BOOST / RESOURCE BREEZE PO LIQD: 1.0000 | Freq: Three times a day (TID) | ORAL | 0 refills | Status: AC

## 2016-08-14 MED ORDER — POLYETHYLENE GLYCOL 3350 17 G PO PACK
17.0000 g | PACK | Freq: Every day | ORAL | Status: DC
  Filled 2016-08-14: qty 1

## 2016-08-14 MED ORDER — ACETAMINOPHEN 500 MG PO TABS: 500.0000 mg | ORAL_TABLET | Freq: Four times a day (QID) | ORAL | 0 refills | Status: AC | PRN

## 2016-08-14 MED ORDER — AMOXICILLIN-POT CLAVULANATE 875-125 MG PO TABS: 1.0000 | ORAL_TABLET | Freq: Two times a day (BID) | ORAL | 0 refills | Status: DC

## 2016-08-14 MED ORDER — POLYETHYLENE GLYCOL 3350 17 G PO PACK: 17.0000 g | PACK | Freq: Every day | ORAL | 0 refills | Status: DC

## 2016-08-14 MED ORDER — BOOST / RESOURCE BREEZE PO LIQD: 1.0000 | Freq: Three times a day (TID) | ORAL | Status: DC

## 2016-08-14 NOTE — Progress Notes (Signed)
Spoke with patient at bedside. States she lives with family, daughter does not drive so she is having difficulty find transportation home. She will continue to try. S[place]ed bus [place] run [place] far. Patient denies any needs at this time, has RW, no new DME needed. Will continue to follow and assist with transportation if needed.

## 2016-08-14 NOTE — Care Management Important Message (Signed)
Important Message  Patient Details  Name: Mackenzie Mendoza MRN: [id number] Date of Birth: 01/05/1934   Medicare Important Message Given:  Yes    Kerin Salen 08/14/2016, 1:19 PMImportant Message  Patient Details  Name: Mackenzie Mendoza MRN: [id number] Date of Birth: July 30, 1933   Medicare Important Message Given:  Yes    Kerin Salen 08/14/2016, 1:19 PM

## 2016-08-14 NOTE — Progress Notes (Signed)
Subjective: She denies any pain, sounds like she just had full liquids for supper last PM.  Has not walked much  Objective: Vital signs in last 24 hours: Temp:  [98.4 F (36.9 C)-98.8 F (37.1 C)] 98.4 F (36.9 C) (03/19 0631) Pulse Rate:  [75-81] 77 (03/19 0631) Resp:  [16] 16 (03/19 0631) BP: (106-118)/(42-63) 118/58 (03/19 0631) SpO2:  [96 %-98 %] 98 % (03/19 0631) Last BM Date: 08/12/16 960 PO 1878 IV Urine x 6 Stool x 1 Afebrile, VSS No labs this Am  K+ 3.2 yesterday Anemia- 8.7/25.5 CT scan 08/09/16: Intake/Output from previous day: 03/18 0701 - 03/19 0700 In: 2938.3 [P.O.:960; I.V.:1878.3; IV Piggyback:100] Out: -  Intake/Output this shift: No intake/output data recorded.  General appearance: alert, cooperative and no distress GI: soft, non-tender; bowel sounds normal; no masses,  no organomegaly  Lab Results:   Recent Labs  08/12/16 0423 08/13/16 0744  WBC 11.8* 8.0  HGB 8.4* 8.7*  HCT 25.2* 25.5*  PLT 165 156    BMET  Recent Labs  08/12/16 0423 08/13/16 0744  NA 136 137  K 3.5 3.2*  CL 107 109  CO2 24 24  GLUCOSE 120* 145*  BUN 8 <5*  CREATININE 0.85 0.66  CALCIUM 7.4* 7.5*   PT/INR No results for input(s): LABPROT, INR in the last 72 hours.   Recent Labs Lab 08/09/16 1530  AST 18  ALT 13*  ALKPHOS 80  BILITOT 0.5  PROT 6.7  ALBUMIN 3.2*     Lipase     Component Value Date/Time   LIPASE 10 (L) 08/09/2016 1530     Studies/Results: No results found.  Medications: . citalopram  10 mg Oral Daily  . enoxaparin (LOVENOX) injection  40 mg Subcutaneous Q24H  . feeding supplement (ENSURE ENLIVE)  237 mL Oral BID BM  . insulin aspart  0-9 Units Subcutaneous Q4H  . lip balm  1 application Topical BID  . multivitamins with iron  1 tablet Oral Daily  . pantoprazole  40 mg Oral BID AC  . piperacillin-tazobactam (ZOSYN)  IV  3.375 g Intravenous Q8H  . polyethylene glycol  17 g Oral BID  . saccharomyces boulardii  250 mg Oral  BID   . dextrose 5 % and 0.9% NaCl 100 mL/hr at 08/14/16 6195    Assessment/Plan Abdominal pain, nonspecific free fluid in abdomen - no pain this AM - abdominal surgical history includes Whipple for ampulla of vater tumor 2008 by Dr. Doyle As[facility]w at [facility]; hysterectomy  - vague global/epigastric abdominal pain for months, worse x2 weeks, severe x1 day - WBC 17 on arrival, up to 26 at one point, now normal - CT of the abdomen and pelvis showed changes at the lung bases suggestive of aspiration pneumonia or atypical infection, as well as nonspecific free fluid in the upper abdomen and pelvis possibly 2/2 to hypoproteinemia versus inflammation, peritonitis, or ascites   Aspiration pneumonia - on zosyn and azithromycin Constipation Hypokalemia Hiatal hernia H/o amulla of vater cancer s/p Whipple 2008 H/o large jejunal anastomotic ulcer in 2011 with follow-up EGD in 2012 showing a small jejunal ulcer - scheduled for endoscopy 07/26/16 with Dr. Michail Sermon but missed appointment GERD  Depression/anxiety Chronic anemia ID - zosyn 3/14>> day 6, azithromycin 3/15 -17/18 - completed VTE - Lovenox FEN - IVF,  Soft diet  Plan:  No surgical issues identified.  She needs to be mobilized.  She is asking if she is going home.  I have added some mobilization orders along  with PT evaluation.   LOS: 5 days    JENNINGS,WILLARD 08/14/2016 [phone]  Tolerating diet Having bms; Afebrile. Normal hr, wbc normal Exam benign Doesn't abx for abdomen Will sign off. pls call with questions.  Leighton Ruff. Redmond Pulling, MD, FACS General, Bariatric, & Minimally Invasive Surgery [facility], [place]

## 2016-08-14 NOTE — Evaluation (Signed)
Occupational Therapy Evaluation Patient Details Name: Mackenzie Mendoza MRN: [id number] DOB: October 30, 1933 Today's Date: 08/14/2016    History of Present Illness 83 y.o. 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 Pt will benefit from skilled OT to increase their safety and independence with ADL and functional mobility for ADL to facilitate discharge to venue listed below.      Follow Up Recommendations  No OT follow up;Supervision/Assistance - 24 hour          Precautions / Restrictions Precautions Precautions: None      Mobility Bed Mobility Overal bed mobility: Modified Independent             General bed mobility comments: pt inchair  Transfers Overall transfer level: Needs assistance Equipment used: Rolling walker (2 wheeled) Transfers: Sit to/from Stand Sit to Stand: Supervision         General transfer comment: verbal cues for hand placement with RW    Balance Overall balance assessment:  (denies hx of falls)                                          ADL Overall ADL's : Needs assistance/impaired Eating/Feeding: Set up;Sitting   Grooming: Set up   Upper Body Bathing: Set up;Sitting   Lower Body Bathing: Minimal assistance;Sit to/from stand;Cueing for safety;Cueing for sequencing   Upper Body Dressing : Set up;Sitting   Lower Body Dressing: Minimal assistance;Sit to/from stand;Cueing for sequencing   Toilet Transfer: Min guard;RW;Ambulation   Toileting- Water quality scientist and Hygiene: Min guard;Sit to/from stand;Cueing for safety;Cueing for sequencing       Functional mobility during ADLs: Min  guard;Cueing for safety;Cueing for sequencing General ADL Comments: pt would benefit from a tub seat after discussion. Will discuss with family     Vision Patient Visual Report: No change from baseline              Pertinent Vitals/Pain Pain Assessment: 0-10 Pain Score: 3  Pain Location: back Pain Descriptors / Indicators: Aching Pain Intervention(s): Limited activity within patient's tolerance;Monitored during session;Repositioned     Hand Dominance     Extremity/Trunk Assessment Upper Extremity Assessment Upper Extremity Assessment: Generalized weakness   Lower Extremity Assessment Lower Extremity Assessment: Overall WFL for tasks assessed   Cervical / Trunk Assessment Cervical / Trunk Assessment: Kyphotic   Communication Communication Communication: No difficulties   Cognition Arousal/Alertness: Awake/alert Behavior During Therapy: WFL for tasks assessed/performed Overall Cognitive Status: Within Functional Limits for tasks assessed                                Home Living Family/patient expects to be discharged to:: Private residence Living Arrangements: Spouse/significant other;Children Available Help at Discharge: Family Type of Home: House Home Access: Stairs to enter Technical brewer of Steps: 2   Home Layout: One level (1 step from den to [employer])     Bathroom Shower/Tub: Teacher, early years/pre: Standard     Home Equipment: Environmental consultant - 2 wheels  Prior Functioning/Environment Level of Independence: Independent                 OT Problem List: Decreased strength;Decreased activity tolerance      OT Treatment/Interventions: Self-care/ADL training;DME and/or AE instruction;Patient/family education    OT Goals(Current goals can be found in the care plan section) Acute Rehab OT Goals Patient Stated Goal: home asap OT Goal Formulation: With patient Time For Goal Achievement: 08/21/16 Potential to  Achieve Goals: Good  OT Frequency: Min 2X/week              End of Session Equipment Utilized During Treatment: Rolling walker Nurse Communication: Mobility status  Activity Tolerance: Patient tolerated treatment well Patient left: in chair;with call bell/phone within reach;with chair alarm set  OT Visit Diagnosis: Unsteadiness on feet (R26.81)                ADL either performed or assessed with clinical judgement  Time: 1840-3754 OT Time Calculation (min): 32 min Charges:  OT General Charges $OT Visit: 1 Procedure OT Evaluation $OT Eval Moderate Complexity: 1 Procedure OT Treatments $Self Care/Home Management : 8-22 mins G-Codes:     Kari Baars, OT   [phone]  Payton Mccallum D 08/14/2016, 12:58 PM

## 2016-08-14 NOTE — Progress Notes (Signed)
Pt has had good results w/ enemas and laxatives (currently on Miralax bid)--initially firm stools, now soft but not diarrhea.  Feels she's well cleaned-out, and doesn't feel she needs digital rectal exam to confirm absence of fecal impaction w/ overflow output.  Abd pn resolved.  Has been on liq diet, ordered for adv to soft.  Pt states that she's had tendency for constipation for the past 6 mos, and uses Miralax at home "occasionally."  Exam:  NAD, alert and coherent, abd nondistended and nontender, w/ moderate diffuse tympany.  IMPR:  1. abd pn presumably due to fecal obstipation, resolved 2. Tend for constipation  PLAN:  1. Have advanced to heart healthy diet (is to get soft diet for breakfast). 2. Instructed pt in benefit of DAILY maintenance therapy w/ Miralax, using dose titration to prevent "overshoot" diarrhea 3. Will decrease in-house Miralax to ONCE a day 4. Will sign off; call us if we can be of further help.  Cleotis Nipper, M.D. Pager [phone] If no answer or after 5 PM call [phone]

## 2016-08-14 NOTE — Evaluation (Signed)
Physical Therapy Evaluation Patient Details Name: Mackenzie Mendoza MRN: [id number] DOB: 20-Jun-1933 Today's Date: 08/14/2016   History of Present Illness  83 y.o. 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 Pt currently with functional limitations due to the deficits listed below (see PT Problem List).  Pt mobilizing well with RW and has one to use at home. Pt reports she lives with spouse and daughter. Pt will benefit from skilled PT to increase their independence and safety with mobility to allow discharge home with family and no f/u PT.    Follow Up Recommendations No PT follow up    Equipment Recommendations  None recommended by PT    Recommendations for Other Services       Precautions / Restrictions Precautions Precautions: Fall Restrictions Weight Bearing Restrictions: No      Mobility  Bed Mobility Overal bed mobility: Modified Independent                Transfers Overall transfer level: Needs assistance Equipment used: Rolling walker (2 wheeled) Transfers: Sit to/from Stand Sit to Stand: Supervision         General transfer comment: verbal cues for hand placement with RW  Ambulation/Gait Ambulation/Gait assistance: Min guard Ambulation Distance (Feet): 240 Feet Assistive device: Rolling walker (2 wheeled) Gait Pattern/deviations: Step-through pattern;Decreased stride length     General Gait Details: utilized RW for stability today, agreeable to use RW upon d/c home for a couples days until feeling 100%  Stairs            Wheelchair Mobility    Modified Rankin (Stroke Patients Only)       Balance Overall balance assessment:  (denies hx of falls)                                          Pertinent Vitals/Pain Pain Assessment: 0-10 Pain Score: 3  Pain Location: back Pain Descriptors / Indicators: Aching Pain Intervention(s): Limited activity within patient's tolerance;Monitored during session;Repositioned    Home Living Family/patient expects to be discharged to:: Private residence Living Arrangements: Spouse/significant other;Children Available Help at Discharge: Family Type of Home: House Home Access: Stairs to enter   Technical brewer of Steps: 2 Home Layout: One level (1 step from den to [employer]) Home Equipment: Environmental consultant - 2 wheels      Prior Function Level of Independence: Independent               Hand Dominance        Extremity/Trunk Assessment        Lower Extremity Assessment Lower Extremity Assessment: Overall WFL for tasks assessed    Cervical / Trunk Assessment Cervical / Trunk Assessment: Kyphotic  Communication   Communication: No difficulties  Cognition Arousal/Alertness: Awake/alert Behavior During Therapy: WFL for tasks assessed/performed Overall Cognitive Status: Within Functional Limits for tasks assessed                      General Comments      Exercises     Assessment/Plan    PT Assessment Patient needs continued PT services  PT Problem List Decreased strength;Decreased balance;Decreased knowledge of use of DME;Decreased activity tolerance;Decreased mobility  PT Treatment Interventions Gait training;DME instruction;Therapeutic activities;Therapeutic exercise;Functional mobility training;Patient/family education    PT Goals (Current goals can be found in the Care Plan section)  Acute Rehab PT Goals PT Goal Formulation: With patient Time For Goal Achievement: 08/21/16 Potential to Achieve Goals: Good    Frequency Min 3X/week   Barriers to discharge        Co-evaluation               End of Session   Activity Tolerance: Patient tolerated treatment well Patient left: in  chair;with call bell/phone within reach;with chair alarm set Nurse Communication: Mobility status PT Visit Diagnosis: Difficulty in walking, not elsewhere classified (R26.2)         Time: 9924-2683 PT Time Calculation (min) (ACUTE ONLY): 12 min   Charges:   PT Evaluation $PT Eval Low Complexity: 1 Procedure     PT G Codes:         Tsugio Elison,KATHrine E 08/14/2016, 12:27 PM Carmelia Bake, PT, DPT 08/14/2016 Pager: [phone]

## 2016-08-14 NOTE — Discharge Summary (Addendum)
Physician Discharge Summary  SHANIKWA STATE QIH:[id number] DOB: 09-04-1933 DOA: 08/09/2016  PCP: Irven Shelling, MD  Admit date: 08/09/2016 Discharge date: 08/14/2016  Admitted From: Home  Disposition: Home   Recommendations for Outpatient Follow-up:  1. Follow up with PCP in 1-2 weeks 2. Please obtain BMP/CBC in one week     Discharge Condition: stable.  CODE STATUS: full code.  Diet recommendation: Heart Healthy   Brief/Interim Summary:  NIAH HEINLE a 83 y.o.femalewith medical history significant for ampulla of Vatertumor status post Whipple procedure, chronic abdominal pain, GERD, depression and anxiety, and chronic anemia who presents to the emergency department for evaluation of worsening abdominal pain, generalized weakness and malaise, cough, and dyspnea. Patient is a very poor historian and history is augmented by the report of her son at the bedside. She has reportedly been suffering from vague generalized abdominal discomfort for several months with no alleviating or exacerbating factors identified. Pain is worsened significantly over the past 2 weeks, and particularly over the past couple days. She now describes the pain as severe, sharp, localized to the upper quadrants and epigastrium, with no identifiable alleviating or exacerbating factors, no nausea, and no vomiting or diarrhea. Patient reports frequent chills.  She endorses a nonproductive cough and exertional dyspnea, worsening insidiously over the past week or more.    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 Blood cultures no growth to date.  WBC trending down.  Discharge on Augmentin for 2 more days.    2-Abdominal pain, Acute on Chronic;  CT with non specific free fluid in abdomen, differential peritonitis, vs hypoalbuminemia. Patient with persistent abdominal pain, leukocytosis, fever. Will consult General sx for evaluation. Surgery recommend conservative care, no need for sx intervention.  IV protonix.  GI consulted and following.  Continue with Miralax.  Pain improved, might be related to constipation.  Patient tolerating diet/. Plan to discharge today.   3-Hypotension;  Lactic acid normal.  IV fluids.  resolved with IV fluids.   Anemia; follow trend.   anemia panel. Iron deficiency.  Will provide prescriptions for iron   Severe protein  caloric malnutrition.  Eating more.  Feeding supplements.   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 [facility]   Normocytic anemia   Abdominal pain   Depression   Acute respiratory failure with hypoxia [facility])    Discharge Instructions  Discharge Instructions    Diet - low sodium heart healthy    Complete by:  As directed    Diet - low sodium heart healthy    Complete by:  As directed    Increase activity slowly    Complete by:  As directed    Increase activity slowly    Complete by:  As directed      Allergies as of 08/14/2016      Reactions   Aspirin Hives   Codeine-promethazine [promethazine-codeine] Nausea And Vomiting   Estrogens Hives   Phenobarbital Itching      Medication List    STOP taking these medications   esomeprazole 20 MG capsule Commonly known as:  NEXIUM  TAKE these medications   acetaminophen 500 MG tablet Commonly known as:  TYLENOL Take 1 tablet (500 mg total) by mouth every 6 (six) hours as needed for moderate pain or headache. What changed:  how much to take   albuterol 108 (90 Base) MCG/ACT inhaler Commonly known as:  PROVENTIL HFA;VENTOLIN HFA Inhale 1-2 puffs  into the lungs every 6 (six) hours as needed for wheezing or shortness of breath.   amoxicillin-clavulanate 875-125 MG tablet Commonly known as:  AUGMENTIN Take 1 tablet by mouth 2 (two) times daily.   cholecalciferol 1000 units tablet Commonly known as:  VITAMIN D Take 2,000 Units by mouth daily as needed (takes occasionally).   citalopram 10 MG tablet Commonly known as:  CELEXA Take 10 mg by mouth daily.   feeding supplement Liqd Take 1 Container by mouth 3 (three) times daily between meals.   iron polysaccharides 150 MG capsule Commonly known as:  NU-IRON Take 1 capsule (150 mg total) by mouth daily.   LORazepam 1 MG tablet Commonly known as:  ATIVAN Take 1 mg by mouth See admin instructions. Takes 1 tab daily, and can take 1 addt'l tab as needed for anxiety   pantoprazole 40 MG tablet Commonly known as:  PROTONIX Take 1 tablet (40 mg total) by mouth 2 (two) times daily before a meal.   polyethylene glycol packet Commonly known as:  MIRALAX / GLYCOLAX Take 17 g by mouth daily. Start taking on:  08/15/2016   saccharomyces boulardii 250 MG capsule Commonly known as:  FLORASTOR Take 1 capsule (250 mg total) by mouth 2 (two) times daily.   VITAMIN B 12 PO Take 2,500 mcg by mouth once a week.            Durable Medical Equipment        Start     Ordered   08/14/16 1142  For home use only DME 3 n 1  Once     08/14/16 1142     Follow-up San[place], MD Follow up in 1 week(s).   Specialty:  Internal Medicine Contact information: [address] [place] [address] [phone]          Allergies  Allergen Reactions  . Aspirin Hives  . Codeine-Promethazine [Promethazine-Codeine] Nausea And Vomiting  . Estrogens Hives  . Phenobarbital Itching    Consultations:  GI  Surgery    Procedures/Studies: Dg Chest 2 View  Result Date: 07/28/2016 CLINICAL DATA:  83 y/o  F; fatigue and productive cough. EXAM: CHEST   2 VIEW COMPARISON:  10/18/2012 chest radiograph. FINDINGS: Stable normal cardiac silhouette. Large hiatal hernia. Aortic atherosclerosis with calcification. Hyperinflated lungs with flattened diaphragms compatible with COPD. Coarse pulmonary markings likely representing chronic bronchitic changes. No focal consolidation. No pleural effusion or pneumothorax. Stable severe T12 as well as mild L2 and L3 compression deformities and kyphoplasty changes at T12 and L2. Bones are demineralized. No acute osseous abnormality is identified. IMPRESSION: 1. Large hiatal hernia. 2. Coarse pulmonary markings likely representing chronic bronchitic changes. No focal consolidation. COPD. 3. Stable lower thoracic and lumbar compression deformities and kyphoplasty changes. 4. Aortic atherosclerosis. Electronically Signed   By: Kristine Garbe M.D.   On: 07/28/2016 15:27   Ct Abdomen Pelvis W Contrast  Result Date: 08/09/2016 CLINICAL DATA:  Diffuse abdominal pain and back pain EXAM: CT ABDOMEN AND PELVIS WITH CONTRAST TECHNIQUE: Multidetector CT imaging of the abdomen and pelvis was performed using the standard protocol following  bolus administration of intravenous contrast. CONTRAST:  82m ISOVUE-300 IOPAMIDOL (ISOVUE-300) INJECTION 61% COMPARISON:  CT abdomen pelvis of 06/19/2016 FINDINGS: Lower chest: The tree-in-bud appearance of the lung bases is again noted when compared to the prior CT and most likely represents changes of chronic aspiration or possibly atypical infection such as mycobacterium avium complex. No pleural effusion is seen. Again a large hiatal hernia is present containing approximately 30-40% of the stomach. No obstruction is seen. Hepatobiliary: The liver enhances with no new abnormality and no ductal dilatation is seen. Surgical clips are present from prior cholecystectomy. 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 The body and  tail the pancreas is unremarkable. Spleen: Spleen is normal in size. Adrenals/Urinary Tract: The adrenal glands are unchanged. A large cyst again emanates from the upper pole of the right kidney and is unchanged. No hydronephrosis is seen. On delayed images, the pelvocaliceal systems are unremarkable. The ureters are not dilated. The urinary bladder is completely decompressed and cannot be assessed. Stomach/Bowel: The stomach view has below the hemidiaphragm is unremarkable with changes of Whipple procedure again noted. No small bowel distention is seen. There is feces throughout the colon. The terminal ileum is not well seen but no abnormality is noted. The appendix is not visualized. Vascular/Lymphatic: The abdominal aorta is normal in caliber with age consistent abdominal aortic atherosclerosis present. No adenopathy is seen. Reproductive: The uterus has previously been resected. No adnexal lesion is seen. Other: There is some free fluid within the pelvis and a small amount of fluid within the upper abdomen. With no evidence of solid organ injury, this is a nonspecific finding and could be non inflammatory as with hypoproteinemia or possibly due to ascites, inflammation, or perhaps peritonitis and clinical correlation is recommended. There is no evidence of free air to indicate bowel perforation. Musculoskeletal: The bones are diffusely osteopenic with vertebroplasty at T12 and L2. IMPRESSION: 1. Little change in appearance of the lung bases which could indicate chronic aspiration or possibly due to atypical infection as noted above. 2. No change in moderate to large hiatal hernia. 3. Some free fluid within the upper abdomen and pelvis which is nonspecific as described above. Consider noninflammatory cause such as hypoproteinemia versus inflammation, peritonitis, or ascites. 4. Prior Whipple procedure. Electronically Signed   By: PIvar DrapeM.D.   On: 08/09/2016 17:21      Subjective: She is feeling well,  no abdominal pain, ate breakfast   Discharge Exam: Vitals:   08/14/16 0631 08/14/16 1308  BP: (!) 118/58 (!) 116/55  Pulse: 77 72  Resp: 16 17  Temp: 98.4 F (36.9 C) 98.9 F (37.2 C)   Vitals:   08/13/16 1411 08/13/16 2217 08/14/16 0631 08/14/16 1308  BP: (!) 106/42 113/63 (!) 118/58 (!) 116/55  Pulse: 81 75 77 72  Resp: _0 Temp: 98.8 F (37.1 C) 98.7 F (37.1 C) 98.4 F (36.9 C) 98.9 F (37.2 C)  TempSrc: Oral Oral Oral Oral  SpO2: 96% 97% 98% 100%  Weight:      Height:        General: Pt is alert, awake, not in acute distress Cardiovascular: RRR, S1/S2 +, no rubs, no gallops Respiratory: CTA bilaterally, no wheezing, no rhonchi Abdominal: Soft, NT, ND, bowel sounds + Extremities: no edema, no cyanosis    The results of significant diagnostics from this hospitalization (including imaging, microbiology, ancillary and laboratory) are listed below for  reference.     Microbiology: Recent Results (from the past 240 hour(s))  Blood culture (routine x 2)     Status: None (Preliminary result)   Collection Time: 08/09/16  3:30 PM  Result Value Ref Range Status   Specimen Description BLOOD LEFT ANTECUBITAL  Final   Special Requests IN PEDIATRIC BOTTLE 1CC  Final   Culture   Final    NO GROWTH 4 DAYS Performed at [facility], [address] [place], [place] [address]    Report Status PENDING  Incomplete  Urine culture     Status: None   Collection Time: 08/09/16  4:22 PM  Result Value Ref Range Status   Specimen Description URINE, CATHETERIZED  Final   Special Requests NONE  Final   Culture   Final    NO GROWTH Performed at [facility], [address], [place], [place] [address]    Report Status 08/10/2016 FINAL  Final  Blood culture (routine x 2)     Status: None (Preliminary result)   Collection Time: 08/09/16  6:20 PM  Result Value Ref Range Status   Specimen Description BLOOD RIGHT FOREARM  Final   Special Requests BOTTLES DRAWN  AEROBIC AND ANAEROBIC 5CC  Final   Culture   Final    NO GROWTH 4 DAYS Performed at [facility], [place] [address] [place], [place] [address]    Report Status PENDING  Incomplete     Labs: BNP (last 3 results) No results for input(s): BNP in the last 8760 hours. Basic Metabolic Panel:  Recent Labs Lab 08/09/16 1530 08/09/16 1539 08/10/16 0444 08/11/16 0436 08/12/16 0423 08/13/16 0744  NA 132* 135 137 136 136 137  K 3.8 3.9 4.2 4.1 3.5 3.2*  CL 100* 101 107 108 107 109  CO2 23  --  _0 GLUCOSE 188* 192* 135* 65 120* 145*  BUN _1 <5*  CREATININE 0.92 0.80 0.84 0.88 0.85 0.66  CALCIUM 8.6*  --  7.8* 7.4* 7.4* 7.5*   Liver Function Tests:  Recent Labs Lab 08/09/16 1530  AST 18  ALT 13*  ALKPHOS 80  BILITOT 0.5  PROT 6.7  ALBUMIN 3.2*    Recent Labs Lab 08/09/16 1530  LIPASE 10*   No results for input(s): AMMONIA in the last 168 hours. CBC:  Recent Labs Lab 08/09/16 1530 08/09/16 1539 08/10/16 0444 08/11/16 0436 08/12/16 0423 08/13/16 0744  WBC 17.0*  --  26.0* 16.6* 11.8* 8.0  NEUTROABS 15.0*  --  24.0*  --   --   --   HGB 11.6* 11.9* 9.6* 8.9* 8.4* 8.7*  HCT 35.3* 35.0* 27.9* 26.8* 25.2* 25.5*  MCV 89.1  --  88.0 90.8 90.0 89.2  PLT PLATELET CLUMPS NOTED ON SMEAR, UNABLE TO ESTIMATE  --  233 169 165 156   Cardiac Enzymes: No results for input(s): CKTOTAL, CKMB, CKMBINDEX, TROPONINI in the last 168 hours. BNP: Invalid input(s): POCBNP CBG:  Recent Labs Lab 08/13/16 1957 08/14/16 0013 08/14/16 0427 08/14/16 0806 08/14/16 1154  GLUCAP 119* 151* 102* 133* 168*   D-Dimer No results for input(s): DDIMER in the last 72 hours. Hgb A1c No results for input(s): HGBA1C in the last 72 hours. Lipid Profile No results for input(s): CHOL, HDL, LDLCALC, TRIG, CHOLHDL, LDLDIRECT in the last 72 hours. Thyroid function studies No results for input(s): TSH, T4TOTAL, T3FREE, THYROIDAB in the last 72 hours.  Invalid input(s):  FREET3 Anemia  work up  Recent Labs  08/12/16 0423  VITAMINB12 768  FOLATE 11.9  FERRITIN 108  TIBC 193*  IRON 9*  RETICCTPCT 0.6   Urinalysis    Component Value Date/Time   COLORURINE YELLOW 08/09/2016 1622   APPEARANCEUR HAZY (A) 08/09/2016 1622   LABSPEC 1.018 08/09/2016 1622   PHURINE 5.0 08/09/2016 1622   GLUCOSEU NEGATIVE 08/09/2016 1622   HGBUR NEGATIVE 08/09/2016 1622   BILIRUBINUR NEGATIVE 08/09/2016 1622   KETONESUR NEGATIVE 08/09/2016 1622   PROTEINUR NEGATIVE 08/09/2016 1622   UROBILINOGEN 1.0 08/11/2014 2239   NITRITE NEGATIVE 08/09/2016 1622   LEUKOCYTESUR NEGATIVE 08/09/2016 1622   Sepsis Labs Invalid input(s): PROCALCITONIN,  WBC,  LACTICIDVEN Microbiology Recent Results (from the past 240 hour(s))  Blood culture (routine x 2)     Status: None (Preliminary result)   Collection Time: 08/09/16  3:30 PM  Result Value Ref Range Status   Specimen Description BLOOD LEFT ANTECUBITAL  Final   Special Requests IN PEDIATRIC BOTTLE 1CC  Final   Culture   Final    NO GROWTH 4 DAYS Performed at [facility], [place] [address], [place], [place] [address]    Report Status PENDING  Incomplete  Urine culture     Status: None   Collection Time: 08/09/16  4:22 PM  Result Value Ref Range Status   Specimen Description URINE, CATHETERIZED  Final   Special Requests NONE  Final   Culture   Final    NO GROWTH Performed at [facility], [address] [place], [place] [address]    Report Status 08/10/2016 FINAL  Final  Blood culture (routine x 2)     Status: None (Preliminary result)   Collection Time: 08/09/16  6:20 PM  Result Value Ref Range Status   Specimen Description BLOOD RIGHT FOREARM  Final   Special Requests BOTTLES DRAWN AEROBIC AND ANAEROBIC 5CC  Final   Culture   Final    NO GROWTH 4 DAYS Performed at [facility], [place] [address], [place], [place] [address]    Report Status PENDING  Incomplete     Time coordinating discharge: Over 30  minutes  SIGNED:   Elmarie Shiley, MD  [facility] 08/14/2016, 3:36 PM Pager   If 7PM-7AM, please contact night-coverage [email or web address] Password [email or web address]

## 2016-08-31 DIAGNOSIS — D649 Anemia, unspecified: Secondary | ICD-10-CM | POA: Diagnosis not present

## 2016-08-31 DIAGNOSIS — J189 Pneumonia, unspecified organism: Secondary | ICD-10-CM | POA: Diagnosis not present

## 2016-08-31 DIAGNOSIS — R109 Unspecified abdominal pain: Secondary | ICD-10-CM | POA: Diagnosis not present

## 2016-08-31 DIAGNOSIS — K5900 Constipation, unspecified: Secondary | ICD-10-CM | POA: Diagnosis not present

## 2016-08-31 DIAGNOSIS — E440 Moderate protein-calorie malnutrition: Secondary | ICD-10-CM | POA: Diagnosis not present

## 2017-05-18 DIAGNOSIS — E43 Unspecified severe protein-calorie malnutrition: Secondary | ICD-10-CM | POA: Diagnosis not present

## 2017-05-18 DIAGNOSIS — R634 Abnormal weight loss: Secondary | ICD-10-CM | POA: Diagnosis not present

## 2017-05-27 ENCOUNTER — Other Ambulatory Visit: Payer: Self-pay

## 2017-05-27 ENCOUNTER — Encounter (HOSPITAL_COMMUNITY): Payer: Self-pay

## 2017-05-27 ENCOUNTER — Inpatient Hospital Stay (HOSPITAL_COMMUNITY)
Admission: EM | Admit: 2017-05-27 | Discharge: 2017-05-30 | DRG: 871 | Disposition: A | Discharge status: Home / Self Care | Payer: Medicare Other | Attending: Internal Medicine | Admitting: Internal Medicine

## 2017-05-27 ENCOUNTER — Emergency Department (HOSPITAL_COMMUNITY): Payer: Medicare Other

## 2017-05-27 DIAGNOSIS — E876 Hypokalemia: Secondary | ICD-10-CM | POA: Diagnosis not present

## 2017-05-27 DIAGNOSIS — Z885 Allergy status to narcotic agent status: Secondary | ICD-10-CM

## 2017-05-27 DIAGNOSIS — E43 Unspecified severe protein-calorie malnutrition: Secondary | ICD-10-CM | POA: Diagnosis present

## 2017-05-27 DIAGNOSIS — J181 Lobar pneumonia, unspecified organism: Secondary | ICD-10-CM | POA: Diagnosis not present

## 2017-05-27 DIAGNOSIS — F17210 Nicotine dependence, cigarettes, uncomplicated: Secondary | ICD-10-CM | POA: Diagnosis present

## 2017-05-27 DIAGNOSIS — D649 Anemia, unspecified: Secondary | ICD-10-CM | POA: Diagnosis present

## 2017-05-27 DIAGNOSIS — R112 Nausea with vomiting, unspecified: Secondary | ICD-10-CM | POA: Diagnosis present

## 2017-05-27 DIAGNOSIS — A419 Sepsis, unspecified organism: Secondary | ICD-10-CM | POA: Diagnosis not present

## 2017-05-27 DIAGNOSIS — Z681 Body mass index (BMI) 19 or less, adult: Secondary | ICD-10-CM | POA: Diagnosis not present

## 2017-05-27 DIAGNOSIS — K219 Gastro-esophageal reflux disease without esophagitis: Secondary | ICD-10-CM | POA: Diagnosis present

## 2017-05-27 DIAGNOSIS — J690 Pneumonitis due to inhalation of food and vomit: Secondary | ICD-10-CM | POA: Diagnosis present

## 2017-05-27 DIAGNOSIS — J189 Pneumonia, unspecified organism: Secondary | ICD-10-CM | POA: Diagnosis not present

## 2017-05-27 DIAGNOSIS — R059 Cough, unspecified: Secondary | ICD-10-CM

## 2017-05-27 DIAGNOSIS — R109 Unspecified abdominal pain: Secondary | ICD-10-CM | POA: Diagnosis present

## 2017-05-27 DIAGNOSIS — C241 Malignant neoplasm of ampulla of Vater: Secondary | ICD-10-CM | POA: Diagnosis not present

## 2017-05-27 DIAGNOSIS — I9589 Other hypotension: Secondary | ICD-10-CM | POA: Diagnosis not present

## 2017-05-27 DIAGNOSIS — R1084 Generalized abdominal pain: Secondary | ICD-10-CM | POA: Diagnosis not present

## 2017-05-27 DIAGNOSIS — E860 Dehydration: Secondary | ICD-10-CM | POA: Diagnosis not present

## 2017-05-27 DIAGNOSIS — Z886 Allergy status to analgesic agent status: Secondary | ICD-10-CM

## 2017-05-27 DIAGNOSIS — J9601 Acute respiratory failure with hypoxia: Secondary | ICD-10-CM | POA: Diagnosis not present

## 2017-05-27 DIAGNOSIS — E870 Hyperosmolality and hypernatremia: Secondary | ICD-10-CM | POA: Diagnosis not present

## 2017-05-27 DIAGNOSIS — I959 Hypotension, unspecified: Secondary | ICD-10-CM | POA: Diagnosis present

## 2017-05-27 DIAGNOSIS — R531 Weakness: Secondary | ICD-10-CM

## 2017-05-27 DIAGNOSIS — R05 Cough: Secondary | ICD-10-CM

## 2017-05-27 DIAGNOSIS — Z79899 Other long term (current) drug therapy: Secondary | ICD-10-CM

## 2017-05-27 LAB — URINALYSIS, ROUTINE W REFLEX MICROSCOPIC
BACTERIA UA: NONE SEEN
BILIRUBIN URINE: NEGATIVE
Glucose, UA: NEGATIVE mg/dL
Hgb urine dipstick: NEGATIVE
KETONES UR: NEGATIVE mg/dL
LEUKOCYTES UA: NEGATIVE
Nitrite: NEGATIVE
PROTEIN: 100 mg/dL — AB
SQUAMOUS EPITHELIAL / LPF: NONE SEEN
Specific Gravity, Urine: 1.025 (ref 1.005–1.030)
pH: 5 (ref 5.0–8.0)

## 2017-05-27 LAB — CBC WITH DIFFERENTIAL/PLATELET
BASOS PCT: 0 %
BLASTS: 0 %
Band Neutrophils: 4 %
Basophils Absolute: 0 10*3/uL (ref 0.0–0.1)
Eosinophils Absolute: 0 10*3/uL (ref 0.0–0.7)
Eosinophils Relative: 0 %
HEMATOCRIT: 27.9 % — AB (ref 36.0–46.0)
HEMOGLOBIN: 9.3 g/dL — AB (ref 12.0–15.0)
LYMPHS PCT: 4 %
Lymphs Abs: 0.7 10*3/uL (ref 0.7–4.0)
MCH: 30.2 pg (ref 26.0–34.0)
MCHC: 33.3 g/dL (ref 30.0–36.0)
MCV: 90.6 fL (ref 78.0–100.0)
MONO ABS: 0.7 10*3/uL (ref 0.1–1.0)
Metamyelocytes Relative: 0 %
Monocytes Relative: 4 %
Myelocytes: 0 %
NEUTROS PCT: 88 %
NRBC: 0 /100{WBCs}
Neutro Abs: 16.5 10*3/uL — ABNORMAL HIGH (ref 1.7–7.7)
OTHER: 0 %
PROMYELOCYTES ABS: 0 %
Platelets: 265 10*3/uL (ref 150–400)
RBC: 3.08 MIL/uL — AB (ref 3.87–5.11)
RDW: 12.8 % (ref 11.5–15.5)
WBC: 17.9 10*3/uL — AB (ref 4.0–10.5)

## 2017-05-27 LAB — COMPREHENSIVE METABOLIC PANEL
ALBUMIN: 2.6 g/dL — AB (ref 3.5–5.0)
ALT: 9 U/L — ABNORMAL LOW (ref 14–54)
ANION GAP: 12 (ref 5–15)
AST: 19 U/L (ref 15–41)
Alkaline Phosphatase: 96 U/L (ref 38–126)
BILIRUBIN TOTAL: 0.7 mg/dL (ref 0.3–1.2)
BUN: 13 mg/dL (ref 6–20)
CO2: 24 mmol/L (ref 22–32)
Calcium: 8.4 mg/dL — ABNORMAL LOW (ref 8.9–10.3)
Chloride: 94 mmol/L — ABNORMAL LOW (ref 101–111)
Creatinine, Ser: 0.82 mg/dL (ref 0.44–1.00)
GFR calc non Af Amer: >60 mL/min (ref >60)
GLUCOSE: 138 mg/dL — AB (ref 65–99)
POTASSIUM: 3.4 mmol/L — AB (ref 3.5–5.1)
SODIUM: 130 mmol/L — AB (ref 135–145)
TOTAL PROTEIN: 6.1 g/dL — AB (ref 6.5–8.1)

## 2017-05-27 LAB — I-STAT TROPONIN, ED: Troponin i, poc: 0.02 ng/mL (ref 0.00–0.08)

## 2017-05-27 LAB — I-STAT CG4 LACTIC ACID, ED: Lactic Acid, Venous: 0.84 mmol/L (ref 0.5–1.9)

## 2017-05-27 LAB — PROTIME-INR
INR: 1.04
Prothrombin Time: 13.5 seconds (ref 11.4–15.2)

## 2017-05-27 LAB — LIPASE, BLOOD: LIPASE: 17 U/L (ref 11–51)

## 2017-05-27 MED ORDER — IOPAMIDOL (ISOVUE-300) INJECTION 61%
INTRAVENOUS | Status: AC
  Administered 2017-05-28: 100 mL via INTRAVENOUS
  Filled 2017-05-27: qty 100

## 2017-05-27 MED ORDER — DEXTROSE 5 % IV SOLN
1.0000 g | Freq: Once | INTRAVENOUS | Status: AC
  Administered 2017-05-28: 1 g via INTRAVENOUS
  Filled 2017-05-27: qty 10

## 2017-05-27 MED ORDER — SODIUM CHLORIDE 0.9 % IV BOLUS (SEPSIS)
1000.0000 mL | Freq: Once | INTRAVENOUS | Status: AC
  Administered 2017-05-27: 1000 mL via INTRAVENOUS

## 2017-05-27 MED ORDER — ONDANSETRON HCL 4 MG/2ML IJ SOLN: 4.0000 mg | Freq: Three times a day (TID) | INTRAMUSCULAR | Status: DC | PRN

## 2017-05-27 MED ORDER — MORPHINE SULFATE (PF) 4 MG/ML IV SOLN
2.0000 mg | Freq: Once | INTRAVENOUS | Status: DC
  Filled 2017-05-27: qty 1

## 2017-05-27 MED ORDER — SODIUM CHLORIDE 0.9 % IV BOLUS (SEPSIS)
500.0000 mL | Freq: Once | INTRAVENOUS | Status: AC
  Administered 2017-05-27: 500 mL via INTRAVENOUS

## 2017-05-27 MED ORDER — DEXTROSE 5 % IV SOLN
500.0000 mg | Freq: Once | INTRAVENOUS | Status: AC
  Administered 2017-05-28: 500 mg via INTRAVENOUS
  Filled 2017-05-27: qty 500

## 2017-05-27 MED ORDER — ONDANSETRON HCL 4 MG/2ML IJ SOLN
4.0000 mg | Freq: Once | INTRAMUSCULAR | Status: AC
  Administered 2017-05-27: 4 mg via INTRAVENOUS
  Filled 2017-05-27: qty 2

## 2017-05-27 MED ORDER — FAMOTIDINE IN NACL 20-0.9 MG/50ML-% IV SOLN
20.0000 mg | Freq: Once | INTRAVENOUS | Status: AC
  Administered 2017-05-27: 20 mg via INTRAVENOUS
  Filled 2017-05-27: qty 50

## 2017-05-27 NOTE — ED Triage Notes (Signed)
Pt reports n/v, anorexia, abdominal pain x 2 days. Denies urinary symptoms. A&Ox4.

## 2017-05-27 NOTE — ED Provider Notes (Signed)
[facility] EMERGENCY DEPARTMENT Provider Note   CSN: [id number] Arrival date & time: 05/27/17  1640     History   Chief Complaint Chief Complaint  Patient presents with  . Abdominal Pain  . Emesis    HPI Mackenzie Mendoza is a 82 y.o. female.  Patient with nausea, poor appetite, not eating/ drinking for 2-3 days. Symptoms moderate, persistent, worse today.  Also c/o upper abd pain. Hx 'bile duct tumor' s/p Whipple procedure/surgery 2008.  ?sujbective fever. No dysuria or gu c/o. Is generally weak, no focal numbness or weakness. +episodic cough, no sore throat. No chest pain. Feels faint/lightheaded when stands.    The history is provided by the patient.  Abdominal Pain   Associated symptoms include vomiting. Pertinent negatives include fever, diarrhea, dysuria and headaches.  Emesis   Associated symptoms include abdominal pain and cough. Pertinent negatives include no diarrhea, no fever and no headaches.    Past Medical History:  Diagnosis Date  . Cancer [facility])     Patient Active Problem List   Diagnosis Date Noted  . Abdominal pain 08/09/2016  . Aspiration pneumonia ([place]) 08/09/2016  . Depression 08/09/2016  . Acute respiratory failure with hypoxia ([place]) 08/09/2016  . Weight loss 03/05/2012  . 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 Osteoporosis 09/05/2011  . Normocytic anemia 09/05/2011  . Other B-complex deficiencies 09/05/2011    Past Surgical History:  Procedure Laterality Date  . ABDOMINAL HYSTERECTOMY    . CHOLECYSTECTOMY    . PANCREATICODUODENECTOMY     [facility].  Neoadj chemoXRT, Whipple 2007-2008    OB History    No data available       Home Medications    Prior to Admission medications   Medication Sig Start Date End Date Taking? Authorizing Provider  acetaminophen (TYLENOL) 500 MG tablet Take 1 tablet (500 mg total) by mouth every 6 (six) hours as needed for moderate pain or headache. 08/14/16    Regalado, Belkys A, MD  albuterol (PROVENTIL HFA;VENTOLIN HFA) 108 (90 Base) MCG/ACT inhaler Inhale 1-2 puffs into the lungs every 6 (six) hours as needed for wheezing or shortness of breath. 07/28/16   Hedges, Dellis Filbert, PA-C  amoxicillin-clavulanate (AUGMENTIN) 875-125 MG tablet Take 1 tablet by mouth 2 (two) times daily. 08/14/16   Regalado, Belkys A, MD  cholecalciferol (VITAMIN D) 1000 units tablet Take 2,000 Units by mouth daily as needed (takes occasionally).     [provider]  citalopram (CELEXA) 10 MG tablet Take 10 mg by mouth daily.    [provider]  Cyanocobalamin (VITAMIN B 12 PO) Take 2,500 mcg by mouth once a week.     [provider]  feeding supplement (BOOST / RESOURCE BREEZE) LIQD Take 1 Container by mouth 3 (three) times daily between meals. 08/14/16   Regalado, Belkys A, MD  iron polysaccharides (NU-IRON) 150 MG capsule Take 1 capsule (150 mg total) by mouth daily. 08/14/16   Regalado, Belkys A, MD  LORazepam (ATIVAN) 1 MG tablet Take 1 mg by mouth See admin instructions. Takes 1 tab daily, and can take 1 addt'l tab as needed for anxiety    [provider]  pantoprazole (PROTONIX) 40 MG tablet Take 1 tablet (40 mg total) by mouth 2 (two) times daily before a meal. 08/14/16   Regalado, Belkys A, MD  polyethylene glycol (MIRALAX / GLYCOLAX) packet Take 17 g by mouth daily. 08/15/16   Regalado, Cassie Freer, MD  saccharomyces boulardii (FLORASTOR)  250 MG capsule Take 1 capsule (250 mg total) by mouth 2 (two) times daily. 08/14/16   Regalado, Cassie Freer, MD    Family History No family history on file.  Social History Social History   Tobacco Use  . Smoking status: Current Every Day Smoker    Packs/day: 1.00    Types: Cigarettes  . Smokeless tobacco: Never Used  Substance Use Topics  . Alcohol use: No  . Drug use: No     Allergies   Aspirin; Codeine-promethazine [promethazine-codeine]; Estrogens; and Phenobarbital   Review of Systems Review  of Systems  Constitutional: Negative for fever.  HENT: Negative for sore throat.   Eyes: Negative for redness.  Respiratory: Positive for cough. Negative for shortness of breath.   Cardiovascular: Negative for chest pain.  Gastrointestinal: Positive for abdominal pain and vomiting. Negative for diarrhea.  Endocrine: Negative for polyuria.  Genitourinary: Negative for dysuria and flank pain.  Musculoskeletal: Negative for back pain and neck pain.  Skin: Negative for rash.  Neurological: Negative for headaches.  Hematological: Does not bruise/bleed easily.  Psychiatric/Behavioral: Negative for confusion.     Physical Exam Updated Vital Signs BP (!) 92/57   Pulse 86   Temp 99.1 F (37.3 C) (Oral)   Resp (!) 24   Ht 1.626 m (5\' 4" )   Wt 42.2 kg (93 lb)   SpO2 96%   BMI 15.96 kg/m   Physical Exam  Constitutional: She appears well-developed and well-nourished. No distress.  HENT:  Mouth/Throat: Oropharynx is clear and moist.  Eyes: Conjunctivae are normal. No scleral icterus.  Neck: Neck supple. No tracheal deviation present.  Cardiovascular: Normal rate, regular rhythm, normal heart sounds and intact distal pulses.  Pulmonary/Chest: Effort normal and breath sounds normal. No respiratory distress.  Abdominal: Soft. Normal appearance and bowel sounds are normal. She exhibits no distension and no mass. There is tenderness. There is no rebound and no guarding. No hernia.  Upper abd tenderness, moderate.   Genitourinary:  Genitourinary Comments: No cva tenderness  Musculoskeletal: She exhibits no edema.  Neurological: She is alert.  Skin: Skin is warm and dry. No rash noted.  Psychiatric: She has a normal mood and affect.  Nursing note and vitals reviewed.    ED Treatments / Results  Labs (all labs ordered are listed, but only abnormal results are displayed) Results for orders placed or performed during the hospital encounter of 05/27/17  Comprehensive metabolic panel    Result Value Ref Range   Sodium 130 (L) 135 - 145 mmol/L   Potassium 3.4 (L) 3.5 - 5.1 mmol/L   Chloride 94 (L) 101 - 111 mmol/L   CO2 24 22 - 32 mmol/L   Glucose, Bld 138 (H) 65 - 99 mg/dL   BUN 13 6 - 20 mg/dL   Creatinine, Ser 0.82 0.44 - 1.00 mg/dL   Calcium 8.4 (L) 8.9 - 10.3 mg/dL   Total Protein 6.1 (L) 6.5 - 8.1 g/dL   Albumin 2.6 (L) 3.5 - 5.0 g/dL   AST 19 15 - 41 U/L   ALT 9 (L) 14 - 54 U/L   Alkaline Phosphatase 96 38 - 126 U/L   Total Bilirubin 0.7 0.3 - 1.2 mg/dL   GFR calc non Af Amer >60 >60 mL/min   GFR calc Af Amer >60 >60 mL/min   Anion gap 12 5 - 15  CBC with Differential  Result Value Ref Range   WBC 17.9 (H) 4.0 - 10.5 K/uL   RBC 3.08 (L)  3.87 - 5.11 MIL/uL   Hemoglobin 9.3 (L) 12.0 - 15.0 g/dL   HCT 27.9 (L) 36.0 - 46.0 %   MCV 90.6 78.0 - 100.0 fL   MCH 30.2 26.0 - 34.0 pg   MCHC 33.3 30.0 - 36.0 g/dL   RDW 12.8 11.5 - 15.5 %   Platelets 265 150 - 400 K/uL   Neutrophils Relative % 88 %   Lymphocytes Relative 4 %   Monocytes Relative 4 %   Eosinophils Relative 0 %   Basophils Relative 0 %   Band Neutrophils 4 %   Metamyelocytes Relative 0 %   Myelocytes 0 %   Promyelocytes Absolute 0 %   Blasts 0 %   nRBC 0 0 /100 WBC   Other 0 %   Neutro Abs 16.5 (H) 1.7 - 7.7 K/uL   Lymphs Abs 0.7 0.7 - 4.0 K/uL   Monocytes Absolute 0.7 0.1 - 1.0 K/uL   Eosinophils Absolute 0.0 0.0 - 0.7 K/uL   Basophils Absolute 0.0 0.0 - 0.1 K/uL  Protime-INR  Result Value Ref Range   Prothrombin Time 13.5 11.4 - 15.2 seconds   INR 1.04   Urinalysis, Routine w reflex microscopic  Result Value Ref Range   Color, Urine YELLOW YELLOW   APPearance HAZY (A) CLEAR   Specific Gravity, Urine 1.025 1.005 - 1.030   pH 5.0 5.0 - 8.0   Glucose, UA NEGATIVE NEGATIVE mg/dL   Hgb urine dipstick NEGATIVE NEGATIVE   Bilirubin Urine NEGATIVE NEGATIVE   Ketones, ur NEGATIVE NEGATIVE mg/dL   Protein, ur 100 (A) NEGATIVE mg/dL   Nitrite NEGATIVE NEGATIVE   Leukocytes, UA NEGATIVE  NEGATIVE   RBC / HPF 0-5 0 - 5 RBC/hpf   WBC, UA 0-5 0 - 5 WBC/hpf   Bacteria, UA NONE SEEN NONE SEEN   Squamous Epithelial / LPF NONE SEEN NONE SEEN   Mucus PRESENT   Lipase, blood  Result Value Ref Range   Lipase 17 11 - 51 U/L  I-Stat CG4 Lactic Acid, ED  Result Value Ref Range   Lactic Acid, Venous 0.84 0.5 - 1.9 mmol/L  I-stat troponin, ED  Result Value Ref Range   Troponin i, poc 0.02 0.00 - 0.08 ng/mL   Comment 3           Dg Chest 2 View  Result Date: 05/27/2017 CLINICAL DATA:  Nausea, vomiting and abdomen pain.  Hypotension. EXAM: CHEST  2 VIEW COMPARISON:  July 28, 2016 FINDINGS: The heart size and mediastinal contours are stable. The heart size is enlarged. There is a hiatal hernia. Patchy consolidation of right lung base is identified. There is no pulmonary edema or pleural effusion. The lungs are hyperinflated. The visualized skeletal structures are stable. IMPRESSION: Right lung base pneumonia. Emphysema. Electronically Signed   By: Abelardo Diesel M.D.   On: 05/27/2017 19:10    EKG  EKG Interpretation None       Radiology No results found.  Procedures Procedures (including critical care time)  Medications Ordered in ED Medications - No data to display   Initial Impression / Assessment and Plan / ED Course  I have reviewed the triage vital signs and the nursing notes.  Pertinent labs & imaging results that were available during my care of the patient were reviewed by me and considered in my medical decision making (see chart for details).  Iv ns bolus.   Reviewed nursing notes and prior charts for additional history.  It appears at baseline  pt with bp in 90-100 range - is lower in ED today. Cultures sent. Ns bolus.   abd pain/tenderness, elevated wbc, nv - will get ct.  cxr does show infiltrate rll, will tx with abx. Iv abx ordered.   Ct remains pending.  Given hypotension, cough, pna on cxr, will require admit - hospitalists consulted for admission.     Post ns boluses, bp improved.   CRITICAL CARE  RE: hypotension, cap, weakness/failure to thrive, Performed by: Mirna Mires Total critical care time: 40 minutes Critical care time was exclusive of separately billable procedures and treating other patients. Critical care was necessary to treat or prevent imminent or life-threatening deterioration. Critical care was time spent personally by me on the following activities: development of treatment plan with patient and/or surrogate as well as nursing, discussions with consultants, evaluation of patient's response to treatment, examination of patient, obtaining history from patient or surrogate, ordering and performing treatments and interventions, ordering and review of laboratory studies, ordering and review of radiographic studies, pulse oximetry and re-evaluation of patient's condition.   Final Clinical Impressions(s) / ED Diagnoses   Final diagnoses:  None    ED Discharge Orders    None       Lajean Saver, MD 05/27/17 2319

## 2017-05-27 NOTE — H&P (Signed)
History and Physical    COPELYN WIDMER NTI:[id number] DOB: [birth year] DOA: 05/27/2017  Referring MD/NP/PA:   PCP: Lavone Orn, MD   Patient coming from:  The patient is coming from home.  At baseline, pt is independent for most of ADL.   Chief Complaint: Cough, shortness breath, nausea, vomiting, abdominal pain  HPI: Mackenzie Mendoza is a 83 y.o. 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  Patient states that her cough started today, the patient has dry cough. Patient also has shortness of breath. She has runny nose, no sore throat. Denies chest pain. Patient also reports nausea, vomiting, abdominal pain, no diarrhea. She states that she vomited once or twice today. She has chronic abdominal pain, today it little worse. Her abdominal pain is located in the mid abdomen, intermittent, moderate, nonradiating. Patient does not have fever or chills. No symptoms of UTI or unilateral weakness. No unilateral weakness.   ED Course: pt was found to have WBC 17.9, lactic acid 0.84, negative urinalysis, potassium 3.4, creatinine normal, temperature 99.1, no tachycardia, has tachypnea, oxygen saturation 94% on room air, hypotension with blood pressure 70/40, which improved to 91/48 after 1.5 normal saline bolus. Chest x-ray showed right lower lobe infiltration. Patient is admitted to telemetry bed as inpatient.  Review of Systems:   General: no fevers, chills,  has poor appetite, has fatigue HEENT: no blurry vision, hearing changes or sore throat Respiratory: has dyspnea, coughing, no wheezing CV: no chest pain, no palpitations GI: has nausea, vomiting, abdominal pain, no diarrhea, constipation GU: no dysuria, burning on urination, increased urinary frequency, hematuria  Ext: no leg  edema Neuro: no unilateral weakness, numbness, or tingling, no vision change or hearing loss Skin: no rash, no skin tear. MSK: No muscle spasm, no deformity, no limitation of range of movement in spin Heme: No easy bruising.  Travel history: No recent long distant travel.  Allergy:  Allergies  Allergen Reactions  . Aspirin Hives  . Codeine-Promethazine [Promethazine-Codeine] Nausea And Vomiting  . Estrogens Hives  . Phenobarbital Itching    Past Medical History:  Diagnosis Date  . Cancer [facility])     Past Surgical History:  Procedure Laterality Date  . ABDOMINAL HYSTERECTOMY    . CHOLECYSTECTOMY    . PANCREATICODUODENECTOMY     [facility].  Neoadj chemoXRT, Whipple 2007-2008    Social History:  reports that she has been smoking cigarettes.  She has been smoking about 1.00 pack per day. she has never used smokeless tobacco. She reports that she does not drink alcohol or use drugs.  Family History:  Family History  Problem Relation Age of Onset  . Lung cancer Brother   . Breast cancer Sister      Prior to Admission medications   Medication Sig Start Date End Date Taking? Authorizing Provider  acetaminophen (TYLENOL) 500 MG tablet Take 1 tablet (500 mg total) by mouth every 6 (six) hours as needed for moderate pain or headache. 08/14/16  Yes Regalado, Belkys A, MD  albuterol (PROVENTIL HFA;VENTOLIN HFA) 108 (90 Base) MCG/ACT inhaler Inhale 1-2 puffs into the lungs every 6 (six) hours as needed for wheezing or shortness of breath. 07/28/16  Yes Hedges, Dellis Filbert, PA-C  cholecalciferol (VITAMIN D) 1000 units tablet Take 2,000 Units by mouth daily.    Yes [provider]  feeding supplement (  BOOST / RESOURCE BREEZE) LIQD Take 1 Container by mouth 3 (three) times daily between meals. Patient taking differently: Take 1 Container by mouth daily.  08/14/16  Yes Regalado, Belkys A, MD  ferrous sulfate 325 (65 FE) MG tablet Take 325 mg by mouth daily with breakfast.   Yes [provider]  LORazepam (ATIVAN) 1 MG tablet Take 1 mg by mouth 2 (two) times daily as needed for anxiety.    Yes [provider]  pantoprazole (PROTONIX) 40 MG tablet Take 1 tablet (40 mg total) by mouth 2 (two) times daily before a meal. Patient taking differently: Take 40 mg by mouth daily.  08/14/16  Yes Regalado, Belkys A, MD  polyethylene glycol (MIRALAX / GLYCOLAX) packet Take 17 g by mouth daily. Patient taking differently: Take 17 g by mouth daily as needed (constipation). Mix in 8 oz liquid and drink 08/15/16  Yes Regalado, Belkys A, MD  Probiotic Product (PROBIOTIC PO) Take 1 tablet by mouth daily.   Yes [provider]  iron polysaccharides (NU-IRON) 150 MG capsule Take 1 capsule (150 mg total) by mouth daily. Patient not taking: Reported on 05/27/2017 08/14/16   Regalado, Jerald Kief A, MD  saccharomyces boulardii (FLORASTOR) 250 MG capsule Take 1 capsule (250 mg total) by mouth 2 (two) times daily. Patient not taking: Reported on 05/27/2017 08/14/16   Elmarie Shiley, MD    Physical Exam: Vitals:   05/27/17 2130 05/27/17 2230 05/27/17 2330 05/28/17 0042  BP: (!) 91/48 92/61 (!) 88/52 (!) 97/51  Pulse: 84 84 74 84  Resp: 18 (!) 27 (!) 24 (!) 27  Temp:      TempSrc:      SpO2: 94% 96% 96% 98%  Weight:      Height:       General: has acute respiratory distress. Cachectic looking. HEENT:       Eyes: PERRL, EOMI, no scleral icterus.       ENT: No discharge from the ears and nose, no pharynx injection, no tonsillar enlargement.        Neck: No JVD, no bruit, no mass felt. Heme: No neck lymph node enlargement. Cardiac: S1/S2, RRR, No murmurs, No gallops or rubs. Respiratory: No rales, wheezing, rhonchi or rubs. GI: Soft, nondistended, mild tenderness in mid abdomen, no rebound pain, no organomegaly, BS present. GU: No hematuria Ext: No pitting leg edema bilaterally. 2+DP/PT pulse bilaterally. Musculoskeletal: No joint deformities, No joint redness or warmth,  no limitation of ROM in spin. Skin: No rashes.  Neuro: Alert, oriented X3, cranial nerves II-XII grossly intact, moves all extremities normally. Psych: Patient is not psychotic, no suicidal or hemocidal ideation.  Labs on Admission: I have personally reviewed following labs and imaging studies  CBC: Recent Labs  Lab 05/27/17 1804  WBC 17.9*  NEUTROABS 16.5*  HGB 9.3*  HCT 27.9*  MCV 90.6  PLT 790   Basic Metabolic Panel: Recent Labs  Lab 05/27/17 1804  NA 130*  K 3.4*  CL 94*  CO2 24  GLUCOSE 138*  BUN 13  CREATININE 0.82  CALCIUM 8.4*   GFR: Estimated Creatinine Clearance: 35.8 mL/min (by C-G formula based on SCr of 0.82 mg/dL). Liver Function Tests: Recent Labs  Lab 05/27/17 1804  AST 19  ALT 9*  ALKPHOS 96  BILITOT 0.7  PROT 6.1*  ALBUMIN 2.6*   Recent Labs  Lab 05/27/17 1804  LIPASE 17   No results for input(s): AMMONIA in the last 168 hours. Coagulation Profile: Recent  Labs  Lab 05/27/17 1804  INR 1.04   Cardiac Enzymes: No results for input(s): CKTOTAL, CKMB, CKMBINDEX, TROPONINI in the last 168 hours. BNP (last 3 results) No results for input(s): PROBNP in the last 8760 hours. HbA1C: No results for input(s): HGBA1C in the last 72 hours. CBG: No results for input(s): GLUCAP in the last 168 hours. Lipid Profile: No results for input(s): CHOL, HDL, LDLCALC, TRIG, CHOLHDL, LDLDIRECT in the last 72 hours. Thyroid Function Tests: No results for input(s): TSH, T4TOTAL, FREET4, T3FREE, THYROIDAB in the last 72 hours. Anemia Panel: No results for input(s): VITAMINB12, FOLATE, FERRITIN, TIBC, IRON, RETICCTPCT in the last 72 hours. Urine analysis:    Component Value Date/Time   COLORURINE YELLOW 05/27/2017 1953   APPEARANCEUR HAZY (A) 05/27/2017 1953   LABSPEC 1.025 05/27/2017 1953   PHURINE 5.0 05/27/2017 1953   GLUCOSEU NEGATIVE 05/27/2017 1953   HGBUR NEGATIVE 05/27/2017 1953   BILIRUBINUR NEGATIVE 05/27/2017 1953   KETONESUR NEGATIVE  05/27/2017 1953   PROTEINUR 100 (A) 05/27/2017 1953   UROBILINOGEN 1.0 08/11/2014 2239   NITRITE NEGATIVE 05/27/2017 1953   LEUKOCYTESUR NEGATIVE 05/27/2017 1953   Sepsis Labs: @LABRCNTIP (procalcitonin:4,lacticidven:4) )No results found for this or any previous visit (from the past 240 hour(s)).   Radiological Exams on Admission: Dg Chest 2 View  Result Date: 05/27/2017 CLINICAL DATA:  Nausea, vomiting and abdomen pain.  Hypotension. EXAM: CHEST  2 VIEW COMPARISON:  July 28, 2016 FINDINGS: The heart size and mediastinal contours are stable. The heart size is enlarged. There is a hiatal hernia. Patchy consolidation of right lung base is identified. There is no pulmonary edema or pleural effusion. The lungs are hyperinflated. The visualized skeletal structures are stable. IMPRESSION: Right lung base pneumonia. Emphysema. Electronically Signed   By: Abelardo Diesel M.D.   On: 05/27/2017 19:10     EKG: Independently reviewed.  Sinus rhythm, QTC 463, low voltage, nonspecific T-wave change.  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 lobar pneumonia and sepsis: Patient meets criteria for sepsis with leukocytosis, hypotension and tachypnea. Lactic acid is pending. Blood pressure responded to IV fluid resuscitation. Currently blood pressure is 91/48. Hemodynamically stable. Mental status normal.  - will admit to tele bed as inpt - IV Rocephin and azithromycin, flagyl - Mucinex for cough  - prn Albuterol Nebs for SOB - Urine legionella and S. pneumococcal antigen - Follow up blood culture x2, sputum culture and respiratory virus panel, plus Flu pcr - will get Procalcitonin and  trend lactic acid level per sepsis protocol - IVF: 1.5 L of NS bolus in ED, followed by 100 mL per hour of NS  -SLP  Hypokalemia: K=3.4 on admission. - Repleted  GERD: -Protonix  Abdominal pain, and nausea & vomiting: this seems to be a chronic issue. It may be repeated to hx of cancer of ampulla of Vater s/p Whipple. -prn Zofran for nausea, and low dose of morphine for pain. -f/u CT-abd/pelvis which is orderd by EDP. -check lipase  Normocytic anemia: hgb stable. 8.7 on 08/13/16-->9.3 today -Follow-up by CBC  Cancer of ampulla of Vater: s/p Whipple 2008 [place] -When necessary Zofran for nausea and morphine for pain  Protein-calorie  malnutrition, severe ([place]) -Nutrition consult    DVT ppx:  SQ Lovenox Code Status: Full code (I discussed with the patient about CODE STATUS, and explained the meaning of CODE STATUS, she said that she is not ready to answer this question. Currently patient is full code). Family Communication: None at bed side.     Disposition Plan:  Anticipate discharge back to previous home environment Consults called:  none Admission status: Inpatient/tele   Date of Service 05/28/2017    Ivor Costa [facility] Pager [phone]  If 7PM-7AM, please contact night-coverage [email or web address] Password [email or web address] 05/28/2017, 1:20 AM

## 2017-05-28 ENCOUNTER — Emergency Department (HOSPITAL_COMMUNITY): Payer: Medicare Other

## 2017-05-28 ENCOUNTER — Encounter (HOSPITAL_COMMUNITY): Payer: Self-pay | Admitting: Internal Medicine

## 2017-05-28 DIAGNOSIS — R112 Nausea with vomiting, unspecified: Secondary | ICD-10-CM | POA: Diagnosis present

## 2017-05-28 DIAGNOSIS — A419 Sepsis, unspecified organism: Secondary | ICD-10-CM | POA: Diagnosis not present

## 2017-05-28 DIAGNOSIS — E860 Dehydration: Secondary | ICD-10-CM | POA: Insufficient documentation

## 2017-05-28 DIAGNOSIS — Z885 Allergy status to narcotic agent status: Secondary | ICD-10-CM | POA: Diagnosis not present

## 2017-05-28 DIAGNOSIS — I959 Hypotension, unspecified: Secondary | ICD-10-CM

## 2017-05-28 DIAGNOSIS — J189 Pneumonia, unspecified organism: Secondary | ICD-10-CM | POA: Diagnosis present

## 2017-05-28 DIAGNOSIS — E876 Hypokalemia: Secondary | ICD-10-CM

## 2017-05-28 DIAGNOSIS — F17210 Nicotine dependence, cigarettes, uncomplicated: Secondary | ICD-10-CM | POA: Diagnosis present

## 2017-05-28 DIAGNOSIS — Z681 Body mass index (BMI) 19 or less, adult: Secondary | ICD-10-CM | POA: Diagnosis not present

## 2017-05-28 DIAGNOSIS — J690 Pneumonitis due to inhalation of food and vomit: Secondary | ICD-10-CM | POA: Diagnosis not present

## 2017-05-28 DIAGNOSIS — K219 Gastro-esophageal reflux disease without esophagitis: Secondary | ICD-10-CM | POA: Diagnosis present

## 2017-05-28 DIAGNOSIS — Z886 Allergy status to analgesic agent status: Secondary | ICD-10-CM | POA: Diagnosis not present

## 2017-05-28 DIAGNOSIS — C241 Malignant neoplasm of ampulla of Vater: Secondary | ICD-10-CM

## 2017-05-28 DIAGNOSIS — E43 Unspecified severe protein-calorie malnutrition: Secondary | ICD-10-CM | POA: Diagnosis not present

## 2017-05-28 DIAGNOSIS — R531 Weakness: Secondary | ICD-10-CM | POA: Diagnosis not present

## 2017-05-28 DIAGNOSIS — Z79899 Other long term (current) drug therapy: Secondary | ICD-10-CM | POA: Diagnosis not present

## 2017-05-28 DIAGNOSIS — I9589 Other hypotension: Secondary | ICD-10-CM | POA: Diagnosis not present

## 2017-05-28 DIAGNOSIS — J9601 Acute respiratory failure with hypoxia: Secondary | ICD-10-CM | POA: Diagnosis not present

## 2017-05-28 DIAGNOSIS — K449 Diaphragmatic hernia without obstruction or gangrene: Secondary | ICD-10-CM | POA: Diagnosis not present

## 2017-05-28 DIAGNOSIS — Z720 Tobacco use: Secondary | ICD-10-CM | POA: Diagnosis not present

## 2017-05-28 DIAGNOSIS — J181 Lobar pneumonia, unspecified organism: Secondary | ICD-10-CM | POA: Diagnosis not present

## 2017-05-28 LAB — LACTIC ACID, PLASMA
LACTIC ACID, VENOUS: 1.2 mmol/L (ref 0.5–1.9)
LACTIC ACID, VENOUS: 1.7 mmol/L (ref 0.5–1.9)

## 2017-05-28 LAB — RESPIRATORY PANEL BY PCR
Adenovirus: NOT DETECTED
BORDETELLA PERTUSSIS-RVPCR: NOT DETECTED
CORONAVIRUS 229E-RVPPCR: NOT DETECTED
CORONAVIRUS OC43-RVPPCR: NOT DETECTED
Chlamydophila pneumoniae: NOT DETECTED
Coronavirus HKU1: NOT DETECTED
Coronavirus NL63: NOT DETECTED
INFLUENZA A-RVPPCR: NOT DETECTED
INFLUENZA B-RVPPCR: NOT DETECTED
METAPNEUMOVIRUS-RVPPCR: NOT DETECTED
Mycoplasma pneumoniae: NOT DETECTED
PARAINFLUENZA VIRUS 1-RVPPCR: NOT DETECTED
PARAINFLUENZA VIRUS 2-RVPPCR: NOT DETECTED
PARAINFLUENZA VIRUS 4-RVPPCR: NOT DETECTED
Parainfluenza Virus 3: NOT DETECTED
RESPIRATORY SYNCYTIAL VIRUS-RVPPCR: NOT DETECTED
Rhinovirus / Enterovirus: NOT DETECTED

## 2017-05-28 LAB — BASIC METABOLIC PANEL
Anion gap: 5 (ref 5–15)
BUN: 7 mg/dL (ref 6–20)
CALCIUM: 7.3 mg/dL — AB (ref 8.9–10.3)
CO2: 23 mmol/L (ref 22–32)
CREATININE: 0.67 mg/dL (ref 0.44–1.00)
Chloride: 103 mmol/L (ref 101–111)
GFR calc Af Amer: >60 mL/min (ref >60)
GLUCOSE: 103 mg/dL — AB (ref 65–99)
Potassium: 3.6 mmol/L (ref 3.5–5.1)
Sodium: 131 mmol/L — ABNORMAL LOW (ref 135–145)

## 2017-05-28 LAB — CBC
HCT: 24.5 % — ABNORMAL LOW (ref 36.0–46.0)
Hemoglobin: 8 g/dL — ABNORMAL LOW (ref 12.0–15.0)
MCH: 30.4 pg (ref 26.0–34.0)
MCHC: 32.7 g/dL (ref 30.0–36.0)
MCV: 93.2 fL (ref 78.0–100.0)
Platelets: 172 10*3/uL (ref 150–400)
RBC: 2.63 MIL/uL — ABNORMAL LOW (ref 3.87–5.11)
RDW: 13.1 % (ref 11.5–15.5)
WBC: 10.4 10*3/uL (ref 4.0–10.5)

## 2017-05-28 LAB — INFLUENZA PANEL BY PCR (TYPE A & B)
INFLAPCR: NEGATIVE
Influenza B By PCR: NEGATIVE

## 2017-05-28 LAB — STREP PNEUMONIAE URINARY ANTIGEN: STREP PNEUMO URINARY ANTIGEN: NEGATIVE

## 2017-05-28 LAB — PROCALCITONIN: Procalcitonin: 0.22 ng/mL

## 2017-05-28 LAB — CORTISOL-AM, BLOOD: Cortisol - AM: 20.5 ug/dL (ref 6.7–22.6)

## 2017-05-28 MED ORDER — POLYETHYLENE GLYCOL 3350 17 G PO PACK
17.0000 g | PACK | Freq: Every day | ORAL | Status: DC
  Administered 2017-05-29 – 2017-05-30 (×2): 17 g via ORAL
  Filled 2017-05-28 (×2): qty 1

## 2017-05-28 MED ORDER — POTASSIUM CHLORIDE 20 MEQ/15ML (10%) PO SOLN
20.0000 meq | Freq: Once | ORAL | Status: AC
  Administered 2017-05-28: 20 meq via ORAL
  Filled 2017-05-28: qty 15

## 2017-05-28 MED ORDER — FERROUS SULFATE 325 (65 FE) MG PO TABS
325.0000 mg | ORAL_TABLET | Freq: Every day | ORAL | Status: DC
  Administered 2017-05-28 – 2017-05-30 (×3): 325 mg via ORAL
  Filled 2017-05-28 (×3): qty 1

## 2017-05-28 MED ORDER — DEXTROSE 5 % IV SOLN
500.0000 mg | INTRAVENOUS | Status: DC
  Administered 2017-05-29 (×2): 500 mg via INTRAVENOUS
  Filled 2017-05-28 (×2): qty 500

## 2017-05-28 MED ORDER — SODIUM CHLORIDE 0.9 % IV BOLUS (SEPSIS)
500.0000 mL | Freq: Once | INTRAVENOUS | Status: AC
  Administered 2017-05-28: 500 mL via INTRAVENOUS

## 2017-05-28 MED ORDER — MORPHINE SULFATE (PF) 4 MG/ML IV SOLN: 0.5000 mg | INTRAVENOUS | Status: DC | PRN

## 2017-05-28 MED ORDER — DM-GUAIFENESIN ER 30-600 MG PO TB12
1.0000 | ORAL_TABLET | Freq: Two times a day (BID) | ORAL | Status: DC | PRN
  Administered 2017-05-29: 1 via ORAL
  Filled 2017-05-28: qty 1

## 2017-05-28 MED ORDER — RISAQUAD PO CAPS
1.0000 | ORAL_CAPSULE | Freq: Every day | ORAL | Status: DC
  Administered 2017-05-28 – 2017-05-30 (×3): 1 via ORAL
  Filled 2017-05-28 (×3): qty 1

## 2017-05-28 MED ORDER — SODIUM CHLORIDE 0.9 % IV SOLN
INTRAVENOUS | Status: DC
  Administered 2017-05-28 – 2017-05-29 (×3): via INTRAVENOUS
  Administered 2017-05-30: 1000 mL via INTRAVENOUS

## 2017-05-28 MED ORDER — BOOST / RESOURCE BREEZE PO LIQD CUSTOM
1.0000 | Freq: Three times a day (TID) | ORAL | Status: DC
  Administered 2017-05-28 – 2017-05-30 (×6): 1 via ORAL
  Filled 2017-05-28: qty 1

## 2017-05-28 MED ORDER — ACETAMINOPHEN 325 MG PO TABS: 325.0000 mg | ORAL_TABLET | Freq: Four times a day (QID) | ORAL | Status: DC | PRN

## 2017-05-28 MED ORDER — PANTOPRAZOLE SODIUM 40 MG PO TBEC
40.0000 mg | DELAYED_RELEASE_TABLET | Freq: Every day | ORAL | Status: DC
  Administered 2017-05-29 – 2017-05-30 (×2): 40 mg via ORAL
  Filled 2017-05-28 (×2): qty 1

## 2017-05-28 MED ORDER — LORAZEPAM 0.5 MG PO TABS: 0.5000 mg | ORAL_TABLET | Freq: Two times a day (BID) | ORAL | Status: DC | PRN

## 2017-05-28 MED ORDER — NICOTINE 21 MG/24HR TD PT24
21.0000 mg | MEDICATED_PATCH | Freq: Every day | TRANSDERMAL | Status: DC
  Administered 2017-05-28 – 2017-05-30 (×3): 21 mg via TRANSDERMAL
  Filled 2017-05-28 (×3): qty 1

## 2017-05-28 MED ORDER — VITAMIN D 1000 UNITS PO TABS
2000.0000 [IU] | ORAL_TABLET | Freq: Every day | ORAL | Status: DC
  Administered 2017-05-29 – 2017-05-30 (×2): 2000 [IU] via ORAL
  Filled 2017-05-28 (×2): qty 2

## 2017-05-28 MED ORDER — ZOLPIDEM TARTRATE 5 MG PO TABS: 5.0000 mg | ORAL_TABLET | Freq: Every evening | ORAL | Status: DC | PRN

## 2017-05-28 MED ORDER — ALBUTEROL SULFATE (2.5 MG/3ML) 0.083% IN NEBU: 2.5000 mg | INHALATION_SOLUTION | RESPIRATORY_TRACT | Status: DC | PRN

## 2017-05-28 MED ORDER — PANTOPRAZOLE SODIUM 40 MG PO TBEC: 40.0000 mg | DELAYED_RELEASE_TABLET | Freq: Two times a day (BID) | ORAL | Status: DC

## 2017-05-28 MED ORDER — NICOTINE 21 MG/24HR TD PT24: 21.0000 mg | MEDICATED_PATCH | Freq: Every day | TRANSDERMAL | Status: DC

## 2017-05-28 MED ORDER — INFLUENZA VAC SPLIT HIGH-DOSE 0.5 ML IM SUSY
0.5000 mL | PREFILLED_SYRINGE | INTRAMUSCULAR | Status: DC
  Filled 2017-05-28: qty 0.5

## 2017-05-28 MED ORDER — ENOXAPARIN SODIUM 300 MG/3ML IJ SOLN
20.0000 mg | INTRAMUSCULAR | Status: DC
  Administered 2017-05-28 – 2017-05-30 (×3): 20 mg via SUBCUTANEOUS
  Filled 2017-05-28 (×3): qty 0.2

## 2017-05-28 MED ORDER — DEXTROSE 5 % IV SOLN
1.0000 g | INTRAVENOUS | Status: DC
  Administered 2017-05-28 – 2017-05-29 (×2): 1 g via INTRAVENOUS
  Filled 2017-05-28 (×2): qty 10

## 2017-05-28 MED ORDER — LORAZEPAM 1 MG PO TABS: 1.0000 mg | ORAL_TABLET | Freq: Two times a day (BID) | ORAL | Status: DC | PRN

## 2017-05-28 MED ORDER — METRONIDAZOLE IN NACL 5-0.79 MG/ML-% IV SOLN
500.0000 mg | Freq: Three times a day (TID) | INTRAVENOUS | Status: DC
  Administered 2017-05-28 – 2017-05-30 (×7): 500 mg via INTRAVENOUS
  Filled 2017-05-28 (×7): qty 100

## 2017-05-28 MED ORDER — PROBIOTIC PO CAPS: ORAL_CAPSULE | Freq: Every day | ORAL | Status: DC

## 2017-05-28 NOTE — ED Notes (Signed)
Pt placed in hospital bed for comfort.

## 2017-05-28 NOTE — ED Notes (Signed)
MD paged a 3rd time.

## 2017-05-28 NOTE — ED Notes (Signed)
Admitting physician at bedside to re assess since recent hypotension. Pt is alert and ox4, pt has no complaints.

## 2017-05-28 NOTE — Progress Notes (Addendum)
PROGRESS NOTE    Mackenzie Mendoza   ERX:[id number]  DOB: 16-Feb-1934  DOA: 05/27/2017 PCP: Lavone Orn, MD   Brief Narrative:   Mackenzie Mendoza is a 83 y.o. 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 she is admitted for a RLL infiltrate, leukocytosis and hypotension.     Subjective: Mild cough. No dyspnea or chest pain.  ROS: no complaints of nausea, vomiting, constipation diarrhea, cough, dyspnea or dysuria. No other complaints.   Assessment & Plan:   Principal Problem: CAP/ leukocytosis/ hypotension- sepsis - Rocephin and Zithromax - con to follow   Active Problems: Hypotension - change level of care to SDU as SBP  In 70s - has received numerous fluid boluses - BP is quite low but Lactic acid improved and patient is quite alert and oriented - cont to follow    Cancer of ampulla of Vater: s/p Whipple 2008 [place] -When necessary Zofran for nausea and morphine for pain  Protein-calorie malnutrition, severe ([place]) -Nutrition consult     DVT prophylaxis: Lovnenox Code Status: Full code Family Communication:  Disposition Plan: home when stable Consultants:   none Procedures:   none Antimicrobials:  Anti-infectives (From admission, onward)   Start     Dose/Rate Route Frequency Ordered Stop   05/29/17 0000  cefTRIAXone (ROCEPHIN) 1 g in dextrose 5 % 50 mL IVPB     1 g 100 mL/hr over 30 Minutes Intravenous Every 24 hours 05/28/17 0006 06/04/17 2359   05/29/17 0000  azithromycin (ZITHROMAX) 500 mg in dextrose 5 % 250 mL IVPB     500 mg 250 mL/hr over 60 Minutes Intravenous Every 24 hours 05/28/17 0006 06/04/17 2359   05/28/17 0030  metroNIDAZOLE (FLAGYL) IVPB 500 mg     500 mg 100 mL/hr over 60 Minutes Intravenous Every 8 hours 05/28/17 0003     05/27/17 2330  cefTRIAXone (ROCEPHIN) 1 g in dextrose 5 % 50 mL IVPB     1 g 100 mL/hr over 30 Minutes Intravenous  Once 05/27/17 2316 05/28/17 0151   05/27/17 2330  azithromycin (ZITHROMAX) 500 mg in dextrose 5 % 250 mL IVPB     500 mg 250 mL/hr over 60 Minutes Intravenous  Once 05/27/17 2316 05/28/17 0313       Objective: Vitals:   05/28/17 1600 05/28/17 1645 05/28/17 1745 05/28/17 1810  BP: (!) 82/47 (!) 80/57 (!) 88/48 (!) 89/42  Pulse: 75 72 77 78  Resp: (!) 28 (!) 26 (!) 28 (!) 23  Temp:    98.5 F (36.9 C)  TempSrc:    Oral  SpO2: 96% 99% 100% 100%  Weight:    41.3 kg (91 lb)  Height:    5\' 4"  (1.626 m)    Intake/Output Summary (Last 24 hours) at 05/28/2017 1840 Last data filed at 05/28/2017 0444 Gross per 24 hour  Intake 2450 ml  Output -  Net 2450 ml   Filed Weights   05/27/17 1730 05/28/17 1810  Weight: 42.2 kg (93 lb) 41.3 kg (91 lb)    Examination: General exam: Appears comfortable  HEENT: PERRLA, oral mucosa moist, no sclera icterus or thrush Respiratory system: coarse crackles in RLL Cardiovascular system: S1 & S2 heard, RRR.  No murmurs  Gastrointestinal system: Abdomen soft, non-tender, nondistended. Normal bowel sound. No organomegaly Central nervous system: Alert and oriented.  No focal neurological deficits. Extremities: No cyanosis, clubbing or edema Skin: No rashes or ulcers Psychiatry:  Mood & affect appropriate.     Data Reviewed: I have personally reviewed following labs and imaging studies  CBC: Recent Labs  Lab 05/27/17 1804 05/28/17 0829  WBC 17.9* 10.4  NEUTROABS 16.5*  --   HGB 9.3* 8.0*  HCT 27.9* 24.5*  MCV 90.6 93.2  PLT 265 366   Basic Metabolic Panel: Recent Labs  Lab 05/27/17 1804 05/28/17 0829  NA 130* 131*  K 3.4* 3.6  CL 94* 103  CO2 24 23  GLUCOSE 138* 103*  BUN 13 7  CREATININE 0.82 0.67  CALCIUM 8.4* 7.3*   GFR: Estimated Creatinine Clearance: 36 mL/min (by C-G formula based on SCr of 0.67  mg/dL). Liver Function Tests: Recent Labs  Lab 05/27/17 1804  AST 19  ALT 9*  ALKPHOS 96  BILITOT 0.7  PROT 6.1*  ALBUMIN 2.6*   Recent Labs  Lab 05/27/17 1804  LIPASE 17   No results for input(s): AMMONIA in the last 168 hours. Coagulation Profile: Recent Labs  Lab 05/27/17 1804  INR 1.04   Cardiac Enzymes: No results for input(s): CKTOTAL, CKMB, CKMBINDEX, TROPONINI in the last 168 hours. BNP (last 3 results) No results for input(s): PROBNP in the last 8760 hours. HbA1C: No results for input(s): HGBA1C in the last 72 hours. CBG: No results for input(s): GLUCAP in the last 168 hours. Lipid Profile: No results for input(s): CHOL, HDL, LDLCALC, TRIG, CHOLHDL, LDLDIRECT in the last 72 hours. Thyroid Function Tests: No results for input(s): TSH, T4TOTAL, FREET4, T3FREE, THYROIDAB in the last 72 hours. Anemia Panel: No results for input(s): VITAMINB12, FOLATE, FERRITIN, TIBC, IRON, RETICCTPCT in the last 72 hours. Urine analysis:    Component Value Date/Time   COLORURINE YELLOW 05/27/2017 1953   APPEARANCEUR HAZY (A) 05/27/2017 1953   LABSPEC 1.025 05/27/2017 1953   PHURINE 5.0 05/27/2017 1953   GLUCOSEU NEGATIVE 05/27/2017 1953   HGBUR NEGATIVE 05/27/2017 1953   BILIRUBINUR NEGATIVE 05/27/2017 1953   KETONESUR NEGATIVE 05/27/2017 1953   PROTEINUR 100 (A) 05/27/2017 1953   UROBILINOGEN 1.0 08/11/2014 2239   NITRITE NEGATIVE 05/27/2017 1953   LEUKOCYTESUR NEGATIVE 05/27/2017 1953   Sepsis Labs: @LABRCNTIP (procalcitonin:4,lacticidven:4) ) Recent Results (from the past 240 hour(s))  Culture, blood (Routine x 2)     Status: None (Preliminary result)   Collection Time: 05/27/17  6:05 PM  Result Value Ref Range Status   Specimen Description BLOOD LEFT ANTECUBITAL  Final   Special Requests   Final    BOTTLES DRAWN AEROBIC AND ANAEROBIC Blood Culture adequate volume   Culture NO GROWTH < 24 HOURS  Final   Report Status PENDING  Incomplete  Culture, blood (Routine  x 2)     Status: None (Preliminary result)   Collection Time: 05/27/17  6:25 PM  Result Value Ref Range Status   Specimen Description BLOOD BLOOD RIGHT FOREARM  Final   Special Requests IN PEDIATRIC BOTTLE Blood Culture adequate volume  Final   Culture NO GROWTH < 24 HOURS  Final   Report Status PENDING  Incomplete  Respiratory Panel by PCR     Status: None   Collection Time: 05/28/17  9:28 AM  Result Value Ref Range Status   Adenovirus NOT DETECTED NOT DETECTED Final   Coronavirus 229E NOT DETECTED NOT DETECTED Final   Coronavirus HKU1 NOT DETECTED NOT DETECTED Final   Coronavirus NL63 NOT DETECTED NOT DETECTED Final   Coronavirus  OC43 NOT DETECTED NOT DETECTED Final   Metapneumovirus NOT DETECTED NOT DETECTED Final   Rhinovirus / Enterovirus NOT DETECTED NOT DETECTED Final   Influenza A NOT DETECTED NOT DETECTED Final   Influenza B NOT DETECTED NOT DETECTED Final   Parainfluenza Virus 1 NOT DETECTED NOT DETECTED Final   Parainfluenza Virus 2 NOT DETECTED NOT DETECTED Final   Parainfluenza Virus 3 NOT DETECTED NOT DETECTED Final   Parainfluenza Virus 4 NOT DETECTED NOT DETECTED Final   Respiratory Syncytial Virus NOT DETECTED NOT DETECTED Final   Bordetella pertussis NOT DETECTED NOT DETECTED Final   Chlamydophila pneumoniae NOT DETECTED NOT DETECTED Final   Mycoplasma pneumoniae NOT DETECTED NOT DETECTED Final         Radiology Studies: Dg Chest 2 View  Result Date: 05/27/2017 CLINICAL DATA:  Nausea, vomiting and abdomen pain.  Hypotension. EXAM: CHEST  2 VIEW COMPARISON:  July 28, 2016 FINDINGS: The heart size and mediastinal contours are stable. The heart size is enlarged. There is a hiatal hernia. Patchy consolidation of right lung base is identified. There is no pulmonary edema or pleural effusion. The lungs are hyperinflated. The visualized skeletal structures are stable. IMPRESSION: Right lung base pneumonia. Emphysema. Electronically Signed   By: Abelardo Diesel M.D.   On:  05/27/2017 19:10   Ct Abdomen Pelvis W Contrast  Result Date: 05/28/2017 CLINICAL DATA:  83 y/o F; nausea, poor appetite, not eating or drinking for 2-3 days. History of bile duct tumor status post Whipple in 2008. Subjective fever. EXAM: CT ABDOMEN AND PELVIS WITH CONTRAST TECHNIQUE: Multidetector CT imaging of the abdomen and pelvis was performed using the standard protocol following bolus administration of intravenous contrast. CONTRAST:  160mL ISOVUE-300 IOPAMIDOL (ISOVUE-300) INJECTION 61% COMPARISON:  08/09/2016 CT abdomen and pelvis. FINDINGS: Lower chest: Bibasilar clustered nodules in bronchovascular distribution compatible with bronchiolitis. Large hiatal hernia. Hepatobiliary: No focal liver abnormality is seen. Status post cholecystectomy. No biliary dilatation. Pancreas: Status post Whipple procedure with resection of head and tail of pancreas. Body and tail of pancreas are unremarkable. Spleen: Normal in size without focal abnormality. Adrenals/Urinary Tract: Normal adrenal glands. A large stable right kidney upper pole cyst. No additional kidney lesion identified. Normal ureters. Normal bladder. Stomach/Bowel: Stomach is within normal limits. Appendix not identified. No evidence of bowel wall thickening, distention, or inflammatory changes. Vascular/Lymphatic: Aortic atherosclerosis. No enlarged abdominal or pelvic lymph nodes. Reproductive: Status post hysterectomy. No adnexal masses. Other: No abdominal wall hernia or abnormality. No abdominopelvic ascites. Musculoskeletal: Stable severe T12 anterior compression deformity post kyphoplasty. Stable L2 moderate compression deformity post kyphoplasty. Stable mild L3 compression deformity. No acute fracture identified. IMPRESSION: 1. Progressed bibasilar clustered nodular opacities compatible with acute bronchiolitis, possibly due to aspiration given distribution. 2. Stable large hiatal hernia. 3. Multiple compression deformities post kyphoplasty at  T12 and L2 are stable. 4. Aortic atherosclerosis. 5. Otherwise stable CT of abdomen and pelvis. Electronically Signed   By: Kristine Garbe M.D.   On: 05/28/2017 01:20      Scheduled Meds: . acidophilus  1 capsule Oral Daily  . cholecalciferol  2,000 Units Oral Daily  . enoxaparin (LOVENOX) injection  20 mg Subcutaneous Q24H  . feeding supplement  1 Container Oral TID BM  . ferrous sulfate  325 mg Oral Q breakfast  . [START ON 05/29/2017] Influenza vac split quadrivalent PF  0.5 mL Intramuscular Tomorrow-1000  . nicotine  21 mg Transdermal Daily  . pantoprazole  40 mg Oral Daily  . polyethylene glycol  17 g Oral Daily   Continuous Infusions: . sodium chloride 100 mL/hr at 05/28/17 1742  . [START ON 05/29/2017] azithromycin    . [START ON 05/29/2017] cefTRIAXone (ROCEPHIN)  IV    . metronidazole Stopped (05/28/17 1334)     LOS: 0 days    Time spent in minutes: 35    Debbe Odea, MD [facility] Pager: [email or web address] Password [facility] 05/28/2017, 6:40 PM

## 2017-05-28 NOTE — ED Notes (Signed)
Admitting MD paged again  

## 2017-05-28 NOTE — ED Notes (Signed)
Pt has bed on 5W however blood pressure remains 78/44. Paged admitting MD.

## 2017-05-29 DIAGNOSIS — E43 Unspecified severe protein-calorie malnutrition: Secondary | ICD-10-CM

## 2017-05-29 DIAGNOSIS — J690 Pneumonitis due to inhalation of food and vomit: Secondary | ICD-10-CM

## 2017-05-29 DIAGNOSIS — A419 Sepsis, unspecified organism: Principal | ICD-10-CM

## 2017-05-29 LAB — LEGIONELLA PNEUMOPHILA SEROGP 1 UR AG: L. PNEUMOPHILA SEROGP 1 UR AG: NEGATIVE

## 2017-05-29 MED ORDER — DM-GUAIFENESIN ER 30-600 MG PO TB12
1.0000 | ORAL_TABLET | Freq: Two times a day (BID) | ORAL | Status: DC
  Administered 2017-05-29 – 2017-05-30 (×3): 1 via ORAL
  Filled 2017-05-29 (×3): qty 1

## 2017-05-29 NOTE — Evaluation (Signed)
Clinical/Bedside Swallow Evaluation Patient Details  Name: LAURALI GODDARD MRN: [id number] Date of Birth: 1933/12/21  Today's Date: 05/29/2017 Time: SLP Start Time (ACUTE ONLY): 1004 SLP Stop Time (ACUTE ONLY): 1023 SLP Time Calculation (min) (ACUTE ONLY): 19 min  Past Medical History:  Past Medical History:  Diagnosis Date  . Cancer [facility])    Past Surgical History:  Past Surgical History:  Procedure Laterality Date  . ABDOMINAL HYSTERECTOMY    . CHOLECYSTECTOMY    . PANCREATICODUODENECTOMY     [facility].  Neoadj chemoXRT, Whipple 2007-2008   HPI:  PERLINE AWE a 84 y.o.femalewith medical history significant ofcancer of the ampulla of Vater(s/p of Whipple procedure),GERD, large hiatal hernia, iron deficiency anemia, tobacco abuse, chronic abdominal pain, who presents with cough, shortness breath, nausea, vomiting and abdominal pain. CXR 12/30 right lung base pna. Also found to have, leukocytosis and hypotension.    Assessment / Plan / Recommendation Clinical Impression  Pt is cachectic with son at bedside stating she eats very very little at home with husband and daughter "who are in poor health." Appeared as if going to cough after first sip thin but did not and subsequent sips appeared functional. No significant increased work of breathing; explained relationship of COPD and swallow (increasing aspiration risk) as well as extensive history of GI issues. Discussed various textures and agreed upon Dys 3 due to missing dentition, continue thin. SLP encouraged her to try to eat even when not hungry and select most nutritious items possible. No further ST needed.       SLP Visit Diagnosis: Dysphagia, unspecified (R13.10)    Aspiration Risk  Mild aspiration risk    Diet Recommendation Dysphagia 3 (Mech soft);Thin liquid   Liquid Administration via: Cup;Straw Medication Administration: Whole meds with liquid Supervision: Patient able to self feed Compensations: Small  sips/bites;Slow rate Postural Changes: Seated upright at 90 degrees;Remain upright for at least 30 minutes after po intake    Other  Recommendations Oral Care Recommendations: Oral care BID   Follow up Recommendations None      Frequency and Duration            Prognosis        Swallow Study   General HPI: VINCIE LINN a 84 y.o.femalewith medical history significant ofcancer of the ampulla of Vater(s/p of Whipple procedure),GERD, large hiatal hernia, iron deficiency anemia, tobacco abuse, chronic abdominal pain, who presents with cough, shortness breath, nausea, vomiting and abdominal pain. CXR 12/30 right lung base pna. Also found to have, leukocytosis and hypotension.  Type of Study: Bedside Swallow Evaluation Previous Swallow Assessment: (none) Diet Prior to this Study: Regular;Thin liquids Temperature Spikes Noted: No Respiratory Status: Room air History of Recent Intubation: No Behavior/Cognition: Alert;Cooperative Oral Cavity Assessment: Within Functional Limits Oral Care Completed by SLP: No Oral Cavity - Dentition: Poor condition;Missing dentition Vision: Functional for self-feeding Self-Feeding Abilities: Able to feed self Patient Positioning: Upright in bed Baseline Vocal Quality: Normal Volitional Cough: Weak Volitional Swallow: Able to elicit    Oral/Motor/Sensory Function Overall Oral Motor/Sensory Function: Within functional limits   Ice Chips Ice chips: Not tested   Thin Liquid Thin Liquid: Within functional limits Presentation: Cup;Straw    Nectar Thick Nectar Thick Liquid: Not tested   Honey Thick Honey Thick Liquid: Not tested   Puree Puree: Within functional limits   Solid   GO   Solid: Within functional limits        Draya Felker, Orbie Pyo 05/29/2017,10:35 AM  Orbie Pyo Mick Sell  M.Ed CCC-SLP Pager [phone]

## 2017-05-29 NOTE — Progress Notes (Signed)
Notified by CCMD that patient is having trigeminy PVC.

## 2017-05-29 NOTE — Progress Notes (Addendum)
PROGRESS NOTE    Mackenzie Mendoza   URK:[id number]  DOB: Oct 23, 1933  DOA: 05/27/2017 PCP: Lavone Orn, MD   Brief Narrative:   Mackenzie Mendoza is a 84 y.o. 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 She is admitted for a RLL infiltrate, leukocytosis and hypotension.    Subjective: Cough is severe today. No dyspnea. Not much sputum.  ROS: no complaints of nausea, vomiting, constipation diarrhea, cough, dyspnea or dysuria. No other complaints.   Assessment & Plan:   Principal Problem: CAP/ leukocytosis/ hypotension- sepsis - Rocephin and Zithromax- Flagyl was started for possible aspiration in relation to vomiting - con to follow  - add cough suppressant today - resp virus panel negative  Active Problems: Hypotension - in ER, SBP  In 70s- SBP in 90s today numerous fluid boluses - BP is quite low but Lactic acid improved and patient is quite alert and oriented - cont to follow    Cancer of ampulla of Vater: s/p Whipple 2008 [place] -When necessary Zofran for nausea and morphine for pain  Protein-calorie malnutrition, severe ([place]) -Nutrition consult  DVT prophylaxis: Lovnenox Code Status: Full code Family Communication:  Disposition Plan: home when stable Consultants:   none Procedures:   none Antimicrobials:  Anti-infectives (From admission, onward)   Start     Dose/Rate Route Frequency Ordered Stop   05/29/17 0000  cefTRIAXone (ROCEPHIN) 1 g in dextrose 5 % 50 mL IVPB     1 g 100 mL/hr over 30 Minutes Intravenous Every 24 hours 05/28/17 0006 06/04/17 2359   05/29/17 0000  azithromycin (ZITHROMAX) 500 mg in dextrose 5 % 250 mL IVPB     500 mg 250 mL/hr over 60 Minutes Intravenous Every 24 hours 05/28/17 0006 06/04/17 2359   05/28/17 0030  metroNIDAZOLE  (FLAGYL) IVPB 500 mg     500 mg 100 mL/hr over 60 Minutes Intravenous Every 8 hours 05/28/17 0003     05/27/17 2330  cefTRIAXone (ROCEPHIN) 1 g in dextrose 5 % 50 mL IVPB     1 g 100 mL/hr over 30 Minutes Intravenous  Once 05/27/17 2316 05/28/17 0151   05/27/17 2330  azithromycin (ZITHROMAX) 500 mg in dextrose 5 % 250 mL IVPB     500 mg 250 mL/hr over 60 Minutes Intravenous  Once 05/27/17 2316 05/28/17 0313       Objective: Vitals:   05/28/17 2324 05/29/17 0319 05/29/17 0330 05/29/17 0802  BP: (!) 85/47  (!) 93/49 (!) 91/58  Pulse:   80 86  Resp: 20  (!) 26   Temp: 99.7 F (37.6 C)  98.7 F (37.1 C) 98.1 F (36.7 C)  TempSrc: Oral  Oral Oral  SpO2: 98%  96%   Weight:  44.1 kg (97 lb 3.2 oz)    Height:  5\' 4"  (1.626 m)      Intake/Output Summary (Last 24 hours) at 05/29/2017 1133 Last data filed at 05/29/2017 0900 Gross per 24 hour  Intake 1660 ml  Output 750 ml  Net 910 ml   Filed Weights   05/27/17 1730 05/28/17 1810 05/29/17 0319  Weight: 42.2 kg (93 lb) 41.3 kg (91 lb) 44.1 kg (97 lb 3.2 oz)    Examination: General exam: Appears comfortable  HEENT: PERRLA, oral mucosa moist, no sclera icterus or thrush Respiratory system: coarse crackles in RLL  Cardiovascular system: S1 & S2 heard, RRR.  No murmurs  Gastrointestinal system: Abdomen soft, non-tender, nondistended. Normal bowel sound. No organomegaly Central nervous system: Alert and oriented. No focal neurological deficits. Extremities: No cyanosis, clubbing or edema Skin: No rashes or ulcers Psychiatry:  Mood & affect appropriate.     Data Reviewed: I have personally reviewed following labs and imaging studies  CBC: Recent Labs  Lab 05/27/17 1804 05/28/17 0829  WBC 17.9* 10.4  NEUTROABS 16.5*  --   HGB 9.3* 8.0*  HCT 27.9* 24.5*  MCV 90.6 93.2  PLT 265 161   Basic Metabolic Panel: Recent Labs  Lab 05/27/17 1804 05/28/17 0829  NA 130* 131*  K 3.4* 3.6  CL 94* 103  CO2 24 23  GLUCOSE 138* 103*   BUN 13 7  CREATININE 0.82 0.67  CALCIUM 8.4* 7.3*   GFR: Estimated Creatinine Clearance: 38.4 mL/min (by C-G formula based on SCr of 0.67 mg/dL). Liver Function Tests: Recent Labs  Lab 05/27/17 1804  AST 19  ALT 9*  ALKPHOS 96  BILITOT 0.7  PROT 6.1*  ALBUMIN 2.6*   Recent Labs  Lab 05/27/17 1804  LIPASE 17   No results for input(s): AMMONIA in the last 168 hours. Coagulation Profile: Recent Labs  Lab 05/27/17 1804  INR 1.04   Cardiac Enzymes: No results for input(s): CKTOTAL, CKMB, CKMBINDEX, TROPONINI in the last 168 hours. BNP (last 3 results) No results for input(s): PROBNP in the last 8760 hours. HbA1C: No results for input(s): HGBA1C in the last 72 hours. CBG: No results for input(s): GLUCAP in the last 168 hours. Lipid Profile: No results for input(s): CHOL, HDL, LDLCALC, TRIG, CHOLHDL, LDLDIRECT in the last 72 hours. Thyroid Function Tests: No results for input(s): TSH, T4TOTAL, FREET4, T3FREE, THYROIDAB in the last 72 hours. Anemia Panel: No results for input(s): VITAMINB12, FOLATE, FERRITIN, TIBC, IRON, RETICCTPCT in the last 72 hours. Urine analysis:    Component Value Date/Time   COLORURINE YELLOW 05/27/2017 1953   APPEARANCEUR HAZY (A) 05/27/2017 1953   LABSPEC 1.025 05/27/2017 1953   PHURINE 5.0 05/27/2017 1953   GLUCOSEU NEGATIVE 05/27/2017 1953   HGBUR NEGATIVE 05/27/2017 1953   BILIRUBINUR NEGATIVE 05/27/2017 1953   KETONESUR NEGATIVE 05/27/2017 1953   PROTEINUR 100 (A) 05/27/2017 1953   UROBILINOGEN 1.0 08/11/2014 2239   NITRITE NEGATIVE 05/27/2017 1953   LEUKOCYTESUR NEGATIVE 05/27/2017 1953   Sepsis Labs: @LABRCNTIP (procalcitonin:4,lacticidven:4) ) Recent Results (from the past 240 hour(s))  Culture, blood (Routine x 2)     Status: None (Preliminary result)   Collection Time: 05/27/17  6:05 PM  Result Value Ref Range Status   Specimen Description BLOOD LEFT ANTECUBITAL  Final   Special Requests   Final    BOTTLES DRAWN AEROBIC  AND ANAEROBIC Blood Culture adequate volume   Culture NO GROWTH < 24 HOURS  Final   Report Status PENDING  Incomplete  Culture, blood (Routine x 2)     Status: None (Preliminary result)   Collection Time: 05/27/17  6:25 PM  Result Value Ref Range Status   Specimen Description BLOOD BLOOD RIGHT FOREARM  Final   Special Requests IN PEDIATRIC BOTTLE Blood Culture adequate volume  Final   Culture NO GROWTH < 24 HOURS  Final   Report Status PENDING  Incomplete  Respiratory Panel by PCR     Status: None   Collection Time: 05/28/17  9:28 AM  Result Value Ref Range Status   Adenovirus NOT DETECTED NOT DETECTED Final  Coronavirus 229E NOT DETECTED NOT DETECTED Final   Coronavirus HKU1 NOT DETECTED NOT DETECTED Final   Coronavirus NL63 NOT DETECTED NOT DETECTED Final   Coronavirus OC43 NOT DETECTED NOT DETECTED Final   Metapneumovirus NOT DETECTED NOT DETECTED Final   Rhinovirus / Enterovirus NOT DETECTED NOT DETECTED Final   Influenza A NOT DETECTED NOT DETECTED Final   Influenza B NOT DETECTED NOT DETECTED Final   Parainfluenza Virus 1 NOT DETECTED NOT DETECTED Final   Parainfluenza Virus 2 NOT DETECTED NOT DETECTED Final   Parainfluenza Virus 3 NOT DETECTED NOT DETECTED Final   Parainfluenza Virus 4 NOT DETECTED NOT DETECTED Final   Respiratory Syncytial Virus NOT DETECTED NOT DETECTED Final   Bordetella pertussis NOT DETECTED NOT DETECTED Final   Chlamydophila pneumoniae NOT DETECTED NOT DETECTED Final   Mycoplasma pneumoniae NOT DETECTED NOT DETECTED Final         Radiology Studies: Dg Chest 2 View  Result Date: 05/27/2017 CLINICAL DATA:  Nausea, vomiting and abdomen pain.  Hypotension. EXAM: CHEST  2 VIEW COMPARISON:  July 28, 2016 FINDINGS: The heart size and mediastinal contours are stable. The heart size is enlarged. There is a hiatal hernia. Patchy consolidation of right lung base is identified. There is no pulmonary edema or pleural effusion. The lungs are hyperinflated. The  visualized skeletal structures are stable. IMPRESSION: Right lung base pneumonia. Emphysema. Electronically Signed   By: Abelardo Diesel M.D.   On: 05/27/2017 19:10   Ct Abdomen Pelvis W Contrast  Result Date: 05/28/2017 CLINICAL DATA:  84 y/o F; nausea, poor appetite, not eating or drinking for 2-3 days. History of bile duct tumor status post Whipple in 2008. Subjective fever. EXAM: CT ABDOMEN AND PELVIS WITH CONTRAST TECHNIQUE: Multidetector CT imaging of the abdomen and pelvis was performed using the standard protocol following bolus administration of intravenous contrast. CONTRAST:  167mL ISOVUE-300 IOPAMIDOL (ISOVUE-300) INJECTION 61% COMPARISON:  08/09/2016 CT abdomen and pelvis. FINDINGS: Lower chest: Bibasilar clustered nodules in bronchovascular distribution compatible with bronchiolitis. Large hiatal hernia. Hepatobiliary: No focal liver abnormality is seen. Status post cholecystectomy. No biliary dilatation. Pancreas: Status post Whipple procedure with resection of head and tail of pancreas. Body and tail of pancreas are unremarkable. Spleen: Normal in size without focal abnormality. Adrenals/Urinary Tract: Normal adrenal glands. A large stable right kidney upper pole cyst. No additional kidney lesion identified. Normal ureters. Normal bladder. Stomach/Bowel: Stomach is within normal limits. Appendix not identified. No evidence of bowel wall thickening, distention, or inflammatory changes. Vascular/Lymphatic: Aortic atherosclerosis. No enlarged abdominal or pelvic lymph nodes. Reproductive: Status post hysterectomy. No adnexal masses. Other: No abdominal wall hernia or abnormality. No abdominopelvic ascites. Musculoskeletal: Stable severe T12 anterior compression deformity post kyphoplasty. Stable L2 moderate compression deformity post kyphoplasty. Stable mild L3 compression deformity. No acute fracture identified. IMPRESSION: 1. Progressed bibasilar clustered nodular opacities compatible with acute  bronchiolitis, possibly due to aspiration given distribution. 2. Stable large hiatal hernia. 3. Multiple compression deformities post kyphoplasty at T12 and L2 are stable. 4. Aortic atherosclerosis. 5. Otherwise stable CT of abdomen and pelvis. Electronically Signed   By: Kristine Garbe M.D.   On: 05/28/2017 01:20      Scheduled Meds: . acidophilus  1 capsule Oral Daily  . cholecalciferol  2,000 Units Oral Daily  . dextromethorphan-guaiFENesin  1 tablet Oral BID  . enoxaparin (LOVENOX) injection  20 mg Subcutaneous Q24H  . feeding supplement  1 Container Oral TID BM  . ferrous sulfate  325 mg Oral Q  breakfast  . Influenza vac split quadrivalent PF  0.5 mL Intramuscular Tomorrow-1000  . nicotine  21 mg Transdermal Daily  . pantoprazole  40 mg Oral Daily  . polyethylene glycol  17 g Oral Daily   Continuous Infusions: . sodium chloride 100 mL/hr at 05/28/17 1742  . azithromycin Stopped (05/29/17 0125)  . cefTRIAXone (ROCEPHIN)  IV Stopped (05/29/17 0024)  . metronidazole 500 mg (05/29/17 0255)     LOS: 1 day    Time spent in minutes: [place], MD [facility] Pager: [email or web address] Password [facility] 05/29/2017, 11:33 AM

## 2017-05-30 DIAGNOSIS — Z720 Tobacco use: Secondary | ICD-10-CM

## 2017-05-30 DIAGNOSIS — R531 Weakness: Secondary | ICD-10-CM

## 2017-05-30 DIAGNOSIS — J181 Lobar pneumonia, unspecified organism: Secondary | ICD-10-CM

## 2017-05-30 DIAGNOSIS — J9601 Acute respiratory failure with hypoxia: Secondary | ICD-10-CM

## 2017-05-30 MED ORDER — DM-GUAIFENESIN ER 30-600 MG PO TB12: 1.0000 | ORAL_TABLET | Freq: Two times a day (BID) | ORAL | 0 refills | Status: AC

## 2017-05-30 MED ORDER — BOOST PLUS PO LIQD
237.0000 mL | Freq: Three times a day (TID) | ORAL | Status: DC
  Administered 2017-05-30: 237 mL via ORAL
  Filled 2017-05-30 (×5): qty 237

## 2017-05-30 MED ORDER — AZITHROMYCIN 500 MG PO TABS
500.0000 mg | ORAL_TABLET | Freq: Every day | ORAL | Status: DC
  Administered 2017-05-30: 500 mg via ORAL
  Filled 2017-05-30: qty 1

## 2017-05-30 MED ORDER — NICOTINE 21 MG/24HR TD PT24: 21.0000 mg | MEDICATED_PATCH | Freq: Every day | TRANSDERMAL | 0 refills | Status: DC

## 2017-05-30 MED ORDER — AZITHROMYCIN 500 MG PO TABS: 500.0000 mg | ORAL_TABLET | Freq: Every day | ORAL | 0 refills | Status: DC

## 2017-05-30 MED ORDER — AMOXICILLIN-POT CLAVULANATE 500-125 MG PO TABS
1.0000 | ORAL_TABLET | Freq: Two times a day (BID) | ORAL | Status: DC
  Administered 2017-05-30: 500 mg via ORAL
  Filled 2017-05-30 (×2): qty 1

## 2017-05-30 MED ORDER — AMOXICILLIN-POT CLAVULANATE 500-125 MG PO TABS: 1.0000 | ORAL_TABLET | Freq: Two times a day (BID) | ORAL | 0 refills | Status: DC

## 2017-05-30 NOTE — Progress Notes (Signed)
Pt discharged home, per order. IV and telemetry box removed. Pt received discharge instructions and all questions were answered. Pt was instructed to pick up her prescriptions at her pharmacy. Pt verbalized understanding. Pt left with all of her belongings. Pt discharged via wheelchair and was accompanied by pt's nurse tech.    Grant Fontana BSN, RN

## 2017-05-30 NOTE — Progress Notes (Addendum)
Initial Nutrition Assessment  DOCUMENTATION CODES:   Severe malnutrition in context of chronic illness, Underweight  INTERVENTION:    Boost Plus po TID, each supplement provides 360 kcal and 14 grams of protein  NUTRITION DIAGNOSIS:   Severe Malnutrition related to chronic illness(chronic abdominal pain, inadequate oral intake) as evidenced by severe fat depletion, severe muscle depletion  GOAL:   Patient will meet greater than or equal to 90% of their needs  MONITOR:   PO intake, Supplement acceptance, Labs, Weight trends, Skin  REASON FOR ASSESSMENT:   Consult, Malnutrition Screening Tool Assessment of nutrition requirement/status  ASSESSMENT:   84 y.o. 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  Pt reports a poor appetite. Lives with her husband and daughter. She states she cooks for her family. Typically consumes 2 meals per day. Likes Boost supplements and sometimes drinks at home.  Per wt readings below, pt's wt has been stable. Stated UBW is 93 lbs. Medications include Vitamin D, ferrous sulfate, Miralax and Protonix. Labs reviewed. Na 131 (L). CBG's 103-138.  NUTRITION - FOCUSED PHYSICAL EXAM:    Most Recent Value  Orbital Region  Severe depletion  Upper Arm Region  Severe depletion  Thoracic and Lumbar Region  Severe depletion  Buccal Region  Severe depletion  Temple Region  Severe depletion  Clavicle Bone Region  Severe depletion  Clavicle and Acromion Bone Region  Severe depletion  Scapular Bone Region  Unable to assess  Dorsal Hand  Unable to assess  Patellar Region  Severe depletion  Anterior Thigh Region  Severe depletion  Posterior Calf Region  Severe depletion  Edema (RD Assessment)  None     Diet Order:  DIET DYS 3 Room service appropriate? Yes;  Fluid consistency: Thin  EDUCATION NEEDS:   No education needs have been identified at this time  Skin:  Skin Assessment: Reviewed RN Assessment  Last BM:  12/31  Height:   Ht Readings from Last 1 Encounters:  05/29/17 5\' 4"  (1.626 m)   Weight:   Wt Readings from Last 1 Encounters:  05/29/17 97 lb 3.2 oz (44.1 kg)   Wt Readings from Last 10 Encounters:  05/29/17 97 lb 3.2 oz (44.1 kg)  08/09/16 98 lb (44.5 kg)  11/19/15 105 lb (47.6 kg)  10/01/14 122 lb (55.3 kg)  11/14/13 115 lb (52.2 kg)  09/12/13 120 lb (54.4 kg)  08/30/12 112 lb 6.4 oz (51 kg)  03/05/12 111 lb 8 oz (50.6 kg)  09/05/11 116 lb (52.6 kg)   Ideal Body Weight:  54.5 kg  BMI:  Body mass index is 16.68 kg/m.  Estimated Nutritional Needs:   Kcal:  1300-1500  Protein:  65-80 gm  Fluid:  >/= 1.5 L  Arthur Holms, RD, LDN Pager #: [phone] After-Hours Pager #: [phone]

## 2017-05-30 NOTE — Discharge Summary (Signed)
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 No dyspnea and no chest pain.   Brief Summary:  Mackenzie Gigante Hawkinsis a 84 y.o.femalewith medical history significant ofcancer of the ampulla of Vater(s/p of Whipple procedure),GERD, iron deficiency anemia, tobacco abuse, chronic abdominal pain, who presents with cough, shortness breath, nausea, vomiting and abdominal pain. She is admitted for a RLL infiltrate, leukocytosis and hypotension.   Hospital Course:  Principal Problem: CAP/ leukocytosis/ hypotension- sepsis - has been on Rocephin and Zithromax- Flagyl was started for possible aspiration in relation to vomiting - con to follow  - added cough suppressant   - resp virus panel negative - will d/c home with Augmentin and Zithromax  Active Problems: Hypotension - in ER, SBP  In 70s- SBP now in low 100s after giving numerous fluid boluses     Cancer of ampulla of Vater:s/p Whipple 2008 [facility] -When necessary Zofran for nausea and morphine for pain  Protein-calorie malnutrition, severe ([place]) -Nutrition consult     Discharge Exam: Vitals:   05/29/17 2331 05/30/17 0400  BP: (!) 110/58 104/60  Pulse: 83 72  Resp: (!) 26 (!) 25  Temp: 98 F (36.7 C) 98.4 F (36.9 C)  SpO2: 93% 92%   Vitals:   05/29/17 1630 05/29/17 2026 05/29/17  2331 05/30/17 0400  BP: (!) 101/45 (!) 101/57 (!) 110/58 104/60  Pulse: 72  83 72  Resp:  20 (!) 26 (!) 25  Temp: 99.2 F (37.3 C) (!) 97 F (36.1 C) 98 F (36.7 C) 98.4 F (36.9 C)  TempSrc: Oral Axillary Oral Oral  SpO2:  96% 93% 92%  Weight:      Height:        General: Pt is alert, awake, not in acute distress Cardiovascular: RRR, S1/S2 +, no rubs, no gallops Respiratory:  Crackles in RLL, no wheezing, no rhonchi Abdominal: Soft, NT, ND, bowel sounds + Extremities: no edema, no cyanosis   Discharge Instructions  Discharge Instructions    Diet - low sodium heart healthy   Complete by:  As directed    Increase activity slowly   Complete by:  As directed      Allergies as of 05/30/2017      Reactions   Aspirin Hives   Codeine-promethazine [promethazine-codeine] Nausea And Vomiting   Estrogens Hives   Phenobarbital Itching      Medication List    TAKE these medications   acetaminophen 500 MG tablet Commonly known as:  TYLENOL Take 1 tablet (500 mg total) by mouth every 6 (six) hours as needed for moderate pain or headache.   albuterol 108 (90 Base) MCG/ACT inhaler Commonly known as:  PROVENTIL HFA;VENTOLIN HFA Inhale 1-2 puffs into the lungs every 6 (six) hours as needed for wheezing or shortness of breath.   amoxicillin-clavulanate 500-125 MG tablet Commonly known  as:  AUGMENTIN Take 1 tablet (500 mg total) by mouth 2 (two) times daily with a meal.   azithromycin 500 MG tablet Commonly known as:  ZITHROMAX Take 1 tablet (500 mg total) by mouth daily. Start taking on:  05/31/2017   cholecalciferol 1000 units tablet Commonly known as:  VITAMIN D Take 2,000 Units by mouth daily.   dextromethorphan-guaiFENesin 30-600 MG 12hr tablet Commonly known as:  MUCINEX DM Take 1 tablet by mouth 2 (two) times daily.   feeding supplement Liqd Take 1 Container by mouth 3 (three) times daily between meals. What changed:  when to take this   ferrous sulfate 325 (65  FE) MG tablet Take 325 mg by mouth daily with breakfast.   iron polysaccharides 150 MG capsule Commonly known as:  NU-IRON Take 1 capsule (150 mg total) by mouth daily.   LORazepam 1 MG tablet Commonly known as:  ATIVAN Take 1 mg by mouth 2 (two) times daily as needed for anxiety.   nicotine 21 mg/24hr patch Commonly known as:  NICODERM CQ - dosed in mg/24 hours Place 1 patch (21 mg total) onto the skin daily. Start taking on:  05/31/2017   pantoprazole 40 MG tablet Commonly known as:  PROTONIX Take 1 tablet (40 mg total) by mouth 2 (two) times daily before a meal. What changed:  when to take this   polyethylene glycol packet Commonly known as:  MIRALAX / GLYCOLAX Take 17 g by mouth daily. What changed:    when to take this  reasons to take this  additional instructions   PROBIOTIC PO Take 1 tablet by mouth daily.   saccharomyces boulardii 250 MG capsule Commonly known as:  FLORASTOR Take 1 capsule (250 mg total) by mouth 2 (two) times daily.       Allergies  Allergen Reactions  . Aspirin Hives  . Codeine-Promethazine [Promethazine-Codeine] Nausea And Vomiting  . Estrogens Hives  . Phenobarbital Itching     Procedures/Studies:  Dg Chest 2 View  Result Date: 05/27/2017 CLINICAL DATA:  Nausea, vomiting and abdomen pain.  Hypotension. EXAM: CHEST  2 VIEW COMPARISON:  July 28, 2016 FINDINGS: The heart size and mediastinal contours are stable. The heart size is enlarged. There is a hiatal hernia. Patchy consolidation of right lung base is identified. There is no pulmonary edema or pleural effusion. The lungs are hyperinflated. The visualized skeletal structures are stable. IMPRESSION: Right lung base pneumonia. Emphysema. Electronically Signed   By: Abelardo Diesel M.D.   On: 05/27/2017 19:10   Ct Abdomen Pelvis W Contrast  Result Date: 05/28/2017 CLINICAL DATA:  84 y/o F; nausea, poor appetite, not eating or drinking for 2-3 days. History of bile duct tumor status  post Whipple in 2008. Subjective fever. EXAM: CT ABDOMEN AND PELVIS WITH CONTRAST TECHNIQUE: Multidetector CT imaging of the abdomen and pelvis was performed using the standard protocol following bolus administration of intravenous contrast. CONTRAST:  141mL ISOVUE-300 IOPAMIDOL (ISOVUE-300) INJECTION 61% COMPARISON:  08/09/2016 CT abdomen and pelvis. FINDINGS: Lower chest: Bibasilar clustered nodules in bronchovascular distribution compatible with bronchiolitis. Large hiatal hernia. Hepatobiliary: No focal liver abnormality is seen. Status post cholecystectomy. No biliary dilatation. Pancreas: Status post Whipple procedure with resection of head and tail of pancreas. Body and tail of pancreas are unremarkable. Spleen: Normal in size without focal abnormality. Adrenals/Urinary Tract: Normal adrenal glands. A large stable right kidney upper pole cyst. No additional kidney lesion identified. Normal ureters. Normal bladder. Stomach/Bowel: Stomach is within normal limits. Appendix not identified.  No evidence of bowel wall thickening, distention, or inflammatory changes. Vascular/Lymphatic: Aortic atherosclerosis. No enlarged abdominal or pelvic lymph nodes. Reproductive: Status post hysterectomy. No adnexal masses. Other: No abdominal wall hernia or abnormality. No abdominopelvic ascites. Musculoskeletal: Stable severe T12 anterior compression deformity post kyphoplasty. Stable L2 moderate compression deformity post kyphoplasty. Stable mild L3 compression deformity. No acute fracture identified. IMPRESSION: 1. Progressed bibasilar clustered nodular opacities compatible with acute bronchiolitis, possibly due to aspiration given distribution. 2. Stable large hiatal hernia. 3. Multiple compression deformities post kyphoplasty at T12 and L2 are stable. 4. Aortic atherosclerosis. 5. Otherwise stable CT of abdomen and pelvis. Electronically Signed   By: Kristine Garbe M.D.   On: 05/28/2017 01:20     The results  of significant diagnostics from this hospitalization (including imaging, microbiology, ancillary and laboratory) are listed below for reference.     Microbiology: Recent Results (from the past 240 hour(s))  Culture, blood (Routine x 2)     Status: None (Preliminary result)   Collection Time: 05/27/17  6:05 PM  Result Value Ref Range Status   Specimen Description BLOOD LEFT ANTECUBITAL  Final   Special Requests   Final    BOTTLES DRAWN AEROBIC AND ANAEROBIC Blood Culture adequate volume   Culture NO GROWTH 2 DAYS  Final   Report Status PENDING  Incomplete  Culture, blood (Routine x 2)     Status: None (Preliminary result)   Collection Time: 05/27/17  6:25 PM  Result Value Ref Range Status   Specimen Description BLOOD BLOOD RIGHT FOREARM  Final   Special Requests IN PEDIATRIC BOTTLE Blood Culture adequate volume  Final   Culture NO GROWTH 2 DAYS  Final   Report Status PENDING  Incomplete  Respiratory Panel by PCR     Status: None   Collection Time: 05/28/17  9:28 AM  Result Value Ref Range Status   Adenovirus NOT DETECTED NOT DETECTED Final   Coronavirus 229E NOT DETECTED NOT DETECTED Final   Coronavirus HKU1 NOT DETECTED NOT DETECTED Final   Coronavirus NL63 NOT DETECTED NOT DETECTED Final   Coronavirus OC43 NOT DETECTED NOT DETECTED Final   Metapneumovirus NOT DETECTED NOT DETECTED Final   Rhinovirus / Enterovirus NOT DETECTED NOT DETECTED Final   Influenza A NOT DETECTED NOT DETECTED Final   Influenza B NOT DETECTED NOT DETECTED Final   Parainfluenza Virus 1 NOT DETECTED NOT DETECTED Final   Parainfluenza Virus 2 NOT DETECTED NOT DETECTED Final   Parainfluenza Virus 3 NOT DETECTED NOT DETECTED Final   Parainfluenza Virus 4 NOT DETECTED NOT DETECTED Final   Respiratory Syncytial Virus NOT DETECTED NOT DETECTED Final   Bordetella pertussis NOT DETECTED NOT DETECTED Final   Chlamydophila pneumoniae NOT DETECTED NOT DETECTED Final   Mycoplasma pneumoniae NOT DETECTED NOT DETECTED  Final     Labs: BNP (last 3 results) No results for input(s): BNP in the last 8760 hours. Basic Metabolic Panel: Recent Labs  Lab 05/27/17 1804 05/28/17 0829  NA 130* 131*  K 3.4* 3.6  CL 94* 103  CO2 24 23  GLUCOSE 138* 103*  BUN 13 7  CREATININE 0.82 0.67  CALCIUM 8.4* 7.3*   Liver Function Tests: Recent Labs  Lab 05/27/17 1804  AST 19  ALT 9*  ALKPHOS 96  BILITOT 0.7  PROT 6.1*  ALBUMIN 2.6*   Recent Labs  Lab 05/27/17 1804  LIPASE 17   No results for input(s): AMMONIA in the last 168 hours. CBC: Recent Labs  Lab 05/27/17 1804  05/28/17 0829  WBC 17.9* 10.4  NEUTROABS 16.5*  --   HGB 9.3* 8.0*  HCT 27.9* 24.5*  MCV 90.6 93.2  PLT 265 172   Cardiac Enzymes: No results for input(s): CKTOTAL, CKMB, CKMBINDEX, TROPONINI in the last 168 hours. BNP: Invalid input(s): POCBNP CBG: No results for input(s): GLUCAP in the last 168 hours. D-Dimer No results for input(s): DDIMER in the last 72 hours. Hgb A1c No results for input(s): HGBA1C in the last 72 hours. Lipid Profile No results for input(s): CHOL, HDL, LDLCALC, TRIG, CHOLHDL, LDLDIRECT in the last 72 hours. Thyroid function studies No results for input(s): TSH, T4TOTAL, T3FREE, THYROIDAB in the last 72 hours.  Invalid input(s): FREET3 Anemia work up No results for input(s): VITAMINB12, FOLATE, FERRITIN, TIBC, IRON, RETICCTPCT in the last 72 hours. Urinalysis    Component Value Date/Time   COLORURINE YELLOW 05/27/2017 1953   APPEARANCEUR HAZY (A) 05/27/2017 1953   LABSPEC 1.025 05/27/2017 1953   PHURINE 5.0 05/27/2017 1953   GLUCOSEU NEGATIVE 05/27/2017 1953   HGBUR NEGATIVE 05/27/2017 1953   BILIRUBINUR NEGATIVE 05/27/2017 1953   KETONESUR NEGATIVE 05/27/2017 1953   PROTEINUR 100 (A) 05/27/2017 1953   UROBILINOGEN 1.0 08/11/2014 2239   NITRITE NEGATIVE 05/27/2017 1953   LEUKOCYTESUR NEGATIVE 05/27/2017 1953   Sepsis Labs Invalid input(s): PROCALCITONIN,  WBC,   LACTICIDVEN Microbiology Recent Results (from the past 240 hour(s))  Culture, blood (Routine x 2)     Status: None (Preliminary result)   Collection Time: 05/27/17  6:05 PM  Result Value Ref Range Status   Specimen Description BLOOD LEFT ANTECUBITAL  Final   Special Requests   Final    BOTTLES DRAWN AEROBIC AND ANAEROBIC Blood Culture adequate volume   Culture NO GROWTH 2 DAYS  Final   Report Status PENDING  Incomplete  Culture, blood (Routine x 2)     Status: None (Preliminary result)   Collection Time: 05/27/17  6:25 PM  Result Value Ref Range Status   Specimen Description BLOOD BLOOD RIGHT FOREARM  Final   Special Requests IN PEDIATRIC BOTTLE Blood Culture adequate volume  Final   Culture NO GROWTH 2 DAYS  Final   Report Status PENDING  Incomplete  Respiratory Panel by PCR     Status: None   Collection Time: 05/28/17  9:28 AM  Result Value Ref Range Status   Adenovirus NOT DETECTED NOT DETECTED Final   Coronavirus 229E NOT DETECTED NOT DETECTED Final   Coronavirus HKU1 NOT DETECTED NOT DETECTED Final   Coronavirus NL63 NOT DETECTED NOT DETECTED Final   Coronavirus OC43 NOT DETECTED NOT DETECTED Final   Metapneumovirus NOT DETECTED NOT DETECTED Final   Rhinovirus / Enterovirus NOT DETECTED NOT DETECTED Final   Influenza A NOT DETECTED NOT DETECTED Final   Influenza B NOT DETECTED NOT DETECTED Final   Parainfluenza Virus 1 NOT DETECTED NOT DETECTED Final   Parainfluenza Virus 2 NOT DETECTED NOT DETECTED Final   Parainfluenza Virus 3 NOT DETECTED NOT DETECTED Final   Parainfluenza Virus 4 NOT DETECTED NOT DETECTED Final   Respiratory Syncytial Virus NOT DETECTED NOT DETECTED Final   Bordetella pertussis NOT DETECTED NOT DETECTED Final   Chlamydophila pneumoniae NOT DETECTED NOT DETECTED Final   Mycoplasma pneumoniae NOT DETECTED NOT DETECTED Final     Time coordinating discharge: Over 30 minutes  SIGNED:   Debbe Odea, MD  [facility] 05/30/2017, 1:53 PM Pager    If 7PM-7AM, please contact night-coverage [email or web address] Password [email or web address]

## 2017-05-30 NOTE — Progress Notes (Signed)
Pt ambulated 100 feet in hallway on room air. Pt tolerated well. Pt's oxygen saturation remained 97-100% on room air.   Grant Fontana BSN, RN

## 2017-05-31 NOTE — Consult Note (Signed)
            [facility] CM Primary Care Navigator  05/31/2017  Mackenzie Mendoza Dec 07, 1933 [id number]   Attempt to seepatient at the bedsideto identify possible discharge needs but she was alreadydischargedper staff report.  She wasadmitted for cough, shortness breath, nausea, vomiting and abdominal pain. Patient was treated mainly for lobar pneumonia (possible aspiration related to vomiting)/ sepsis and hypotension.  Patient was discharged homeyesterday.  Primary care provider's officeis listed asprovidingtransition of care (TOC).   For additional questions please contact:  Edwena Felty A. Quynh Basso, BSN, RN-BC [facility] PRIMARY CARE Navigator Cell: [phone]

## 2017-06-01 LAB — CULTURE, BLOOD (ROUTINE X 2)
CULTURE: NO GROWTH
Culture: NO GROWTH
Special Requests: ADEQUATE
Special Requests: ADEQUATE

## 2017-06-05 DIAGNOSIS — J181 Lobar pneumonia, unspecified organism: Secondary | ICD-10-CM | POA: Diagnosis not present

## 2017-06-05 DIAGNOSIS — Z720 Tobacco use: Secondary | ICD-10-CM | POA: Diagnosis not present

## 2017-06-05 DIAGNOSIS — J449 Chronic obstructive pulmonary disease, unspecified: Secondary | ICD-10-CM | POA: Diagnosis not present

## 2017-06-05 DIAGNOSIS — M810 Age-related osteoporosis without current pathological fracture: Secondary | ICD-10-CM | POA: Diagnosis not present

## 2017-06-05 DIAGNOSIS — Z1331 Encounter for screening for depression: Secondary | ICD-10-CM | POA: Diagnosis not present

## 2017-06-05 DIAGNOSIS — Z0000 Encounter for general adult medical examination without abnormal findings: Secondary | ICD-10-CM | POA: Diagnosis not present

## 2017-06-05 DIAGNOSIS — Z23 Encounter for immunization: Secondary | ICD-10-CM | POA: Diagnosis not present

## 2017-06-05 DIAGNOSIS — E43 Unspecified severe protein-calorie malnutrition: Secondary | ICD-10-CM | POA: Diagnosis not present

## 2017-06-05 DIAGNOSIS — F324 Major depressive disorder, single episode, in partial remission: Secondary | ICD-10-CM | POA: Diagnosis not present

## 2017-10-08 DIAGNOSIS — R112 Nausea with vomiting, unspecified: Secondary | ICD-10-CM | POA: Diagnosis not present

## 2017-10-08 DIAGNOSIS — D72829 Elevated white blood cell count, unspecified: Secondary | ICD-10-CM | POA: Diagnosis not present

## 2017-10-08 DIAGNOSIS — K2900 Acute gastritis without bleeding: Secondary | ICD-10-CM | POA: Diagnosis not present

## 2017-10-10 ENCOUNTER — Other Ambulatory Visit: Payer: Self-pay | Admitting: Geriatric Medicine

## 2017-10-10 DIAGNOSIS — R112 Nausea with vomiting, unspecified: Secondary | ICD-10-CM

## 2017-10-12 ENCOUNTER — Other Ambulatory Visit: Payer: Self-pay | Admitting: Internal Medicine

## 2017-10-12 DIAGNOSIS — R112 Nausea with vomiting, unspecified: Secondary | ICD-10-CM

## 2017-10-13 ENCOUNTER — Inpatient Hospital Stay (HOSPITAL_COMMUNITY)
Admission: EM | Admit: 2017-10-13 | Discharge: 2017-10-16 | DRG: 871 | Disposition: A | Discharge status: Home / Self Care | Payer: Medicare Other | Attending: Internal Medicine | Admitting: Internal Medicine

## 2017-10-13 ENCOUNTER — Encounter (HOSPITAL_COMMUNITY): Payer: Self-pay

## 2017-10-13 ENCOUNTER — Other Ambulatory Visit: Payer: Self-pay

## 2017-10-13 ENCOUNTER — Emergency Department (HOSPITAL_COMMUNITY): Payer: Medicare Other

## 2017-10-13 DIAGNOSIS — Z9049 Acquired absence of other specified parts of digestive tract: Secondary | ICD-10-CM | POA: Diagnosis not present

## 2017-10-13 DIAGNOSIS — R062 Wheezing: Secondary | ICD-10-CM | POA: Diagnosis not present

## 2017-10-13 DIAGNOSIS — J189 Pneumonia, unspecified organism: Secondary | ICD-10-CM | POA: Diagnosis not present

## 2017-10-13 DIAGNOSIS — Z888 Allergy status to other drugs, medicaments and biological substances status: Secondary | ICD-10-CM

## 2017-10-13 DIAGNOSIS — I4891 Unspecified atrial fibrillation: Secondary | ICD-10-CM | POA: Diagnosis present

## 2017-10-13 DIAGNOSIS — J440 Chronic obstructive pulmonary disease with acute lower respiratory infection: Secondary | ICD-10-CM | POA: Diagnosis present

## 2017-10-13 DIAGNOSIS — Z7901 Long term (current) use of anticoagulants: Secondary | ICD-10-CM | POA: Diagnosis not present

## 2017-10-13 DIAGNOSIS — Z8509 Personal history of malignant neoplasm of other digestive organs: Secondary | ICD-10-CM

## 2017-10-13 DIAGNOSIS — Z681 Body mass index (BMI) 19 or less, adult: Secondary | ICD-10-CM

## 2017-10-13 DIAGNOSIS — K219 Gastro-esophageal reflux disease without esophagitis: Secondary | ICD-10-CM | POA: Diagnosis not present

## 2017-10-13 DIAGNOSIS — F17210 Nicotine dependence, cigarettes, uncomplicated: Secondary | ICD-10-CM | POA: Diagnosis present

## 2017-10-13 DIAGNOSIS — Z801 Family history of malignant neoplasm of trachea, bronchus and lung: Secondary | ICD-10-CM

## 2017-10-13 DIAGNOSIS — D72829 Elevated white blood cell count, unspecified: Secondary | ICD-10-CM | POA: Diagnosis present

## 2017-10-13 DIAGNOSIS — Z886 Allergy status to analgesic agent status: Secondary | ICD-10-CM

## 2017-10-13 DIAGNOSIS — E871 Hypo-osmolality and hyponatremia: Secondary | ICD-10-CM | POA: Diagnosis present

## 2017-10-13 DIAGNOSIS — M810 Age-related osteoporosis without current pathological fracture: Secondary | ICD-10-CM | POA: Diagnosis present

## 2017-10-13 DIAGNOSIS — Z885 Allergy status to narcotic agent status: Secondary | ICD-10-CM

## 2017-10-13 DIAGNOSIS — K210 Gastro-esophageal reflux disease with esophagitis: Secondary | ICD-10-CM | POA: Diagnosis not present

## 2017-10-13 DIAGNOSIS — D509 Iron deficiency anemia, unspecified: Secondary | ICD-10-CM | POA: Diagnosis present

## 2017-10-13 DIAGNOSIS — D649 Anemia, unspecified: Secondary | ICD-10-CM | POA: Diagnosis present

## 2017-10-13 DIAGNOSIS — R0603 Acute respiratory distress: Secondary | ICD-10-CM | POA: Diagnosis present

## 2017-10-13 DIAGNOSIS — Z90710 Acquired absence of both cervix and uterus: Secondary | ICD-10-CM | POA: Diagnosis not present

## 2017-10-13 DIAGNOSIS — D72820 Lymphocytosis (symptomatic): Secondary | ICD-10-CM | POA: Diagnosis not present

## 2017-10-13 DIAGNOSIS — A419 Sepsis, unspecified organism: Secondary | ICD-10-CM | POA: Diagnosis not present

## 2017-10-13 DIAGNOSIS — I959 Hypotension, unspecified: Secondary | ICD-10-CM | POA: Diagnosis present

## 2017-10-13 DIAGNOSIS — Z803 Family history of malignant neoplasm of breast: Secondary | ICD-10-CM | POA: Diagnosis not present

## 2017-10-13 DIAGNOSIS — R509 Fever, unspecified: Secondary | ICD-10-CM | POA: Diagnosis not present

## 2017-10-13 DIAGNOSIS — E43 Unspecified severe protein-calorie malnutrition: Secondary | ICD-10-CM | POA: Diagnosis not present

## 2017-10-13 DIAGNOSIS — R0781 Pleurodynia: Secondary | ICD-10-CM | POA: Diagnosis not present

## 2017-10-13 DIAGNOSIS — R05 Cough: Secondary | ICD-10-CM | POA: Diagnosis not present

## 2017-10-13 DIAGNOSIS — J181 Lobar pneumonia, unspecified organism: Secondary | ICD-10-CM | POA: Diagnosis not present

## 2017-10-13 DIAGNOSIS — R110 Nausea: Secondary | ICD-10-CM | POA: Diagnosis not present

## 2017-10-13 LAB — I-STAT TROPONIN, ED
TROPONIN I, POC: 0.02 ng/mL (ref 0.00–0.08)
Troponin i, poc: 0 ng/mL (ref 0.00–0.08)

## 2017-10-13 LAB — BASIC METABOLIC PANEL
Anion gap: 10 (ref 5–15)
BUN: 8 mg/dL (ref 6–20)
CHLORIDE: 91 mmol/L — AB (ref 101–111)
CO2: 29 mmol/L (ref 22–32)
Calcium: 8.8 mg/dL — ABNORMAL LOW (ref 8.9–10.3)
Creatinine, Ser: 0.94 mg/dL (ref 0.44–1.00)
GFR calc non Af Amer: 55 mL/min — ABNORMAL LOW (ref >60)
Glucose, Bld: 146 mg/dL — ABNORMAL HIGH (ref 65–99)
POTASSIUM: 4.5 mmol/L (ref 3.5–5.1)
SODIUM: 130 mmol/L — AB (ref 135–145)

## 2017-10-13 LAB — CBC
HEMATOCRIT: 32.1 % — AB (ref 36.0–46.0)
HEMOGLOBIN: 10.3 g/dL — AB (ref 12.0–15.0)
MCH: 29.6 pg (ref 26.0–34.0)
MCHC: 32.1 g/dL (ref 30.0–36.0)
MCV: 92.2 fL (ref 78.0–100.0)
Platelets: 372 10*3/uL (ref 150–400)
RBC: 3.48 MIL/uL — ABNORMAL LOW (ref 3.87–5.11)
RDW: 12.7 % (ref 11.5–15.5)
WBC: 18.5 10*3/uL — AB (ref 4.0–10.5)

## 2017-10-13 LAB — I-STAT CG4 LACTIC ACID, ED: LACTIC ACID, VENOUS: 1.07 mmol/L (ref 0.5–1.9)

## 2017-10-13 LAB — BRAIN NATRIURETIC PEPTIDE: B NATRIURETIC PEPTIDE 5: 130.4 pg/mL — AB (ref 0.0–100.0)

## 2017-10-13 MED ORDER — PANTOPRAZOLE SODIUM 40 MG PO TBEC
40.0000 mg | DELAYED_RELEASE_TABLET | Freq: Every day | ORAL | Status: DC
  Administered 2017-10-14 – 2017-10-16 (×3): 40 mg via ORAL
  Filled 2017-10-13 (×3): qty 1

## 2017-10-13 MED ORDER — SODIUM CHLORIDE 0.9 % IV BOLUS
500.0000 mL | Freq: Once | INTRAVENOUS | Status: AC
  Administered 2017-10-13: 500 mL via INTRAVENOUS

## 2017-10-13 MED ORDER — SODIUM CHLORIDE 0.9 % IV SOLN
1.0000 g | Freq: Once | INTRAVENOUS | Status: AC
  Administered 2017-10-13: 1 g via INTRAVENOUS
  Filled 2017-10-13: qty 10

## 2017-10-13 MED ORDER — SODIUM CHLORIDE 0.9 % IV SOLN
1.0000 g | INTRAVENOUS | Status: DC
  Administered 2017-10-13 – 2017-10-15 (×3): 1 g via INTRAVENOUS
  Filled 2017-10-13: qty 10
  Filled 2017-10-13: qty 1
  Filled 2017-10-13: qty 10

## 2017-10-13 MED ORDER — ACETAMINOPHEN 500 MG PO TABS: 500.0000 mg | ORAL_TABLET | Freq: Four times a day (QID) | ORAL | Status: DC | PRN

## 2017-10-13 MED ORDER — ENOXAPARIN SODIUM 30 MG/0.3ML ~~LOC~~ SOLN
30.0000 mg | SUBCUTANEOUS | Status: DC
  Administered 2017-10-14: 30 mg via SUBCUTANEOUS
  Filled 2017-10-13 (×2): qty 0.3

## 2017-10-13 MED ORDER — ACETAMINOPHEN 325 MG PO TABS
650.0000 mg | ORAL_TABLET | Freq: Once | ORAL | Status: AC | PRN
  Administered 2017-10-13: 650 mg via ORAL
  Filled 2017-10-13: qty 2

## 2017-10-13 MED ORDER — LORAZEPAM 1 MG PO TABS
1.0000 mg | ORAL_TABLET | Freq: Two times a day (BID) | ORAL | Status: DC | PRN
  Administered 2017-10-15: 1 mg via ORAL
  Filled 2017-10-13: qty 1

## 2017-10-13 MED ORDER — SODIUM CHLORIDE 0.9 % IV SOLN
INTRAVENOUS | Status: DC
  Administered 2017-10-13 – 2017-10-16 (×5): via INTRAVENOUS

## 2017-10-13 MED ORDER — BOOST / RESOURCE BREEZE PO LIQD CUSTOM
1.0000 | Freq: Every day | ORAL | Status: DC
  Administered 2017-10-13: 1 via ORAL
  Administered 2017-10-14: 11:00:00 via ORAL

## 2017-10-13 MED ORDER — NICOTINE 21 MG/24HR TD PT24
21.0000 mg | MEDICATED_PATCH | Freq: Every day | TRANSDERMAL | Status: DC
  Filled 2017-10-13 (×2): qty 1

## 2017-10-13 MED ORDER — PROBIOTIC PO CAPS: ORAL_CAPSULE | Freq: Every day | ORAL | Status: DC

## 2017-10-13 MED ORDER — VITAMIN D 1000 UNITS PO TABS
2000.0000 [IU] | ORAL_TABLET | Freq: Every day | ORAL | Status: DC
  Administered 2017-10-14 – 2017-10-16 (×3): 2000 [IU] via ORAL
  Filled 2017-10-13 (×2): qty 2

## 2017-10-13 MED ORDER — AZITHROMYCIN 250 MG PO TABS
500.0000 mg | ORAL_TABLET | Freq: Once | ORAL | Status: AC
  Administered 2017-10-13: 500 mg via ORAL
  Filled 2017-10-13: qty 2

## 2017-10-13 MED ORDER — FERROUS SULFATE 325 (65 FE) MG PO TABS
325.0000 mg | ORAL_TABLET | Freq: Every day | ORAL | Status: DC
  Administered 2017-10-14 – 2017-10-16 (×3): 325 mg via ORAL
  Filled 2017-10-13 (×3): qty 1

## 2017-10-13 MED ORDER — POLYETHYLENE GLYCOL 3350 17 G PO PACK: 17.0000 g | PACK | Freq: Every day | ORAL | Status: DC | PRN

## 2017-10-13 MED ORDER — SODIUM CHLORIDE 0.9 % IV SOLN
500.0000 mg | INTRAVENOUS | Status: DC
  Administered 2017-10-13 – 2017-10-14 (×2): 500 mg via INTRAVENOUS
  Filled 2017-10-13 (×2): qty 500

## 2017-10-13 MED ORDER — DM-GUAIFENESIN ER 30-600 MG PO TB12
1.0000 | ORAL_TABLET | Freq: Two times a day (BID) | ORAL | Status: DC
  Administered 2017-10-13 – 2017-10-16 (×6): 1 via ORAL
  Filled 2017-10-13 (×6): qty 1

## 2017-10-13 NOTE — ED Triage Notes (Signed)
Pt arrived with her son with c/o CP and SOB X1 week. Pt states "I just hurt, don't want nothing to eat, I don't know". Reports NV X1 week ago, denies any recently.

## 2017-10-13 NOTE — ED Provider Notes (Signed)
[place] EMERGENCY DEPARTMENT Provider Note   CSN: [id number] Arrival date & time: 10/13/17  1256     History   Chief Complaint Chief Complaint  Patient presents with  . Chest Pain  . Shortness of Breath    HPI Mackenzie Mendoza is a 84 y.o. female.  Patient with history of pneumonia presents with worsening cough and rib pain with coughing for the past week. Patient said generally decreasing appetite. Low-grade fever today. Patient lives at home and takes care of significant other and does not have much support.patient's cigarette smoker.     Past Medical History:  Diagnosis Date  . Cancer [facility])     Patient Active Problem List   Diagnosis Date Noted  . Dehydration   . Protein-calorie malnutrition, severe ([place])   . Sepsis ([place]) 05/27/2017  . Lobar pneumonia ([place]) 05/27/2017  . Nausea & vomiting 05/27/2017  . Hypokalemia 05/27/2017  . GERD (gastroesophageal reflux disease) 05/27/2017  . Abdominal pain 08/09/2016  . Depression 08/09/2016  . Acute respiratory failure with hypoxia ([place]) 08/09/2016  . Weight loss 03/05/2012  . 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 Osteoporosis 09/05/2011  . Normocytic anemia 09/05/2011  . Other B-complex deficiencies 09/05/2011    Past Surgical History:  Procedure Laterality Date  . ABDOMINAL HYSTERECTOMY    . CHOLECYSTECTOMY    . PANCREATICODUODENECTOMY     [facility].  Neoadj chemoXRT, Whipple 2007-2008     OB History   None      Home Medications    Prior to Admission medications   Medication Sig Start Date End Date Taking? Authorizing Provider  acetaminophen (TYLENOL) 500 MG tablet Take 1 tablet (500 mg total) by mouth every 6 (six) hours as needed for moderate pain or headache. 08/14/16   Regalado, Belkys A, MD  albuterol (PROVENTIL HFA;VENTOLIN HFA) 108 (90 Base) MCG/ACT inhaler Inhale 1-2 puffs into the lungs every 6 (six) hours as needed for wheezing or shortness of breath.  07/28/16   Hedges, Dellis Filbert, PA-C  amoxicillin-clavulanate (AUGMENTIN) 500-125 MG tablet Take 1 tablet (500 mg total) by mouth 2 (two) times daily with a meal. 05/30/17   Debbe Odea, MD  azithromycin (ZITHROMAX) 500 MG tablet Take 1 tablet (500 mg total) by mouth daily. 05/31/17   Debbe Odea, MD  cholecalciferol (VITAMIN D) 1000 units tablet Take 2,000 Units by mouth daily.     [provider]  dextromethorphan-guaiFENesin (MUCINEX DM) 30-600 MG 12hr tablet Take 1 tablet by mouth 2 (two) times daily. 05/30/17   Debbe Odea, MD  feeding supplement (BOOST / RESOURCE BREEZE) LIQD Take 1 Container by mouth 3 (three) times daily between meals. Patient taking differently: Take 1 Container by mouth daily.  08/14/16   Regalado, Belkys A, MD  ferrous sulfate 325 (65 FE) MG tablet Take 325 mg by mouth daily with breakfast.    [provider]  iron polysaccharides (NU-IRON) 150 MG capsule Take 1 capsule (150 mg total) by mouth daily. Patient not taking: Reported on 05/27/2017 08/14/16   Regalado, Jerald Kief A, MD  LORazepam (ATIVAN) 1 MG tablet Take 1 mg by mouth 2 (two) times daily as needed for anxiety.     [provider]  nicotine (NICODERM CQ - DOSED IN MG/24 HOURS) 21 mg/24hr patch Place 1 patch (21 mg total) onto the skin daily. 05/31/17   Debbe Odea, MD  pantoprazole (PROTONIX) 40 MG tablet Take 1 tablet (40 mg total) by mouth 2 (two) times  daily before a meal. Patient taking differently: Take 40 mg by mouth daily.  08/14/16   Regalado, Belkys A, MD  polyethylene glycol (MIRALAX / GLYCOLAX) packet Take 17 g by mouth daily. Patient taking differently: Take 17 g by mouth daily as needed (constipation). Mix in 8 oz liquid and drink 08/15/16   Regalado, Belkys A, MD  Probiotic Product (PROBIOTIC PO) Take 1 tablet by mouth daily.    [provider]  saccharomyces boulardii (FLORASTOR) 250 MG capsule Take 1 capsule (250 mg total) by mouth 2 (two) times daily. Patient not taking:  Reported on 05/27/2017 08/14/16   Elmarie Shiley, MD    Family History Family History  Problem Relation Age of Onset  . Lung cancer Brother   . Breast cancer Sister     Social History Social History   Tobacco Use  . Smoking status: Current Every Day Smoker    Packs/day: 1.00    Types: Cigarettes  . Smokeless tobacco: Never Used  Substance Use Topics  . Alcohol use: No  . Drug use: No     Allergies   Aspirin; Codeine-promethazine [promethazine-codeine]; Estrogens; and Phenobarbital   Review of Systems Review of Systems  Constitutional: Positive for appetite change. Negative for chills and fever.  HENT: Negative for congestion.   Eyes: Negative for visual disturbance.  Respiratory: Positive for cough. Negative for shortness of breath.   Cardiovascular: Positive for chest pain. Negative for leg swelling.  Gastrointestinal: Positive for nausea. Negative for abdominal pain and vomiting.  Genitourinary: Negative for dysuria and flank pain.  Musculoskeletal: Negative for back pain, neck pain and neck stiffness.  Skin: Negative for rash.  Neurological: Negative for light-headedness and headaches.     Physical Exam Updated Vital Signs BP (!) 101/50   Pulse 78   Temp 98.2 F (36.8 C) (Oral)   Resp 17   Ht 5\' 8"  (1.727 m)   Wt 44.5 kg (98 lb)   SpO2 95%   BMI 14.90 kg/m   Physical Exam  Constitutional: She is oriented to person, place, and time. She appears well-developed and well-nourished.  HENT:  Head: Normocephalic and atraumatic.  Dry mucous membranes  Eyes: Conjunctivae are normal. Right eye exhibits no discharge. Left eye exhibits no discharge.  Neck: Normal range of motion. Neck supple. No tracheal deviation present.  Cardiovascular: Normal rate and regular rhythm.  Pulmonary/Chest: Effort normal. She has rales.  Abdominal: Soft. She exhibits no distension. There is no tenderness. There is no guarding.  Musculoskeletal: She exhibits no edema.    General weakness  Neurological: She is alert and oriented to person, place, and time.  Skin: Skin is warm. No rash noted.  Psychiatric: She has a normal mood and affect.  Nursing note and vitals reviewed.    ED Treatments / Results  Labs (all labs ordered are listed, but only abnormal results are displayed) Labs Reviewed  BASIC METABOLIC PANEL - Abnormal; Notable for the following components:      Result Value   Sodium 130 (*)    Chloride 91 (*)    Glucose, Bld 146 (*)    Calcium 8.8 (*)    GFR calc non Af Amer 55 (*)    All other components within normal limits  CBC - Abnormal; Notable for the following components:   WBC 18.5 (*)    RBC 3.48 (*)    Hemoglobin 10.3 (*)    HCT 32.1 (*)    All other components within normal limits  BRAIN  NATRIURETIC PEPTIDE  I-STAT TROPONIN, ED  I-STAT CG4 LACTIC ACID, ED  I-STAT CG4 LACTIC ACID, ED  I-STAT CG4 LACTIC ACID, ED  I-STAT TROPONIN, ED    EKG EKG Interpretation  Date/Time:  Saturday Oct 13 2017 13:24:35 EDT Ventricular Rate:  90 PR Interval:  138 QRS Duration: 72 QT Interval:  342 QTC Calculation: 418 R Axis:   33 Text Interpretation:  Normal sinus rhythm Normal ECG Confirmed by Elnora Morrison [id number]) on 10/13/2017 5:57:33 PM   Radiology Dg Chest 2 View  Result Date: 10/13/2017 CLINICAL DATA:  Cough and fever for 1 week. EXAM: CHEST - 2 VIEW COMPARISON:  05/27/2017 and prior chest radiographs FINDINGS: The cardiomediastinal silhouette is unchanged. Mild patchy opacities within the anterior mid lung on the LATERAL view likely represents pneumonia. COPD/emphysema changes again noted. No pleural effusion or pneumothorax. Thoracic/lumbar compression fractures and vertebral augmentation changes again noted. A large hiatal hernia is again noted. IMPRESSION: Mild patchy opacities within the RIGHT middle lobe versus lingula on the LATERAL view likely representing pneumonia. Follow-up to resolution recommended. COPD/emphysema.  Large hiatal hernia. Electronically Signed   By: Margarette Canada M.D.   On: 10/13/2017 14:04    Procedures Procedures (including critical care time)  Medications Ordered in ED Medications  sodium chloride 0.9 % bolus 500 mL (500 mLs Intravenous New Bag/Given 10/13/17 1841)  acetaminophen (TYLENOL) tablet 650 mg (650 mg Oral Given 10/13/17 1316)  cefTRIAXone (ROCEPHIN) 1 g in sodium chloride 0.9 % 100 mL IVPB (1 g Intravenous New Bag/Given 10/13/17 1841)  azithromycin (ZITHROMAX) tablet 500 mg (500 mg Oral Given 10/13/17 1841)     Initial Impression / Assessment and Plan / ED Course  I have reviewed the triage vital signs and the nursing notes.  Pertinent labs & imaging results that were available during my care of the patient were reviewed by me and considered in my medical decision making (see chart for details).    Patient presents with clinically pneumonia community-acquired. With patient being elderly, generally weak, decreased appetite and no support at home plan for admission. Chest x-ray reviewed and clinically consistent with pneumonia. Blood work reviewed leukocytosis. Initial blood pressure hypotensive that improved in the ER. Small bolus given. Lactate normal. Troponin negative. EKG no acute findings. Discussed with hospice for admission.  The patients results and plan were reviewed and discussed.   Any x-rays performed were independently reviewed by myself.   Differential diagnosis were considered with the presenting HPI.  Medications  sodium chloride 0.9 % bolus 500 mL (500 mLs Intravenous New Bag/Given 10/13/17 1841)  acetaminophen (TYLENOL) tablet 650 mg (650 mg Oral Given 10/13/17 1316)  cefTRIAXone (ROCEPHIN) 1 g in sodium chloride 0.9 % 100 mL IVPB (1 g Intravenous New Bag/Given 10/13/17 1841)  azithromycin (ZITHROMAX) tablet 500 mg (500 mg Oral Given 10/13/17 1841)    Vitals:   10/13/17 1746 10/13/17 1815 10/13/17 1845 10/13/17 1900  BP: (!) 111/54 (!) 100/53 (!) 102/47  (!) 101/50  Pulse: 81 82 83 78  Resp: (!) 21 (!) 23 (!) 28 17  Temp:      TempSrc:      SpO2: 94% 92% 93% 95%  Weight:      Height:        Final diagnoses:  Community acquired pneumonia of right upper lobe of lung ([place])    Admission/ observation were discussed with the admitting physician, patient and/or family and they are comfortable with the plan.   Final Clinical Impressions(s) / ED Diagnoses  Final diagnoses:  Community acquired pneumonia of right upper lobe of lung [facility])    ED Discharge Orders    None       Elnora Morrison, MD 10/13/17 1919

## 2017-10-13 NOTE — ED Notes (Signed)
Admitting Provider at bedside. 

## 2017-10-13 NOTE — H&P (Signed)
History and Physical    Mackenzie Mendoza SAY:[id number] DOB: [birth year] DOA: 10/13/2017  Referring MD/NP/PA: Dr Reather Converse  PCP: Lavone Orn, MD   Outpatient Specialists: None   Patient coming from: Home  Chief Complaint: shortness of breath and chest pain  HPI: Mackenzie Mendoza is a 84 y.o. female with medical history significant of atrial fibrillation on chronic anticoagulation, tobacco abuse with COPD, previous pneumonias presenting with progressive shortness of breath and cough. Patient also has central chest pain. She has had some subjective fever. Patient has productive cough with yellow sputum. She has poor dentition and has had community-acquired pneumonias in the past. Patient currently lives with her husband and daughter who has Guellian Barre syndrome. Her husband is currently in skilled nursing facility temporarily. She is therefore the primary caregiver. No one else is sick at home.   ED Course: patient's vitals included temperature 100.4, blood pressure 88/57 on arrival, respiratory rate of 28 oxygen sat 92% on room air. Lactic acid is 1.07, chest x-ray showed mild patchy opacities within the right middle lobes and lingula. This represented pneumonia. White count is 18.5 with sodium 1:30. Glucose is 146  Review of Systems: As per HPI otherwise 10 point review of systems negative.    Past Medical History:  Diagnosis Date  . Cancer [facility])     Past Surgical History:  Procedure Laterality Date  . ABDOMINAL HYSTERECTOMY    . CHOLECYSTECTOMY    . PANCREATICODUODENECTOMY     [facility].  Neoadj chemoXRT, Whipple 2007-2008     reports that she has been smoking cigarettes.  She has been smoking about 1.00 pack per day. She has never used smokeless tobacco. She reports that she does not drink alcohol or use drugs.  Allergies  Allergen Reactions  . Aspirin Hives  . Codeine-Promethazine [Promethazine-Codeine] Nausea And Vomiting  . Estrogens Hives  . Phenobarbital Itching     Family History  Problem Relation Age of Onset  . Lung cancer Brother   . Breast cancer Sister      Prior to Admission medications   Medication Sig Start Date End Date Taking? Authorizing Provider  acetaminophen (TYLENOL) 500 MG tablet Take 1 tablet (500 mg total) by mouth every 6 (six) hours as needed for moderate pain or headache. 08/14/16   Regalado, Belkys A, MD  albuterol (PROVENTIL HFA;VENTOLIN HFA) 108 (90 Base) MCG/ACT inhaler Inhale 1-2 puffs into the lungs every 6 (six) hours as needed for wheezing or shortness of breath. 07/28/16   Hedges, Dellis Filbert, PA-C  amoxicillin-clavulanate (AUGMENTIN) 500-125 MG tablet Take 1 tablet (500 mg total) by mouth 2 (two) times daily with a meal. 05/30/17   Debbe Odea, MD  azithromycin (ZITHROMAX) 500 MG tablet Take 1 tablet (500 mg total) by mouth daily. 05/31/17   Debbe Odea, MD  cholecalciferol (VITAMIN D) 1000 units tablet Take 2,000 Units by mouth daily.     [provider]  dextromethorphan-guaiFENesin (MUCINEX DM) 30-600 MG 12hr tablet Take 1 tablet by mouth 2 (two) times daily. 05/30/17   Debbe Odea, MD  feeding supplement (BOOST / RESOURCE BREEZE) LIQD Take 1 Container by mouth 3 (three) times daily between meals. Patient taking differently: Take 1 Container by mouth daily.  08/14/16   Regalado, Belkys A, MD  ferrous sulfate 325 (65 FE) MG tablet Take 325 mg by mouth daily with breakfast.    [provider]  iron polysaccharides (NU-IRON) 150 MG capsule Take 1 capsule (150 mg total) by mouth daily. Patient not taking: Reported  on 05/27/2017 08/14/16   Regalado, Jerald Kief A, MD  LORazepam (ATIVAN) 1 MG tablet Take 1 mg by mouth 2 (two) times daily as needed for anxiety.     [provider]  nicotine (NICODERM CQ - DOSED IN MG/24 HOURS) 21 mg/24hr patch Place 1 patch (21 mg total) onto the skin daily. 05/31/17   Debbe Odea, MD  pantoprazole (PROTONIX) 40 MG tablet Take 1 tablet (40 mg total) by mouth 2 (two) times  daily before a meal. Patient taking differently: Take 40 mg by mouth daily.  08/14/16   Regalado, Belkys A, MD  polyethylene glycol (MIRALAX / GLYCOLAX) packet Take 17 g by mouth daily. Patient taking differently: Take 17 g by mouth daily as needed (constipation). Mix in 8 oz liquid and drink 08/15/16   Regalado, Belkys A, MD  Probiotic Product (PROBIOTIC PO) Take 1 tablet by mouth daily.    [provider]  saccharomyces boulardii (FLORASTOR) 250 MG capsule Take 1 capsule (250 mg total) by mouth 2 (two) times daily. Patient not taking: Reported on 05/27/2017 08/14/16   Elmarie Shiley, MD    Physical Exam: Vitals:   10/13/17 1746 10/13/17 1815 10/13/17 1845 10/13/17 1900  BP: (!) 111/54 (!) 100/53 (!) 102/47 (!) 101/50  Pulse: 81 82 83 78  Resp: (!) 21 (!) 23 (!) 28 17  Temp:      TempSrc:      SpO2: 94% 92% 93% 95%  Weight:      Height:          Constitutional: NAD, calm, comfortable Vitals:   10/13/17 1746 10/13/17 1815 10/13/17 1845 10/13/17 1900  BP: (!) 111/54 (!) 100/53 (!) 102/47 (!) 101/50  Pulse: 81 82 83 78  Resp: (!) 21 (!) 23 (!) 28 17  Temp:      TempSrc:      SpO2: 94% 92% 93% 95%  Weight:      Height:       Eyes: PERRL, lids and conjunctivae normal ENMT: Mucous membranes are moist. Posterior pharynx clear of any exudate or lesions.Normal dentition.  Neck: normal, supple, no masses, no thyromegaly Respiratory: decreased air entry at the bases with crackles. No accessory muscle use.  Cardiovascular: Regular rate and rhythm, no murmurs / rubs / gallops. No extremity edema. 2+ pedal pulses. No carotid bruits.  Abdomen: no tenderness, no masses palpated. No hepatosplenomegaly. Bowel sounds positive.  Musculoskeletal: no clubbing / cyanosis. No joint deformity upper and lower extremities. Good ROM, no contractures. Normal muscle tone.  Skin: no rashes, lesions, ulcers. No induration Neurologic: CN 2-12 grossly intact. Sensation intact, DTR normal.  Strength 5/5 in all 4.  Psychiatric: Normal judgment and insight. Alert and oriented x 3. Normal mood.    Labs on Admission: I have personally reviewed following labs and imaging studies  CBC: Recent Labs  Lab 10/13/17 1305  WBC 18.5*  HGB 10.3*  HCT 32.1*  MCV 92.2  PLT 703   Basic Metabolic Panel: Recent Labs  Lab 10/13/17 1305  NA 130*  K 4.5  CL 91*  CO2 29  GLUCOSE 146*  BUN 8  CREATININE 0.94  CALCIUM 8.8*   GFR: Estimated Creatinine Clearance: 33 mL/min (by C-G formula based on SCr of 0.94 mg/dL). Liver Function Tests: No results for input(s): AST, ALT, ALKPHOS, BILITOT, PROT, ALBUMIN in the last 168 hours. No results for input(s): LIPASE, AMYLASE in the last 168 hours. No results for input(s): AMMONIA in the last 168 hours. Coagulation Profile: No  results for input(s): INR, PROTIME in the last 168 hours. Cardiac Enzymes: No results for input(s): CKTOTAL, CKMB, CKMBINDEX, TROPONINI in the last 168 hours. BNP (last 3 results) No results for input(s): PROBNP in the last 8760 hours. HbA1C: No results for input(s): HGBA1C in the last 72 hours. CBG: No results for input(s): GLUCAP in the last 168 hours. Lipid Profile: No results for input(s): CHOL, HDL, LDLCALC, TRIG, CHOLHDL, LDLDIRECT in the last 72 hours. Thyroid Function Tests: No results for input(s): TSH, T4TOTAL, FREET4, T3FREE, THYROIDAB in the last 72 hours. Anemia Panel: No results for input(s): VITAMINB12, FOLATE, FERRITIN, TIBC, IRON, RETICCTPCT in the last 72 hours. Urine analysis:    Component Value Date/Time   COLORURINE YELLOW 05/27/2017 1953   APPEARANCEUR HAZY (A) 05/27/2017 1953   LABSPEC 1.025 05/27/2017 1953   PHURINE 5.0 05/27/2017 1953   GLUCOSEU NEGATIVE 05/27/2017 1953   HGBUR NEGATIVE 05/27/2017 1953   BILIRUBINUR NEGATIVE 05/27/2017 1953   KETONESUR NEGATIVE 05/27/2017 1953   PROTEINUR 100 (A) 05/27/2017 1953   UROBILINOGEN 1.0 08/11/2014 2239   NITRITE NEGATIVE 05/27/2017  1953   LEUKOCYTESUR NEGATIVE 05/27/2017 1953   Sepsis Labs: @LABRCNTIP (procalcitonin:4,lacticidven:4) )No results found for this or any previous visit (from the past 240 hour(s)).   Radiological Exams on Admission: Dg Chest 2 View  Result Date: 10/13/2017 CLINICAL DATA:  Cough and fever for 1 week. EXAM: CHEST - 2 VIEW COMPARISON:  05/27/2017 and prior chest radiographs FINDINGS: The cardiomediastinal silhouette is unchanged. Mild patchy opacities within the anterior mid lung on the LATERAL view likely represents pneumonia. COPD/emphysema changes again noted. No pleural effusion or pneumothorax. Thoracic/lumbar compression fractures and vertebral augmentation changes again noted. A large hiatal hernia is again noted. IMPRESSION: Mild patchy opacities within the RIGHT middle lobe versus lingula on the LATERAL view likely representing pneumonia. Follow-up to resolution recommended. COPD/emphysema. Large hiatal hernia. Electronically Signed   By: Margarette Canada M.D.   On: 10/13/2017 14:04    EKG: Independently reviewed. Normal sinus rhythm with a rate of 90. Normal ST-T wave  Assessment/Plan Principal Problem:   Community acquired pneumonia Active Problems:   Normocytic anemia   GERD (gastroesophageal reflux disease)   Protein-calorie malnutrition, severe ([facility])   Leucocytosis   Pneumonia   #1 community-acquired pneumonia: Patient has had recurrent pneumonias the last one was in December of last year. We will admit the patient start her on IV Rocephin and Zithromax. Oxygen and supportive care. Due to her age and pneumonia mortality index is high.  #2 GERD: Continue with PPIs.  #3 normocytic anemia: H&H appears to be at her baseline. Continue monitoring  #4 leukocytosis: Most likely due to pneumonia. Continue to monitor.  #5 hyponatremia: Continue hydration.  #6 severe protein calorie malnutrition:  Continue ensure supplementation.   DVT prophylaxis: Lovenox  Code Status: full code   Family Communication: Son, Ronalee Belts at bedside  Disposition Plan: To be determined  Consults called: None  Admission status: inpatient  Severity of Illness: The appropriate patient status for this patient is INPATIENT. Inpatient status is judged to be reasonable and necessary in order to provide the required intensity of service to ensure the patient's safety. The patient's presenting symptoms, physical exam findings, and initial radiographic and laboratory data in the context of their chronic comorbidities is felt to place them at high risk for further clinical deterioration. Furthermore, it is not anticipated that the patient will be medically stable for discharge from the hospital within 2 midnights of admission. The following factors  support the patient status of inpatient.   " The patient's presenting symptoms include Shortness of breath. " The worrisome physical exam findings include basal crackles. " The initial radiographic and laboratory data are worrisome because of chest x-ray showing early pneumonia. " The chronic co-morbidities include recurrent pneumonia with COPD and atrial fibrillation.   * I certify that at the point of admission it is my clinical judgment that the patient will require inpatient hospital care spanning beyond 2 midnights from the point of admission due to high intensity of service, high risk for further deterioration and high frequency of surveillance required.Barbette Merino MD [facility] Pager [phone]   If 7PM-7AM, please contact night-coverage [email or web address] Password [facility]  10/13/2017, 7:35 PM

## 2017-10-14 DIAGNOSIS — E43 Unspecified severe protein-calorie malnutrition: Secondary | ICD-10-CM

## 2017-10-14 DIAGNOSIS — K219 Gastro-esophageal reflux disease without esophagitis: Secondary | ICD-10-CM

## 2017-10-14 DIAGNOSIS — D649 Anemia, unspecified: Secondary | ICD-10-CM

## 2017-10-14 DIAGNOSIS — J181 Lobar pneumonia, unspecified organism: Secondary | ICD-10-CM

## 2017-10-14 LAB — COMPREHENSIVE METABOLIC PANEL
ALK PHOS: 106 U/L (ref 38–126)
ALT: 14 U/L (ref 14–54)
ANION GAP: 9 (ref 5–15)
AST: 19 U/L (ref 15–41)
Albumin: 2.1 g/dL — ABNORMAL LOW (ref 3.5–5.0)
BUN: 10 mg/dL (ref 6–20)
CALCIUM: 8 mg/dL — AB (ref 8.9–10.3)
CO2: 27 mmol/L (ref 22–32)
CREATININE: 0.77 mg/dL (ref 0.44–1.00)
Chloride: 96 mmol/L — ABNORMAL LOW (ref 101–111)
GFR calc Af Amer: >60 mL/min (ref >60)
Glucose, Bld: 121 mg/dL — ABNORMAL HIGH (ref 65–99)
Potassium: 4 mmol/L (ref 3.5–5.1)
SODIUM: 132 mmol/L — AB (ref 135–145)
TOTAL PROTEIN: 6 g/dL — AB (ref 6.5–8.1)
Total Bilirubin: 0.5 mg/dL (ref 0.3–1.2)

## 2017-10-14 LAB — CBC WITH DIFFERENTIAL/PLATELET
ABS IMMATURE GRANULOCYTES: 0.2 10*3/uL — AB (ref 0.0–0.1)
BASOS ABS: 0.1 10*3/uL (ref 0.0–0.1)
BASOS PCT: 0 %
EOS ABS: 0 10*3/uL (ref 0.0–0.7)
Eosinophils Relative: 0 %
HCT: 27.9 % — ABNORMAL LOW (ref 36.0–46.0)
Hemoglobin: 8.9 g/dL — ABNORMAL LOW (ref 12.0–15.0)
IMMATURE GRANULOCYTES: 1 %
Lymphocytes Relative: 5 %
Lymphs Abs: 0.9 10*3/uL (ref 0.7–4.0)
MCH: 29.8 pg (ref 26.0–34.0)
MCHC: 31.9 g/dL (ref 30.0–36.0)
MCV: 93.3 fL (ref 78.0–100.0)
MONOS PCT: 7 %
Monocytes Absolute: 1.2 10*3/uL — ABNORMAL HIGH (ref 0.1–1.0)
NEUTROS ABS: 14.7 10*3/uL — AB (ref 1.7–7.7)
NEUTROS PCT: 87 %
PLATELETS: 286 10*3/uL (ref 150–400)
RBC: 2.99 MIL/uL — AB (ref 3.87–5.11)
RDW: 12.7 % (ref 11.5–15.5)
WBC: 16.9 10*3/uL — ABNORMAL HIGH (ref 4.0–10.5)

## 2017-10-14 LAB — HIV ANTIBODY (ROUTINE TESTING W REFLEX): HIV Screen 4th Generation wRfx: NONREACTIVE

## 2017-10-14 LAB — STREP PNEUMONIAE URINARY ANTIGEN: Strep Pneumo Urinary Antigen: NEGATIVE

## 2017-10-14 LAB — PROCALCITONIN: Procalcitonin: 0.19 ng/mL

## 2017-10-14 MED ORDER — IPRATROPIUM-ALBUTEROL 0.5-2.5 (3) MG/3ML IN SOLN: 3.0000 mL | RESPIRATORY_TRACT | Status: DC | PRN

## 2017-10-14 MED ORDER — ACETAMINOPHEN 325 MG PO TABS
650.0000 mg | ORAL_TABLET | Freq: Four times a day (QID) | ORAL | Status: DC | PRN
  Administered 2017-10-14 – 2017-10-16 (×3): 650 mg via ORAL
  Filled 2017-10-14 (×3): qty 2

## 2017-10-14 NOTE — Progress Notes (Signed)
Patient's 0013 vitals had a BP of 95/41 (57) rechecked at 0018 and BP is 92/41 (56). Patient's is asymptomatic. XKennon Holter notified of low running blood pressure. No new orders received. Will continue to assess and monitor patient's BP.

## 2017-10-14 NOTE — Progress Notes (Addendum)
PROGRESS NOTE    Mackenzie Mendoza  DQQ:[id number] DOB: Apr 01, 1934 DOA: 10/13/2017 PCP: Lavone Orn, MD   Brief Narrative:  84 year old with history of tobacco use, COPD, previous pneumonias comes to the hospital complains of shortness of breath and cough.  Upon admission she was noted to have some infiltrate in the right middle lobe with elevated WBC therefore admitted to the hospital with concerns of community-acquired pneumonia.  She was started on Rocephin and azithromycin.   Assessment & Plan:   Principal Problem:   Community acquired pneumonia Active Problems:   Normocytic anemia   GERD (gastroesophageal reflux disease)   Protein-calorie malnutrition, severe (HCC)   Leucocytosis   Pneumonia   Acute respiratory distress, improved Sepsis secondary to community-acquired pneumonia- RML - Patient is currently on IV Rocephin and azithromycin. -We will add bronchodilators to be used as necessary -I am concerned that she could be having some aspiration therefore we will get speech and swallow to see her.  Iron deficiency anemia -Hemoglobin remained stable.  Continue ferrous sulfate 225 mg orally daily  GERD -Continue PPI  Leukocytosis -Secondary to underlying infection  Severe protein calorie malnutrition -Nutrition consult, boost has been added   DVT prophylaxis: Lovenox Code Status: Full Code  Family Communication: None at Bedside   Disposition Plan: Maintain in patient stay.   Consultants:   None   Procedures:   None   Antimicrobials:   Azithromycin 5/18>>  Rocephin 5/18 >>   Subjective: No complaints this morning.  She is ambulating well in the room.  Feels a little better  Review of Systems Otherwise negative except as per HPI, including: General: Denies fever, chills, night sweats or unintended weight loss. Resp: Denies  wheezing Cardiac: Denies chest pain, palpitations, orthopnea, paroxysmal nocturnal dyspnea. GI: Denies abdominal pain,  nausea, vomiting, diarrhea or constipation GU: Denies dysuria, frequency, hesitancy or incontinence MS: Denies muscle aches, joint pain or swelling Neuro: Denies headache, neurologic deficits (focal weakness, numbness, tingling), abnormal gait Psych: Denies anxiety, depression, SI/HI/AVH Skin: Denies new rashes or lesions ID: Denies sick contacts, exotic exposures, travel  Objective: Vitals:   10/14/17 0037 10/14/17 0037 10/14/17 0150 10/14/17 0808  BP: (!) 97/47 (!) 97/47 (!) 103/51 (!) 87/51  Pulse: 89 91  92  Resp:    20  Temp:    99.1 F (37.3 C)  TempSrc:    Oral  SpO2: 100% 100% 93% 90%  Weight:      Height:        Intake/Output Summary (Last 24 hours) at 10/14/2017 1102 Last data filed at 10/14/2017 1941 Gross per 24 hour  Intake 1425 ml  Output 200 ml  Net 1225 ml   Filed Weights   10/13/17 1303  Weight: 44.5 kg (98 lb)    Examination:  General exam: Appears calm and comfortable; elderly frail appearing.   Respiratory system: diminished BS at the bases  Cardiovascular system: S1 & S2 heard, RRR. No JVD, murmurs, rubs, gallops or clicks. No pedal edema. Gastrointestinal system: Abdomen is nondistended, soft and nontender. No organomegaly or masses felt. Normal bowel sounds heard. Central nervous system: Alert and oriented. No focal neurological deficits. Extremities: Symmetric 5 x 5 power. Skin: No rashes, lesions or ulcers Psychiatry: Judgement and insight appear normal. Mood & affect appropriate.   Data Reviewed:   CBC: Recent Labs  Lab 10/13/17 1305 10/14/17 0302  WBC 18.5* 16.9*  NEUTROABS  --  14.7*  HGB 10.3* 8.9*  HCT 32.1* 27.9*  MCV 92.2 93.3  PLT 372 443   Basic Metabolic Panel: Recent Labs  Lab 10/13/17 1305 10/14/17 0302  NA 130* 132*  K 4.5 4.0  CL 91* 96*  CO2 29 27  GLUCOSE 146* 121*  BUN 8 10  CREATININE 0.94 0.77  CALCIUM 8.8* 8.0*   GFR: Estimated Creatinine Clearance: 38.7 mL/min (by C-G formula based on SCr of 0.77  mg/dL). Liver Function Tests: Recent Labs  Lab 10/14/17 0302  AST 19  ALT 14  ALKPHOS 106  BILITOT 0.5  PROT 6.0*  ALBUMIN 2.1*   No results for input(s): LIPASE, AMYLASE in the last 168 hours. No results for input(s): AMMONIA in the last 168 hours. Coagulation Profile: No results for input(s): INR, PROTIME in the last 168 hours. Cardiac Enzymes: No results for input(s): CKTOTAL, CKMB, CKMBINDEX, TROPONINI in the last 168 hours. BNP (last 3 results) No results for input(s): PROBNP in the last 8760 hours. HbA1C: No results for input(s): HGBA1C in the last 72 hours. CBG: No results for input(s): GLUCAP in the last 168 hours. Lipid Profile: No results for input(s): CHOL, HDL, LDLCALC, TRIG, CHOLHDL, LDLDIRECT in the last 72 hours. Thyroid Function Tests: No results for input(s): TSH, T4TOTAL, FREET4, T3FREE, THYROIDAB in the last 72 hours. Anemia Panel: No results for input(s): VITAMINB12, FOLATE, FERRITIN, TIBC, IRON, RETICCTPCT in the last 72 hours. Sepsis Labs: Recent Labs  Lab 10/13/17 1814 10/14/17 0756  PROCALCITON  --  0.19  LATICACIDVEN 1.07  --     No results found for this or any previous visit (from the past 240 hour(s)).       Radiology Studies: Dg Chest 2 View  Result Date: 10/13/2017 CLINICAL DATA:  Cough and fever for 1 week. EXAM: CHEST - 2 VIEW COMPARISON:  05/27/2017 and prior chest radiographs FINDINGS: The cardiomediastinal silhouette is unchanged. Mild patchy opacities within the anterior mid lung on the LATERAL view likely represents pneumonia. COPD/emphysema changes again noted. No pleural effusion or pneumothorax. Thoracic/lumbar compression fractures and vertebral augmentation changes again noted. A large hiatal hernia is again noted. IMPRESSION: Mild patchy opacities within the RIGHT middle lobe versus lingula on the LATERAL view likely representing pneumonia. Follow-up to resolution recommended. COPD/emphysema. Large hiatal hernia.  Electronically Signed   By: Margarette Canada M.D.   On: 10/13/2017 14:04        Scheduled Meds: . cholecalciferol  2,000 Units Oral Daily  . dextromethorphan-guaiFENesin  1 tablet Oral BID  . enoxaparin (LOVENOX) injection  30 mg Subcutaneous Q24H  . feeding supplement  1 Container Oral Daily  . ferrous sulfate  325 mg Oral Q breakfast  . nicotine  21 mg Transdermal Daily  . pantoprazole  40 mg Oral Daily   Continuous Infusions: . sodium chloride 75 mL/hr at 10/13/17 2216  . azithromycin Stopped (10/14/17 0019)  . cefTRIAXone (ROCEPHIN)  IV Stopped (10/13/17 2246)     LOS: 1 day    I have spent 35 minutes face to face with the patient and on the ward discussing the patients care, assessment, plan and disposition with other care givers. >50% of the time was devoted counseling the patient about the risks and benefits of treatment and coordinating care.     Joshu Furukawa Arsenio Loader, MD [facility] Pager [phone]   If 7PM-7AM, please contact night-coverage [email or web address] Password [email or web address] 10/14/2017, 11:02 AM

## 2017-10-14 NOTE — Evaluation (Signed)
Clinical/Bedside Swallow Evaluation Patient Details  Name: Mackenzie Mendoza MRN: [id number] Date of Birth: [birth year]  Today's Date: 10/14/2017 Time: SLP Start Time (ACUTE ONLY): 0951 SLP Stop Time (ACUTE ONLY): 1010 SLP Time Calculation (min) (ACUTE ONLY): 19 min  Past Medical History:  Past Medical History:  Diagnosis Date  . [facility])    Past Surgical History:  Past Surgical History:  Procedure Laterality Date  . ABDOMINAL HYSTERECTOMY    . CHOLECYSTECTOMY    . PANCREATICODUODENECTOMY     [facility].  Neoadj chemoXRT, Whipple 2007-2008   HPI:  Pt is an 84 y.o. female admitted with recurrent PNA. 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 BSE completed 05/29/17 recommended Dys 3 diet and thin liquids with no overt signs of aspiration although pt with missing dentition, risk factors including GI issues, respiratory status.    Assessment / Plan / Recommendation Clinical Impression  Pt's oropharyngeal swallow appears functional, although this is her second episode of PNA this year and she does have risk factors for silent aspiration including esophageal issues and COPD. Discussed with pt the option of pursuing MBS for closer assessment. For now would keep pt on current diet with use of general aspiration and esophageal precautions, which were reviewed. Will f/u for tolerance and possible MBS if pt agrees.  SLP Visit Diagnosis: Dysphagia, unspecified (R13.10)    Aspiration Risk  Mild aspiration risk    Diet Recommendation Regular;Thin liquid   Liquid Administration via: Cup;Straw Medication Administration: Whole meds with liquid Supervision: Patient able to self feed;Intermittent supervision to cue for compensatory strategies Compensations: Small sips/bites;Slow rate;Follow solids with liquid Postural Changes: Seated upright at 90 degrees;Remain upright for at least 30 minutes after po  intake    Other  Recommendations Recommended Consults: Consider esophageal assessment Oral Care Recommendations: Oral care BID   Follow up Recommendations None      Frequency and Duration min 2x/week  1 week       Prognosis        Swallow Study   General HPI: Pt is an 84 y.o. female admitted with recurrent PNA. 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 BSE completed 05/29/17 recommended Dys 3 diet and thin liquids with no overt signs of aspiration although pt with missing dentition, risk factors including GI issues, respiratory status.  Type of Study: Bedside Swallow Evaluation Previous Swallow Assessment: see HPI Diet Prior to this Study: Regular;Thin liquids Temperature Spikes Noted: Yes(100.4) Respiratory Status: Room air History of Recent Intubation: No Behavior/Cognition: Alert;Cooperative;Pleasant mood Oral Cavity Assessment: Other (comment)(coating on tongue) Oral Care Completed by SLP: No Oral Cavity - Dentition: Missing dentition Vision: Functional for self-feeding Self-Feeding Abilities: Able to feed self Patient Positioning: Upright in bed Baseline Vocal Quality: Normal Volitional Cough: Strong Volitional Swallow: Able to elicit    Oral/Motor/Sensory Function Overall Oral Motor/Sensory Function: Within functional limits   Ice Chips Ice chips: Not tested   Thin Liquid Thin Liquid: Within functional limits Presentation: Cup;Self Fed;Straw    Nectar Thick Nectar Thick Liquid: Not tested   Honey Thick Honey Thick Liquid: Not tested   Puree Puree: Within functional limits Presentation: Self Fed;Spoon   Solid   GO   Solid: Within functional limits Presentation: Self Ennis Forts 10/14/2017,11:09 AM  Germain Osgood, M.A. CCC-SLP [phone]

## 2017-10-15 DIAGNOSIS — R062 Wheezing: Secondary | ICD-10-CM

## 2017-10-15 LAB — CBC
HCT: 30.7 % — ABNORMAL LOW (ref 36.0–46.0)
Hemoglobin: 9.8 g/dL — ABNORMAL LOW (ref 12.0–15.0)
MCH: 29.2 pg (ref 26.0–34.0)
MCHC: 31.9 g/dL (ref 30.0–36.0)
MCV: 91.4 fL (ref 78.0–100.0)
Platelets: 335 10*3/uL (ref 150–400)
RBC: 3.36 MIL/uL — ABNORMAL LOW (ref 3.87–5.11)
RDW: 12.7 % (ref 11.5–15.5)
WBC: 13.1 10*3/uL — ABNORMAL HIGH (ref 4.0–10.5)

## 2017-10-15 LAB — BASIC METABOLIC PANEL
ANION GAP: 9 (ref 5–15)
BUN: 7 mg/dL (ref 6–20)
CALCIUM: 8.6 mg/dL — AB (ref 8.9–10.3)
CO2: 27 mmol/L (ref 22–32)
Chloride: 100 mmol/L — ABNORMAL LOW (ref 101–111)
Creatinine, Ser: 0.72 mg/dL (ref 0.44–1.00)
GFR calc Af Amer: >60 mL/min (ref >60)
GLUCOSE: 118 mg/dL — AB (ref 65–99)
Potassium: 3.7 mmol/L (ref 3.5–5.1)
SODIUM: 136 mmol/L (ref 135–145)

## 2017-10-15 LAB — MAGNESIUM: MAGNESIUM: 1.9 mg/dL (ref 1.7–2.4)

## 2017-10-15 LAB — PROCALCITONIN: Procalcitonin: 0.13 ng/mL

## 2017-10-15 MED ORDER — ENSURE ENLIVE PO LIQD
237.0000 mL | Freq: Two times a day (BID) | ORAL | Status: DC
  Administered 2017-10-16: 237 mL via ORAL

## 2017-10-15 MED ORDER — AZITHROMYCIN 250 MG PO TABS
500.0000 mg | ORAL_TABLET | Freq: Every day | ORAL | Status: DC
  Administered 2017-10-15: 500 mg via ORAL
  Filled 2017-10-15: qty 2

## 2017-10-15 MED ORDER — PREDNISONE 20 MG PO TABS
50.0000 mg | ORAL_TABLET | Freq: Every day | ORAL | Status: DC
  Administered 2017-10-16: 50 mg via ORAL
  Filled 2017-10-15: qty 2

## 2017-10-15 MED ORDER — ALBUTEROL SULFATE (2.5 MG/3ML) 0.083% IN NEBU: 2.5000 mg | INHALATION_SOLUTION | RESPIRATORY_TRACT | Status: DC | PRN

## 2017-10-15 MED ORDER — IPRATROPIUM-ALBUTEROL 0.5-2.5 (3) MG/3ML IN SOLN
3.0000 mL | Freq: Four times a day (QID) | RESPIRATORY_TRACT | Status: DC
  Administered 2017-10-15: 3 mL via RESPIRATORY_TRACT
  Filled 2017-10-15 (×2): qty 3

## 2017-10-15 NOTE — Consult Note (Addendum)
   [facility] Inpatient Consult   10/15/2017  DONDI BURANDT [birth year] [id number]    Inpatient RNCM requested writer speak with patient regarding [facility] Management services for COPD.  Went to bedside to explain and offer [facility] Care Management program. Mrs. Schul denied having any [facility] Care Management needs at this time. She also declined EMMI COPD calls.  States she lives with her husband (currently in a SNF) and her daughter.  States her son takes her to MD appointments.  Denies having any concerns with medications.  Confirms Primary Care MD is Dr. Laurann Montana (office listed as doing transition of care call post discharge).  Left [facility] Care Management brochure with contact information and 24-hr nurse advice line magnet to call should she change her mind in the future.  Patient is eligible for [facility] First program if needed.   Left voicemail for inpatient RNCM to make aware of above notes.   Marthenia Rolling, MSN-Ed, RN,BSN [facility] Liaison [phone]

## 2017-10-15 NOTE — Progress Notes (Signed)
Initial Nutrition Assessment  DOCUMENTATION CODES:   Severe malnutrition in context of chronic illness, Underweight  INTERVENTION:    Ensure Enlive po BID, each supplement provides 350 kcal and 20 grams of protein  NUTRITION DIAGNOSIS:   Severe Malnutrition related to chronic illness(COPD) as evidenced by severe fat depletion, severe muscle depletion  GOAL:   Patient will meet greater than or equal to 90% of their needs  MONITOR:   PO intake, Supplement acceptance, Labs, Skin, Weight trends, I & O's  REASON FOR ASSESSMENT:   Malnutrition Screening Tool  ASSESSMENT:   84 yo Female with PMH of tobacco use, COPD, previous pneumonias comes to the hospital complains of shortness of breath and cough.   RD spoke with pt at bedside. She reports a decreased appetite.  Ate her yogurt and some of the pancakes for breakfast.  Typically consumes 2-3 meals per day at home. Also drinks one (1) Ensure or Boost supplement per day. She believes her weight has been stable.  S/p bedside swallow evaluation 5/19. Mild aspiration risk. Medications include ferrous sulfate and Vitamin D. Labs reviewed. Glucose, Bld 118 (H).  NUTRITION - FOCUSED PHYSICAL EXAM:    Most Recent Value  Orbital Region  Moderate depletion  Upper Arm Region  Severe depletion  Thoracic and Lumbar Region  Severe depletion  Buccal Region  Moderate depletion  Temple Region  Severe depletion  Clavicle Bone Region  Severe depletion  Clavicle and Acromion Bone Region  Severe depletion  Scapular Bone Region  Severe depletion  Dorsal Hand  Severe depletion  Patellar Region  Severe depletion  Anterior Thigh Region  Severe depletion  Posterior Calf Region  Severe depletion    Diet Order:   Diet Order           Diet Heart Room service appropriate? Yes; Fluid consistency: Thin  Diet effective now         EDUCATION NEEDS:   No education needs have been identified at this time  Skin:  Skin Assessment: Reviewed  RN Assessment  Last BM:  5/15  Height:   Ht Readings from Last 1 Encounters:  10/13/17 5\' 8"  (1.727 m)   Weight:   Wt Readings from Last 1 Encounters:  10/13/17 98 lb (44.5 kg)   BMI:  Body mass index is 14.9 kg/m.  Estimated Nutritional Needs:   Kcal:  1300-1500  Protein:  65-80 gm  Fluid:  >/= 1.5 L   Arthur Holms, RD, LDN Pager #: [phone] After-Hours Pager #: [phone]

## 2017-10-15 NOTE — Progress Notes (Signed)
PROGRESS NOTE    Mackenzie Mendoza  AYT:[id number] DOB: 1933/12/01 DOA: 10/13/2017 PCP: Lavone Orn, MD   Brief Narrative:  84 year old with history of tobacco use, COPD, previous pneumonias comes to the hospital complains of shortness of breath and cough.  Upon admission she was noted to have some infiltrate in the right middle lobe with elevated WBC therefore admitted to the hospital with concerns of community-acquired pneumonia.  She was started on Rocephin and azithromycin.   Assessment & Plan:   Principal Problem:   Community acquired pneumonia Active Problems:   Normocytic anemia   GERD (gastroesophageal reflux disease)   Protein-calorie malnutrition, severe (HCC)   Leucocytosis   Pneumonia   Acute respiratory distress, improved Sepsis secondary to community-acquired pneumonia- RML - Patient is currently on IV Rocephin and azithromycin. -Continue patient on current antibiotics including bronchodilators.  I will add a short course of oral prednisone as she is having some expiratory wheezing. -Patient did well with speech and swallow therapy. -Ambulatory pulse ox showed oxygen saturation greater than 90% on room air  Iron deficiency anemia -Hemoglobin remained stable.  Continue ferrous sulfate 325 mg orally daily  GERD -Continue PPI  Leukocytosis -Secondary to underlying infection  Severe protein calorie malnutrition -Nutrition consult, boost has been added   DVT prophylaxis: Lovenox Code Status: Full Code  Family Communication: None at Bedside   Disposition Plan: Acute inpatient stay for another 24 hours as patient is still having quite a bit of expiratory wheezing and given her age and comorbidities I would like her to do better before she can be discharged.  Consultants:   None   Procedures:   None   Antimicrobials:   Azithromycin 5/18>>  Rocephin 5/18 >>   Subjective: No new complaints this morning.  No acute events overnight, she is ablating  well in her room without any issues.  Review of Systems Otherwise negative except as per HPI, including: HEENT/EYES = negative for pain, redness, loss of vision, double vision, blurred vision, loss of hearing, sore throat, hoarseness, dysphagia Cardiovascular= negative for chest pain, palpitation, murmurs, lower extremity swelling Respiratory/lungs= negative for shortness of breath, cough, hemoptysis, wheezing, mucus production Gastrointestinal= negative for nausea, vomiting,, abdominal pain, melena, hematemesis Genitourinary= negative for Dysuria, Hematuria, Change in Urinary Frequency MSK = Negative for arthralgia, myalgias, Back Pain, Joint swelling  Neurology= Negative for headache, seizures, numbness, tingling  Psychiatry= Negative for anxiety, depression, suicidal and homocidal ideation Allergy/Immunology= Medication/Food allergy as listed  Skin= Negative for Rash, lesions, ulcers, itching   Objective: Vitals:   10/14/17 0808 10/14/17 1132 10/14/17 1710 10/15/17 0735  BP: (!) 87/51 (!) 98/49 (!) 99/54 (!) 106/56  Pulse: 92 91 80 81  Resp: 20 16 17 18   Temp: 99.1 F (37.3 C) 98.8 F (37.1 C) 99.4 F (37.4 C) 99.5 F (37.5 C)  TempSrc: Oral Oral Oral Oral  SpO2: 90% 91% 95% 92%  Weight:      Height:        Intake/Output Summary (Last 24 hours) at 10/15/2017 1108 Last data filed at 10/15/2017 1000 Gross per 24 hour  Intake 2795 ml  Output 1150 ml  Net 1645 ml   Filed Weights   10/13/17 1303  Weight: 44.5 kg (98 lb)    Examination: Constitutional: NAD, calm, comfortable, elderly frail-appearing Eyes: PERRL, lids and conjunctivae normal ENMT: Mucous membranes are moist. Posterior pharynx clear of any exudate or lesions.Normal dentition.  Neck: normal, supple, no masses, no thyromegaly Respiratory: Mild diffuse expiratory wheezing Cardiovascular: Regular  rate and rhythm, no murmurs / rubs / gallops. No extremity edema. 2+ pedal pulses. No carotid bruits.  Abdomen: no  tenderness, no masses palpated. No hepatosplenomegaly. Bowel sounds positive.  Musculoskeletal: no clubbing / cyanosis. No joint deformity upper and lower extremities. Good ROM, no contractures. Normal muscle tone.  Skin: no rashes, lesions, ulcers. No induration Neurologic: CN 2-12 grossly intact. Sensation intact, DTR normal. Strength 5/5 in all 4.  Psychiatric: Normal judgment and insight. Alert and oriented x 3. Normal mood.   Data Reviewed:   CBC: Recent Labs  Lab 10/13/17 1305 10/14/17 0302 10/15/17 0351  WBC 18.5* 16.9* 13.1*  NEUTROABS  --  14.7*  --   HGB 10.3* 8.9* 9.8*  HCT 32.1* 27.9* 30.7*  MCV 92.2 93.3 91.4  PLT 372 286 427   Basic Metabolic Panel: Recent Labs  Lab 10/13/17 1305 10/14/17 0302 10/15/17 0351  NA 130* 132* 136  K 4.5 4.0 3.7  CL 91* 96* 100*  CO2 29 27 27   GLUCOSE 146* 121* 118*  BUN 8 10 7   CREATININE 0.94 0.77 0.72  CALCIUM 8.8* 8.0* 8.6*  MG  --   --  1.9   GFR: Estimated Creatinine Clearance: 38.7 mL/min (by C-G formula based on SCr of 0.72 mg/dL). Liver Function Tests: Recent Labs  Lab 10/14/17 0302  AST 19  ALT 14  ALKPHOS 106  BILITOT 0.5  PROT 6.0*  ALBUMIN 2.1*   No results for input(s): LIPASE, AMYLASE in the last 168 hours. No results for input(s): AMMONIA in the last 168 hours. Coagulation Profile: No results for input(s): INR, PROTIME in the last 168 hours. Cardiac Enzymes: No results for input(s): CKTOTAL, CKMB, CKMBINDEX, TROPONINI in the last 168 hours. BNP (last 3 results) No results for input(s): PROBNP in the last 8760 hours. HbA1C: No results for input(s): HGBA1C in the last 72 hours. CBG: No results for input(s): GLUCAP in the last 168 hours. Lipid Profile: No results for input(s): CHOL, HDL, LDLCALC, TRIG, CHOLHDL, LDLDIRECT in the last 72 hours. Thyroid Function Tests: No results for input(s): TSH, T4TOTAL, FREET4, T3FREE, THYROIDAB in the last 72 hours. Anemia Panel: No results for input(s):  VITAMINB12, FOLATE, FERRITIN, TIBC, IRON, RETICCTPCT in the last 72 hours. Sepsis Labs: Recent Labs  Lab 10/13/17 1814 10/14/17 0756 10/15/17 0351  PROCALCITON  --  0.19 0.13  LATICACIDVEN 1.07  --   --     No results found for this or any previous visit (from the past 240 hour(s)).       Radiology Studies: Dg Chest 2 View  Result Date: 10/13/2017 CLINICAL DATA:  Cough and fever for 1 week. EXAM: CHEST - 2 VIEW COMPARISON:  05/27/2017 and prior chest radiographs FINDINGS: The cardiomediastinal silhouette is unchanged. Mild patchy opacities within the anterior mid lung on the LATERAL view likely represents pneumonia. COPD/emphysema changes again noted. No pleural effusion or pneumothorax. Thoracic/lumbar compression fractures and vertebral augmentation changes again noted. A large hiatal hernia is again noted. IMPRESSION: Mild patchy opacities within the RIGHT middle lobe versus lingula on the LATERAL view likely representing pneumonia. Follow-up to resolution recommended. COPD/emphysema. Large hiatal hernia. Electronically Signed   By: Margarette Canada M.D.   On: 10/13/2017 14:04        Scheduled Meds: . cholecalciferol  2,000 Units Oral Daily  . dextromethorphan-guaiFENesin  1 tablet Oral BID  . enoxaparin (LOVENOX) injection  30 mg Subcutaneous Q24H  . feeding supplement  1 Container Oral Daily  . ferrous sulfate  325 mg Oral Q breakfast  . nicotine  21 mg Transdermal Daily  . pantoprazole  40 mg Oral Daily   Continuous Infusions: . sodium chloride 75 mL/hr at 10/15/17 1000  . azithromycin Stopped (10/14/17 2243)  . cefTRIAXone (ROCEPHIN)  IV Stopped (10/14/17 2243)     LOS: 2 days    I have spent 35 minutes face to face with the patient and on the ward discussing the patients care, assessment, plan and disposition with other care givers. >50% of the time was devoted counseling the patient about the risks and benefits of treatment and coordinating care.     Ankit Arsenio Loader, MD [facility] Pager [phone]   If 7PM-7AM, please contact night-coverage [email or web address] Password [email or web address] 10/15/2017, 11:08 AM

## 2017-10-15 NOTE — Progress Notes (Signed)
This patient is receiving the antibiotic azithromycin by the intravenous route. Based on criteria approved by the Pharmacy and Therapeutics Committee, and the Infectious Disease Division, the antibiotic is being converted to equivalent oral dose form. These criteria include: . Patient being treated for a respiratory tract infection, urinary tract infection, cellulitis, or Clostridium Difficile Associated Diarrhea . The patient is not neutropenic and does not exhibit a GI malabsorption state . The patient is eating (either orally or per tube) and/or has been taking other orally administered medications for at least 24 hours. . The patient is improving clinically (physician assessment and a 24-hour Tmax of ? 100.5? F). If you have questions about this conversion, please contact the pharmacy department.   Jens Som, PharmD Clinical phone for 10/15/2017 from 7a-3:30p: [phone] If after 3:30p, please call main pharmacy at: [phone] 10/15/2017 11:29 AM

## 2017-10-15 NOTE — Progress Notes (Addendum)
0715 Bedside shift report. Pt resting in bed, NAD, lines checked and verified. WCTM  0900 Pt medicated and assessed, see flow sheet. Pt denies pain, SOB. Updated with POC. No needs at this time. WCTM.   1000 Walking pulse ox performed with nurse tech. Pt walking hallways.   1230 Pt sitting on side of bed eating lunch, NAD, no complaints at this time.   1410 Pt medicated for left back pain, repositioned in bed.   10 Pt called RN and asked for ativan for sleep. Medicated per MAR. WCTM.

## 2017-10-15 NOTE — Progress Notes (Signed)
  Speech Language Pathology Treatment: Dysphagia  Patient Details Name: ELIAS BORDNER MRN: [id number] DOB: February 12, 1934 Today's Date: 10/15/2017 Time: 2774-1287 SLP Time Calculation (min) (ACUTE ONLY): 17 min  Assessment / Plan / Recommendation Clinical Impression  Pt has no overt signs of aspiration with PO trials, but she cannot drink 3 ounces of water consecutively, having to stop intermittently for rest breaks. SLP provided education about her risk factors for silent aspiration and the rationale for the 3 ounce challenge, suggesting MBS to better assess oropharyngeal function. Pt is not interested in pursuing MBS at this time, but she did say she would consider it. Will f/u briefly to check back in on pt's decision and reinforce education/precautions.   HPI HPI: Pt is an 84 y.o. female admitted with recurrent PNA. 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 BSE completed 05/29/17 recommended Dys 3 diet and thin liquids with no overt signs of aspiration although pt with missing dentition, risk factors including GI issues, respiratory status.       SLP Plan  Continue with current plan of care       Recommendations  Diet recommendations: Regular;Thin liquid Liquids provided via: Cup;Straw Medication Administration: Whole meds with liquid Supervision: Patient able to self feed;Intermittent supervision to cue for compensatory strategies Compensations: Small sips/bites;Slow rate;Follow solids with liquid Postural Changes and/or Swallow Maneuvers: Seated upright 90 degrees;Upright 30-60 min after meal                Oral Care Recommendations: Oral care BID Follow up Recommendations: None SLP Visit Diagnosis: Dysphagia, unspecified (R13.10) Plan: Continue with current plan of care       GO                Germain Osgood 10/15/2017, 11:05 AM  Germain Osgood, M.A.  CCC-SLP [phone]

## 2017-10-15 NOTE — Progress Notes (Signed)
SATURATION QUALIFICATIONS: (This note is used to comply with regulatory documentation for home oxygen)  Patient Saturations on Room Air at Rest = 94%  Patient Saturations on Room Air while Ambulating = 93%  Patient Saturations on RA Liters of oxygen while Ambulating = %  Please briefly explain why patient needs home oxygen: Pt does NOT NEED home O2

## 2017-10-16 DIAGNOSIS — D72820 Lymphocytosis (symptomatic): Secondary | ICD-10-CM

## 2017-10-16 LAB — BASIC METABOLIC PANEL
ANION GAP: 6 (ref 5–15)
BUN: <5 mg/dL — ABNORMAL LOW (ref 6–20)
CHLORIDE: 102 mmol/L (ref 101–111)
CO2: 29 mmol/L (ref 22–32)
Calcium: 8.2 mg/dL — ABNORMAL LOW (ref 8.9–10.3)
Creatinine, Ser: 0.71 mg/dL (ref 0.44–1.00)
GFR calc non Af Amer: >60 mL/min (ref >60)
GLUCOSE: 109 mg/dL — AB (ref 65–99)
Potassium: 3.6 mmol/L (ref 3.5–5.1)
Sodium: 137 mmol/L (ref 135–145)

## 2017-10-16 LAB — CBC
HEMATOCRIT: 25.6 % — AB (ref 36.0–46.0)
HEMOGLOBIN: 8.1 g/dL — AB (ref 12.0–15.0)
MCH: 28.9 pg (ref 26.0–34.0)
MCHC: 31.6 g/dL (ref 30.0–36.0)
MCV: 91.4 fL (ref 78.0–100.0)
Platelets: 303 10*3/uL (ref 150–400)
RBC: 2.8 MIL/uL — AB (ref 3.87–5.11)
RDW: 12.7 % (ref 11.5–15.5)
WBC: 9.4 10*3/uL (ref 4.0–10.5)

## 2017-10-16 LAB — MAGNESIUM: Magnesium: 1.7 mg/dL (ref 1.7–2.4)

## 2017-10-16 LAB — PROCALCITONIN: Procalcitonin: <0.1 ng/mL

## 2017-10-16 LAB — LEGIONELLA PNEUMOPHILA SEROGP 1 UR AG: L. pneumophila Serogp 1 Ur Ag: NEGATIVE

## 2017-10-16 MED ORDER — ALBUTEROL SULFATE HFA 108 (90 BASE) MCG/ACT IN AERS: 1.0000 | INHALATION_SPRAY | Freq: Four times a day (QID) | RESPIRATORY_TRACT | 2 refills | Status: DC | PRN

## 2017-10-16 MED ORDER — PREDNISONE 50 MG PO TABS: 50.0000 mg | ORAL_TABLET | Freq: Every day | ORAL | 0 refills | Status: AC

## 2017-10-16 MED ORDER — AMOXICILLIN-POT CLAVULANATE 875-125 MG PO TABS: 1.0000 | ORAL_TABLET | Freq: Two times a day (BID) | ORAL | 0 refills | Status: AC

## 2017-10-16 NOTE — Discharge Summary (Signed)
Physician Discharge Summary  Mackenzie Mendoza NOM:[id number] DOB: 02-07-1934 DOA: 10/13/2017  PCP: Lavone Orn, MD  Admit date: 10/13/2017 Discharge date: 10/16/2017  Admitted From: Home Disposition: Home  Recommendations for Outpatient Follow-up:  1. Follow up with PCP in 1-2 weeks 2. Please obtain BMP/CBC in one week your next doctors visit.  3. Prednisone 50 mg to be taken daily orally for 4 more days 4. Take Augmentin orally twice daily as prescribed for 5 days 5. She is been advised to take her Protonix 40 mg twice daily before her meals  Home Health: None Equipment/Devices: None Discharge Condition: Stable CODE STATUS: Full code Diet recommendation: Regular diet  Brief/Interim Summary: 84 year old with history of tobacco use, COPD, previous pneumonias comes to the hospital complains of shortness of breath and cough.  Upon admission she was noted to have some infiltrate in the right middle lobe with elevated WBC therefore admitted to the hospital with concerns of community-acquired pneumonia.  She was started on Rocephin and azithromycin. Over the course of couple of days patient did well on Rocephin and azithromycin.  The day after her admission she was noted to have expiratory wheezing likely secondary from exacerbation from underlying community-acquired pneumonia therefore started on a short course of oral prednisone.  On the day of discharge her wheezing had resolved.  On ambulatory pulse ox she was saturating greater than 90% on room air.  At this point she is reached maximum benefit from an hospital stay and stable to be discharged with outpatient follow-up recommendations as stated above.    Discharge Diagnoses:  Principal Problem:   Community acquired pneumonia Active Problems:   Normocytic anemia   GERD (gastroesophageal reflux disease)   Protein-calorie malnutrition, severe (HCC)   Leucocytosis   Pneumonia    Acute respiratory distress, resolved Sepsis  secondary to community-acquired pneumonia- RML -  Will be discharged on 5 days of oral Augmentin. -She is advised to use her home inhaler 2-3 times daily for next 4 days and then use it as necessary.  She will also get 4 more days of oral prednisone at home. -Patient did well with speech and swallow therapy without any obvious signs of aspiration. -Ambulatory pulse ox showed oxygen saturation greater than 90% on room air  Iron deficiency anemia -Hemoglobin remained stable, continue home ferrous sulfate 225 mg daily.  She is advised to use over-the-counter bowel regimen if necessary for constipation.  GERD -Continue PPI  Leukocytosis -Secondary to underlying infection  Severe protein calorie malnutrition -Nutrition consult, boost has been added  Lovenox for DVT prophylaxis while she was here Full code  Discharge Instructions   Allergies as of 10/16/2017      Reactions   Adhesive [tape] Other (See Comments)   TAPE TEARS THE SKIN VERY EASILY (it's thin!!)   Aspirin Hives   Codeine-promethazine [promethazine-codeine] Nausea And Vomiting   Estrogens Hives   Phenobarbital Itching      Medication List    TAKE these medications   acetaminophen 500 MG tablet Commonly known as:  TYLENOL Take 1 tablet (500 mg total) by mouth every 6 (six) hours as needed for moderate pain or headache. What changed:  how much to take   albuterol 108 (90 Base) MCG/ACT inhaler Commonly known as:  PROVENTIL HFA;VENTOLIN HFA Inhale 1-2 puffs into the lungs every 6 (six) hours as needed for wheezing or shortness of breath.   amoxicillin-clavulanate 875-125 MG tablet Commonly known as:  AUGMENTIN Take 1 tablet by mouth every 12 (twelve) hours  for 5 days.   cholecalciferol 1000 units tablet Commonly known as:  VITAMIN D Take 2,000 Units by mouth 3 (three) times a week.   citalopram 10 MG tablet Commonly known as:  CELEXA Take 10 mg by mouth daily.   dextromethorphan-guaiFENesin 30-600 MG  12hr tablet Commonly known as:  MUCINEX DM Take 1 tablet by mouth 2 (two) times daily.   feeding supplement Liqd Take 1 Container by mouth 3 (three) times daily between meals. What changed:  when to take this   ferrous sulfate 325 (65 FE) MG tablet Take 325 mg by mouth every other day.   LORazepam 1 MG tablet Commonly known as:  ATIVAN Take 0.5-1 mg by mouth 2 (two) times daily as needed for anxiety.   ondansetron 4 MG disintegrating tablet Commonly known as:  ZOFRAN-ODT Take 4 mg by mouth every 8 (eight) hours as needed for nausea or vomiting.   pantoprazole 40 MG tablet Commonly known as:  PROTONIX Take 1 tablet (40 mg total) by mouth 2 (two) times daily before a meal. What changed:  when to take this   predniSONE 50 MG tablet Commonly known as:  DELTASONE Take 1 tablet (50 mg total) by mouth daily with breakfast for 4 days. Start taking on:  10/17/2017   PROBIOTIC PO Take 1 capsule by mouth daily.      Follow-up Information    Lavone Orn, MD. Schedule an appointment as soon as possible for a visit in 1 week(s).   Specialty:  Internal Medicine Contact information: [address], [address] [place] [address] [phone]          Allergies  Allergen Reactions  . Adhesive [Tape] Other (See Comments)    TAPE TEARS THE SKIN VERY EASILY (it's thin!!)  . Aspirin Hives  . Codeine-Promethazine [Promethazine-Codeine] Nausea And Vomiting  . Estrogens Hives  . Phenobarbital Itching    You were cared for by a hospitalist during your hospital stay. If you have any questions about your discharge medications or the care you received while you were in the hospital after you are discharged, you can call the unit and asked to speak with the hospitalist on call if the hospitalist that took care of you is not available. Once you are discharged, your primary care physician will handle any further medical issues. Please note that no refills for any discharge  medications will be authorized once you are discharged, as it is imperative that you return to your primary care physician (or establish a relationship with a primary care physician if you do not have one) for your aftercare needs so that they can reassess your need for medications and monitor your lab values.  Consultations:  None   Procedures/Studies: Dg Chest 2 View  Result Date: 10/13/2017 CLINICAL DATA:  Cough and fever for 1 week. EXAM: CHEST - 2 VIEW COMPARISON:  05/27/2017 and prior chest radiographs FINDINGS: The cardiomediastinal silhouette is unchanged. Mild patchy opacities within the anterior mid lung on the LATERAL view likely represents pneumonia. COPD/emphysema changes again noted. No pleural effusion or pneumothorax. Thoracic/lumbar compression fractures and vertebral augmentation changes again noted. A large hiatal hernia is again noted. IMPRESSION: Mild patchy opacities within the RIGHT middle lobe versus lingula on the LATERAL view likely representing pneumonia. Follow-up to resolution recommended. COPD/emphysema. Large hiatal hernia. Electronically Signed   By: Margarette Canada M.D.   On: 10/13/2017 14:04      Subjective: Patient was sitting at the bedside during my evaluation in  the morning.  Did not have any complaints, she is able to ambulate to and from the nurses station without any issues.  HEENT/EYES = negative for pain, redness, loss of vision, double vision, blurred vision, loss of hearing, sore throat, hoarseness, dysphagia Cardiovascular= negative for chest pain, palpitation, murmurs, lower extremity swelling Respiratory/lungs= negative for shortness of breath, cough, hemoptysis, wheezing, mucus production Gastrointestinal= negative for nausea, vomiting,, abdominal pain, melena, hematemesis Genitourinary= negative for Dysuria, Hematuria, Change in Urinary Frequency MSK = Negative for arthralgia, myalgias, Back Pain, Joint swelling  Neurology= Negative for headache,  seizures, numbness, tingling  Psychiatry= Negative for anxiety, depression, suicidal and homocidal ideation Allergy/Immunology= Medication/Food allergy as listed  Skin= Negative for Rash, lesions, ulcers, itching   Discharge Exam: Vitals:   10/16/17 0014 10/16/17 0743  BP: (!) 109/53 (!) 114/59  Pulse: 60 77  Resp:  16  Temp: 98.1 F (36.7 C) 98.4 F (36.9 C)  SpO2: 95% 92%   Vitals:   10/15/17 1334 10/15/17 1643 10/16/17 0014 10/16/17 0743  BP:  111/62 (!) 109/53 (!) 114/59  Pulse:  78 60 77  Resp:  (!) 21  16  Temp:  99.1 F (37.3 C) 98.1 F (36.7 C) 98.4 F (36.9 C)  TempSrc:  Oral Oral Oral  SpO2: 95% 94% 95% 92%  Weight:      Height:        General: Pt is alert, awake, not in acute distress Cardiovascular: RRR, S1/S2 +, no rubs, no gallops Respiratory: CTA bilaterally, no wheezing, no rhonchi Abdominal: Soft, NT, ND, bowel sounds + Extremities: no edema, no cyanosis    The results of significant diagnostics from this hospitalization (including imaging, microbiology, ancillary and laboratory) are listed below for reference.     Microbiology: Recent Results (from the past 240 hour(s))  Culture, blood (routine x 2) Call MD if unable to obtain prior to antibiotics being given     Status: None (Preliminary result)   Collection Time: 10/13/17 10:09 PM  Result Value Ref Range Status   Specimen Description BLOOD RIGHT HAND  Final   Special Requests   Final    BOTTLES DRAWN AEROBIC ONLY Blood Culture results may not be optimal due to an inadequate volume of blood received in culture bottles   Culture   Final    NO GROWTH 1 DAY Performed at [facility], [place] [address] [place], [place] [address]    Report Status PENDING  Incomplete  Culture, blood (routine x 2) Call MD if unable to obtain prior to antibiotics being given     Status: None (Preliminary result)   Collection Time: 10/13/17 10:10 PM  Result Value Ref Range Status   Specimen Description BLOOD  LEFT ANTECUBITAL  Final   Special Requests   Final    BOTTLES DRAWN AEROBIC ONLY Blood Culture results may not be optimal due to an inadequate volume of blood received in culture bottles   Culture   Final    NO GROWTH 1 DAY Performed at [facility], [place] [address], [place], [place] [address]    Report Status PENDING  Incomplete     Labs: BNP (last 3 results) Recent Labs    10/13/17 1305  BNP 580.9*   Basic Metabolic Panel: Recent Labs  Lab 10/13/17 1305 10/14/17 0302 10/15/17 0351 10/16/17 0224  NA 130* 132* 136 137  K 4.5 4.0 3.7 3.6  CL 91* 96* 100* 102  CO2 29 27 27 29   GLUCOSE 146* 121*  118* 109*  BUN 8 10 7  <5*  CREATININE 0.94 0.77 0.72 0.71  CALCIUM 8.8* 8.0* 8.6* 8.2*  MG  --   --  1.9 1.7   Liver Function Tests: Recent Labs  Lab 10/14/17 0302  AST 19  ALT 14  ALKPHOS 106  BILITOT 0.5  PROT 6.0*  ALBUMIN 2.1*   No results for input(s): LIPASE, AMYLASE in the last 168 hours. No results for input(s): AMMONIA in the last 168 hours. CBC: Recent Labs  Lab 10/13/17 1305 10/14/17 0302 10/15/17 0351 10/16/17 0224  WBC 18.5* 16.9* 13.1* 9.4  NEUTROABS  --  14.7*  --   --   HGB 10.3* 8.9* 9.8* 8.1*  HCT 32.1* 27.9* 30.7* 25.6*  MCV 92.2 93.3 91.4 91.4  PLT 372 286 335 303   Cardiac Enzymes: No results for input(s): CKTOTAL, CKMB, CKMBINDEX, TROPONINI in the last 168 hours. BNP: Invalid input(s): POCBNP CBG: No results for input(s): GLUCAP in the last 168 hours. D-Dimer No results for input(s): DDIMER in the last 72 hours. Hgb A1c No results for input(s): HGBA1C in the last 72 hours. Lipid Profile No results for input(s): CHOL, HDL, LDLCALC, TRIG, CHOLHDL, LDLDIRECT in the last 72 hours. Thyroid function studies No results for input(s): TSH, T4TOTAL, T3FREE, THYROIDAB in the last 72 hours.  Invalid input(s): FREET3 Anemia work up No results for input(s): VITAMINB12, FOLATE, FERRITIN, TIBC, IRON, RETICCTPCT in the last 72  hours. Urinalysis    Component Value Date/Time   COLORURINE YELLOW 05/27/2017 1953   APPEARANCEUR HAZY (A) 05/27/2017 1953   LABSPEC 1.025 05/27/2017 1953   PHURINE 5.0 05/27/2017 1953   GLUCOSEU NEGATIVE 05/27/2017 1953   HGBUR NEGATIVE 05/27/2017 1953   BILIRUBINUR NEGATIVE 05/27/2017 1953   KETONESUR NEGATIVE 05/27/2017 1953   PROTEINUR 100 (A) 05/27/2017 1953   UROBILINOGEN 1.0 08/11/2014 2239   NITRITE NEGATIVE 05/27/2017 1953   LEUKOCYTESUR NEGATIVE 05/27/2017 1953   Sepsis Labs Invalid input(s): PROCALCITONIN,  WBC,  LACTICIDVEN Microbiology Recent Results (from the past 240 hour(s))  Culture, blood (routine x 2) Call MD if unable to obtain prior to antibiotics being given     Status: None (Preliminary result)   Collection Time: 10/13/17 10:09 PM  Result Value Ref Range Status   Specimen Description BLOOD RIGHT HAND  Final   Special Requests   Final    BOTTLES DRAWN AEROBIC ONLY Blood Culture results may not be optimal due to an inadequate volume of blood received in culture bottles   Culture   Final    NO GROWTH 1 DAY Performed at [facility], [place] [address], [place], [place] [address]    Report Status PENDING  Incomplete  Culture, blood (routine x 2) Call MD if unable to obtain prior to antibiotics being given     Status: None (Preliminary result)   Collection Time: 10/13/17 10:10 PM  Result Value Ref Range Status   Specimen Description BLOOD LEFT ANTECUBITAL  Final   Special Requests   Final    BOTTLES DRAWN AEROBIC ONLY Blood Culture results may not be optimal due to an inadequate volume of blood received in culture bottles   Culture   Final    NO GROWTH 1 DAY Performed at [facility], [place] [address] [place], [place] [address]    Report Status PENDING  Incomplete     Time coordinating discharge:  I have spent 35 minutes face to face with the patient and on the ward discussing the patients  care, assessment, plan and disposition with other care  givers. >50% of the time was devoted counseling the patient about the risks and benefits of treatment/Discharge disposition and coordinating care.   SIGNED:   Damita Lack, MD  [facility] 10/16/2017, 8:51 AM Pager   If 7PM-7AM, please contact night-coverage [email or web address] Password [email or web address]

## 2017-10-16 NOTE — Progress Notes (Addendum)
0383 Bedside shift report. Pt resting in bed, c/o rib pain to bilateral sides. Medicated per MAR. Updated with POC, WCTM.    0930 Pt medicated per MAR, assessed see flow sheet. MD by this AM and discharged pt home. RN taught pt how to use incentive spirometer and flutter valve with teach back and demonstration. Pt able to perform both on own without support. All questions answered, pt able to understand without assistance. WCTM.  37 RN reviewed discharge information with pt, new meds, medication list, and follow up appointments. Pt understands without assistance. All questions answered. IV removed without difficutiy. Pt's son Ronalee Belts called and updated with POC and discharge. He stated he would pick up pt at 11am.   1100 Pt down to car via wheelchair with watch, ring, and clothing. NAD at time of discharge.

## 2017-10-18 ENCOUNTER — Other Ambulatory Visit: Payer: Federal, State, Local not specified - PPO

## 2017-10-19 DIAGNOSIS — E43 Unspecified severe protein-calorie malnutrition: Secondary | ICD-10-CM | POA: Diagnosis not present

## 2017-10-19 DIAGNOSIS — J159 Unspecified bacterial pneumonia: Secondary | ICD-10-CM | POA: Diagnosis not present

## 2017-10-19 DIAGNOSIS — Z720 Tobacco use: Secondary | ICD-10-CM | POA: Diagnosis not present

## 2017-10-19 LAB — CULTURE, BLOOD (ROUTINE X 2)
CULTURE: NO GROWTH
Culture: NO GROWTH

## 2017-11-30 DIAGNOSIS — E43 Unspecified severe protein-calorie malnutrition: Secondary | ICD-10-CM | POA: Diagnosis not present

## 2018-03-27 ENCOUNTER — Other Ambulatory Visit: Payer: Self-pay | Admitting: Internal Medicine

## 2018-03-27 ENCOUNTER — Ambulatory Visit
Admission: RE | Admit: 2018-03-27 | Discharge: 2018-03-27 | Disposition: A | Discharge status: Home / Self Care | Payer: Medicare Other | Source: Ambulatory Visit | Attending: Internal Medicine | Admitting: Internal Medicine

## 2018-03-27 DIAGNOSIS — R197 Diarrhea, unspecified: Secondary | ICD-10-CM | POA: Diagnosis not present

## 2018-03-27 DIAGNOSIS — R509 Fever, unspecified: Secondary | ICD-10-CM | POA: Diagnosis not present

## 2018-03-27 DIAGNOSIS — R05 Cough: Secondary | ICD-10-CM | POA: Diagnosis not present

## 2018-03-29 ENCOUNTER — Emergency Department (HOSPITAL_COMMUNITY): Payer: Medicare Other

## 2018-03-29 ENCOUNTER — Encounter (HOSPITAL_COMMUNITY): Payer: Self-pay

## 2018-03-29 ENCOUNTER — Observation Stay (HOSPITAL_COMMUNITY)
Admission: EM | Admit: 2018-03-29 | Discharge: 2018-03-31 | Disposition: A | Discharge status: Home / Self Care | Payer: Medicare Other | Attending: Internal Medicine | Admitting: Internal Medicine

## 2018-03-29 ENCOUNTER — Other Ambulatory Visit: Payer: Self-pay

## 2018-03-29 DIAGNOSIS — Z79899 Other long term (current) drug therapy: Secondary | ICD-10-CM | POA: Diagnosis not present

## 2018-03-29 DIAGNOSIS — J449 Chronic obstructive pulmonary disease, unspecified: Secondary | ICD-10-CM

## 2018-03-29 DIAGNOSIS — E860 Dehydration: Secondary | ICD-10-CM | POA: Insufficient documentation

## 2018-03-29 DIAGNOSIS — Z885 Allergy status to narcotic agent status: Secondary | ICD-10-CM | POA: Insufficient documentation

## 2018-03-29 DIAGNOSIS — E871 Hypo-osmolality and hyponatremia: Secondary | ICD-10-CM | POA: Diagnosis not present

## 2018-03-29 DIAGNOSIS — J181 Lobar pneumonia, unspecified organism: Secondary | ICD-10-CM | POA: Diagnosis not present

## 2018-03-29 DIAGNOSIS — G4700 Insomnia, unspecified: Secondary | ICD-10-CM | POA: Diagnosis not present

## 2018-03-29 DIAGNOSIS — R1310 Dysphagia, unspecified: Secondary | ICD-10-CM | POA: Insufficient documentation

## 2018-03-29 DIAGNOSIS — F329 Major depressive disorder, single episode, unspecified: Secondary | ICD-10-CM | POA: Insufficient documentation

## 2018-03-29 DIAGNOSIS — F419 Anxiety disorder, unspecified: Secondary | ICD-10-CM | POA: Diagnosis not present

## 2018-03-29 DIAGNOSIS — R05 Cough: Secondary | ICD-10-CM | POA: Diagnosis not present

## 2018-03-29 DIAGNOSIS — R5383 Other fatigue: Secondary | ICD-10-CM | POA: Diagnosis not present

## 2018-03-29 DIAGNOSIS — K219 Gastro-esophageal reflux disease without esophagitis: Secondary | ICD-10-CM | POA: Diagnosis present

## 2018-03-29 DIAGNOSIS — Z681 Body mass index (BMI) 19 or less, adult: Secondary | ICD-10-CM | POA: Insufficient documentation

## 2018-03-29 DIAGNOSIS — J440 Chronic obstructive pulmonary disease with acute lower respiratory infection: Secondary | ICD-10-CM | POA: Insufficient documentation

## 2018-03-29 DIAGNOSIS — E43 Unspecified severe protein-calorie malnutrition: Secondary | ICD-10-CM | POA: Diagnosis not present

## 2018-03-29 DIAGNOSIS — J189 Pneumonia, unspecified organism: Secondary | ICD-10-CM | POA: Diagnosis not present

## 2018-03-29 DIAGNOSIS — F17210 Nicotine dependence, cigarettes, uncomplicated: Secondary | ICD-10-CM | POA: Insufficient documentation

## 2018-03-29 DIAGNOSIS — F32A Depression, unspecified: Secondary | ICD-10-CM | POA: Diagnosis present

## 2018-03-29 DIAGNOSIS — R9431 Abnormal electrocardiogram [ECG] [EKG]: Secondary | ICD-10-CM | POA: Diagnosis not present

## 2018-03-29 LAB — CBC WITH DIFFERENTIAL/PLATELET
Abs Immature Granulocytes: 0.11 10*3/uL — ABNORMAL HIGH (ref 0.00–0.07)
Basophils Absolute: 0 10*3/uL (ref 0.0–0.1)
Basophils Relative: 0 %
EOS ABS: 0 10*3/uL (ref 0.0–0.5)
Eosinophils Relative: 0 %
HEMATOCRIT: 34.6 % — AB (ref 36.0–46.0)
Hemoglobin: 11.2 g/dL — ABNORMAL LOW (ref 12.0–15.0)
IMMATURE GRANULOCYTES: 1 %
LYMPHS ABS: 1.2 10*3/uL (ref 0.7–4.0)
Lymphocytes Relative: 10 %
MCH: 29.7 pg (ref 26.0–34.0)
MCHC: 32.4 g/dL (ref 30.0–36.0)
MCV: 91.8 fL (ref 80.0–100.0)
Monocytes Absolute: 0.9 10*3/uL (ref 0.1–1.0)
Monocytes Relative: 7 %
NEUTROS PCT: 82 %
NRBC: 0 % (ref 0.0–0.2)
Neutro Abs: 10.4 10*3/uL — ABNORMAL HIGH (ref 1.7–7.7)
Platelets: 281 10*3/uL (ref 150–400)
RBC: 3.77 MIL/uL — AB (ref 3.87–5.11)
RDW: 12.6 % (ref 11.5–15.5)
WBC: 12.7 10*3/uL — ABNORMAL HIGH (ref 4.0–10.5)

## 2018-03-29 LAB — RESPIRATORY PANEL BY PCR
Adenovirus: NOT DETECTED
BORDETELLA PERTUSSIS-RVPCR: NOT DETECTED
CORONAVIRUS HKU1-RVPPCR: NOT DETECTED
Chlamydophila pneumoniae: NOT DETECTED
Coronavirus 229E: NOT DETECTED
Coronavirus NL63: NOT DETECTED
Coronavirus OC43: NOT DETECTED
Influenza A: NOT DETECTED
Influenza B: NOT DETECTED
METAPNEUMOVIRUS-RVPPCR: NOT DETECTED
Mycoplasma pneumoniae: NOT DETECTED
PARAINFLUENZA VIRUS 2-RVPPCR: NOT DETECTED
PARAINFLUENZA VIRUS 3-RVPPCR: NOT DETECTED
Parainfluenza Virus 1: NOT DETECTED
Parainfluenza Virus 4: NOT DETECTED
RHINOVIRUS / ENTEROVIRUS - RVPPCR: NOT DETECTED
Respiratory Syncytial Virus: NOT DETECTED

## 2018-03-29 LAB — URINALYSIS, ROUTINE W REFLEX MICROSCOPIC
BILIRUBIN URINE: NEGATIVE
Glucose, UA: NEGATIVE mg/dL
Ketones, ur: 5 mg/dL — AB
Leukocytes, UA: NEGATIVE
NITRITE: NEGATIVE
Protein, ur: NEGATIVE mg/dL
SPECIFIC GRAVITY, URINE: 1.005 (ref 1.005–1.030)
pH: 7 (ref 5.0–8.0)

## 2018-03-29 LAB — STREP PNEUMONIAE URINARY ANTIGEN: Strep Pneumo Urinary Antigen: NEGATIVE

## 2018-03-29 LAB — BASIC METABOLIC PANEL
ANION GAP: 6 (ref 5–15)
BUN: 11 mg/dL (ref 8–23)
CALCIUM: 9.1 mg/dL (ref 8.9–10.3)
CO2: 29 mmol/L (ref 22–32)
Chloride: 99 mmol/L (ref 98–111)
Creatinine, Ser: 0.94 mg/dL (ref 0.44–1.00)
GFR calc non Af Amer: 55 mL/min — ABNORMAL LOW (ref >60)
Glucose, Bld: 109 mg/dL — ABNORMAL HIGH (ref 70–99)
Potassium: 3.9 mmol/L (ref 3.5–5.1)
SODIUM: 134 mmol/L — AB (ref 135–145)

## 2018-03-29 LAB — TROPONIN I: Troponin I: <0.03 ng/mL (ref <0.03)

## 2018-03-29 LAB — I-STAT CG4 LACTIC ACID, ED: Lactic Acid, Venous: 0.98 mmol/L (ref 0.5–1.9)

## 2018-03-29 MED ORDER — SODIUM CHLORIDE 0.9 % IV BOLUS
500.0000 mL | Freq: Once | INTRAVENOUS | Status: AC
  Administered 2018-03-29: 500 mL via INTRAVENOUS

## 2018-03-29 MED ORDER — SODIUM CHLORIDE 0.9 % IV SOLN
2.0000 g | Freq: Once | INTRAVENOUS | Status: AC
  Administered 2018-03-29: 2 g via INTRAVENOUS
  Filled 2018-03-29: qty 2

## 2018-03-29 MED ORDER — ALBUTEROL SULFATE (2.5 MG/3ML) 0.083% IN NEBU: 2.5000 mg | INHALATION_SOLUTION | RESPIRATORY_TRACT | Status: DC | PRN

## 2018-03-29 MED ORDER — BOOST / RESOURCE BREEZE PO LIQD: 1.0000 | Freq: Three times a day (TID) | ORAL | Status: DC

## 2018-03-29 MED ORDER — FERROUS SULFATE 325 (65 FE) MG PO TABS
325.0000 mg | ORAL_TABLET | ORAL | Status: DC
  Administered 2018-03-30: 325 mg via ORAL
  Filled 2018-03-29 (×2): qty 1

## 2018-03-29 MED ORDER — LORAZEPAM 0.5 MG PO TABS
0.5000 mg | ORAL_TABLET | Freq: Two times a day (BID) | ORAL | Status: DC | PRN
  Administered 2018-03-30: 0.5 mg via ORAL
  Administered 2018-03-30: 1 mg via ORAL
  Filled 2018-03-29: qty 1
  Filled 2018-03-29: qty 2

## 2018-03-29 MED ORDER — BOOST / RESOURCE BREEZE PO LIQD CUSTOM
1.0000 | Freq: Three times a day (TID) | ORAL | Status: DC
  Administered 2018-03-29 – 2018-03-30 (×2): 1 via ORAL

## 2018-03-29 MED ORDER — IPRATROPIUM-ALBUTEROL 0.5-2.5 (3) MG/3ML IN SOLN
3.0000 mL | Freq: Once | RESPIRATORY_TRACT | Status: AC
  Administered 2018-03-29: 3 mL via RESPIRATORY_TRACT
  Filled 2018-03-29: qty 3

## 2018-03-29 MED ORDER — ENOXAPARIN SODIUM 30 MG/0.3ML ~~LOC~~ SOLN
30.0000 mg | SUBCUTANEOUS | Status: DC
  Administered 2018-03-29: 30 mg via SUBCUTANEOUS
  Filled 2018-03-29 (×2): qty 0.3

## 2018-03-29 MED ORDER — LEVOFLOXACIN 750 MG PO TABS: 750.0000 mg | ORAL_TABLET | ORAL | Status: DC

## 2018-03-29 MED ORDER — SODIUM CHLORIDE 0.9 % IV SOLN
500.0000 mg | INTRAVENOUS | Status: DC
  Administered 2018-03-29: 500 mg via INTRAVENOUS
  Filled 2018-03-29: qty 500

## 2018-03-29 MED ORDER — DM-GUAIFENESIN ER 30-600 MG PO TB12: 1.0000 | ORAL_TABLET | Freq: Two times a day (BID) | ORAL | Status: DC | PRN

## 2018-03-29 MED ORDER — PANTOPRAZOLE SODIUM 40 MG PO TBEC
40.0000 mg | DELAYED_RELEASE_TABLET | Freq: Every day | ORAL | Status: DC
  Administered 2018-03-30 – 2018-03-31 (×2): 40 mg via ORAL
  Filled 2018-03-29 (×2): qty 1

## 2018-03-29 MED ORDER — VITAMIN D3 25 MCG (1000 UNIT) PO TABS
2000.0000 [IU] | ORAL_TABLET | ORAL | Status: DC
  Administered 2018-03-29: 2000 [IU] via ORAL
  Filled 2018-03-29: qty 2

## 2018-03-29 NOTE — Progress Notes (Signed)
A consult was received from an ED physician for Cefepime per pharmacy dosing.  The patient's profile has been reviewed for ht/wt/allergies/indication/available labs.    A one time order has already been placed by provider for Cefepime 2g IV.  Further antibiotics/pharmacy consults should be ordered by admitting physician if indicated.                       Thank you, Luiz Ochoa 03/29/2018  11:42 AM

## 2018-03-29 NOTE — ED Triage Notes (Signed)
patient reports that she was diagnosed with pneumonia 2 days ago and reports that she is very tired. "I feel like I can' t get up and go." Patient denies an increase of SOB.

## 2018-03-29 NOTE — H&P (Signed)
History and Physical    Mackenzie Mendoza RJJ:[id number] DOB: 1933/08/19 DOA: 03/29/2018  PCP: Lavone Orn, MD Patient coming from: Home  Chief Complaint: Feeling weak  HPI: Mackenzie Mendoza is a 84 y.o. female with medical history significant of COPD, depression, GERD, anxiety.  Patient presented secondary to feeling weak over the last couple of days which has been worsening.  She was diagnosed with pneumonia as an outpatient 2 days ago.  She started on Levaquin and has been taking it for the past 2 days.  She did not take a dose this morning.  She reports a productive cough.  No associated chest pain or dyspnea.  No sick contacts.  She was at home with her husband and daughter.   ED Course: Vitals: Afebrile, normal pulse, normal respirations, normotensive, on room air Labs: WBC of 12.7k, hemoglobin of 11.2 Imaging: Bilateral infiltrates Medications/Course: Cefepime and azithromycin, cultures obtained, 500 mL NS bolus  Review of Systems: Review of Systems  Constitutional: Negative for chills and fever.  Respiratory: Positive for cough and sputum production. Negative for shortness of breath.   Cardiovascular: Negative for chest pain.  Gastrointestinal: Positive for diarrhea (one episode 5 days ago). Negative for abdominal pain, blood in stool, constipation, nausea and vomiting.  Musculoskeletal: Positive for back pain.  All other systems reviewed and are negative.   Past Medical History:  Diagnosis Date  . Cancer [facility])     Past Surgical History:  Procedure Laterality Date  . ABDOMINAL HYSTERECTOMY    . CHOLECYSTECTOMY    . PANCREATICODUODENECTOMY     [facility].  Neoadj chemoXRT, [facility] 2007-2008     reports that she has been smoking cigarettes. She has been smoking about 1.00 pack per day. She has never used smokeless tobacco. She reports that she does not drink alcohol or use drugs.  Allergies  Allergen Reactions  . Adhesive [Tape] Other (See Comments)    TAPE TEARS THE  SKIN VERY EASILY (it's thin!!)  . Aspirin Hives  . Codeine-Promethazine [Promethazine-Codeine] Nausea And Vomiting  . Estrogens Hives  . Phenobarbital Itching    Family History  Problem Relation Age of Onset  . Lung cancer Brother   . Breast cancer Sister     Prior to Admission medications   Medication Sig Start Date End Date Taking? Authorizing Provider  acetaminophen (TYLENOL) 500 MG tablet Take 1 tablet (500 mg total) by mouth every 6 (six) hours as needed for moderate pain or headache. Patient taking differently: Take 1,000 mg by mouth every 6 (six) hours as needed for moderate pain or headache.  08/14/16  Yes Regalado, Belkys A, MD  cholecalciferol (VITAMIN D) 1000 units tablet Take 2,000 Units by mouth 3 (three) times a week.    Yes [provider]  dextromethorphan-guaiFENesin (MUCINEX DM) 30-600 MG 12hr tablet Take 1 tablet by mouth 2 (two) times daily. Patient taking differently: Take 1 tablet by mouth daily.  05/30/17  Yes Debbe Odea, MD  feeding supplement (BOOST / RESOURCE BREEZE) LIQD Take 1 Container by mouth 3 (three) times daily between meals. Patient taking differently: Take 237 mLs by mouth every other day.  08/14/16  Yes Regalado, Belkys A, MD  ferrous sulfate 325 (65 FE) MG tablet Take 325 mg by mouth every other day.    Yes [provider]  levofloxacin (LEVAQUIN) 750 MG tablet Take 750 mg by mouth daily.   Yes [provider]  LORazepam (ATIVAN) 1 MG tablet Take 0.5-1 mg by mouth 2 (two) times  daily as needed for anxiety.    Yes [provider]  pantoprazole (PROTONIX) 40 MG tablet Take 1 tablet (40 mg total) by mouth 2 (two) times daily before a meal. Patient taking differently: Take 40 mg by mouth daily.  08/14/16  Yes Regalado, Belkys A, MD  Probiotic Product (PROBIOTIC PO) Take 1 capsule by mouth daily.    Yes [provider]  albuterol (PROVENTIL HFA;VENTOLIN HFA) 108 (90 Base) MCG/ACT inhaler Inhale 1-2 puffs into the  lungs every 6 (six) hours as needed for wheezing or shortness of breath. 10/16/17 11/15/17  Damita Lack, MD    Physical Exam:  Physical Exam  Constitutional: She is oriented to person, place, and time. She appears well-developed and well-nourished. No distress.  HENT:  Mouth/Throat: Oropharynx is clear and moist.  Eyes: Pupils are equal, round, and reactive to light. Conjunctivae and EOM are normal.  Neck: Normal range of motion.  Cardiovascular: Normal rate, regular rhythm and normal heart sounds.  No murmur heard. Pulmonary/Chest: Effort normal and breath sounds normal. No respiratory distress. She has no wheezes. She has no rales.  Abdominal: Soft. Bowel sounds are normal. She exhibits no distension. There is no tenderness. There is no rebound and no guarding.  Musculoskeletal: Normal range of motion. She exhibits no edema or tenderness.  Lymphadenopathy:    She has no cervical adenopathy.  Neurological: She is alert and oriented to person, place, and time. She has normal strength.  Skin: Skin is warm and dry. She is not diaphoretic.  Psychiatric: She has a normal mood and affect.     Labs on Admission: I have personally reviewed following labs and imaging studies  CBC: Recent Labs  Lab 03/29/18 1046  WBC 12.7*  NEUTROABS 10.4*  HGB 11.2*  HCT 34.6*  MCV 91.8  PLT 510    Basic Metabolic Panel: Recent Labs  Lab 03/29/18 1046  NA 134*  K 3.9  CL 99  CO2 29  GLUCOSE 109*  BUN 11  CREATININE 0.94  CALCIUM 9.1    GFR: Estimated Creatinine Clearance: 32.4 mL/min (by C-G formula based on SCr of 0.94 mg/dL).  Liver Function Tests: No results for input(s): AST, ALT, ALKPHOS, BILITOT, PROT, ALBUMIN in the last 168 hours. No results for input(s): LIPASE, AMYLASE in the last 168 hours. No results for input(s): AMMONIA in the last 168 hours.  Coagulation Profile: No results for input(s): INR, PROTIME in the last 168 hours.  Cardiac Enzymes: Recent Labs  Lab  03/29/18 1046  TROPONINI <0.03    BNP (last 3 results) No results for input(s): PROBNP in the last 8760 hours.  HbA1C: No results for input(s): HGBA1C in the last 72 hours.  CBG: No results for input(s): GLUCAP in the last 168 hours.  Lipid Profile: No results for input(s): CHOL, HDL, LDLCALC, TRIG, CHOLHDL, LDLDIRECT in the last 72 hours.  Thyroid Function Tests: No results for input(s): TSH, T4TOTAL, FREET4, T3FREE, THYROIDAB in the last 72 hours.  Anemia Panel: No results for input(s): VITAMINB12, FOLATE, FERRITIN, TIBC, IRON, RETICCTPCT in the last 72 hours.  Urine analysis:    Component Value Date/Time   COLORURINE YELLOW 03/29/2018 [place] 03/29/2018 1047   LABSPEC 1.005 03/29/2018 1047   PHURINE 7.0 03/29/2018 1047   GLUCOSEU NEGATIVE 03/29/2018 1047   HGBUR SMALL (A) 03/29/2018 1047   BILIRUBINUR NEGATIVE 03/29/2018 1047   KETONESUR 5 (A) 03/29/2018 1047   PROTEINUR NEGATIVE 03/29/2018 1047   UROBILINOGEN 1.0 08/11/2014 2239  NITRITE NEGATIVE 03/29/2018 [place] 03/29/2018 1047     Radiological Exams on Admission: Dg Chest 2 View  Result Date: 03/29/2018 CLINICAL DATA:  Recent diagnosis of pneumonia.  Cough, flank pain. EXAM: CHEST - 2 VIEW COMPARISON:  Chest x-rays dated 03/27/2018 and 07/28/2016. FINDINGS: Patchy opacities bilaterally. Coarse lung markings again noted bilaterally indicating chronic interstitial lung disease and/or chronic bronchitic changes. No pleural effusion or pneumothorax seen. Lungs are hyperexpanded indicating COPD. No acute or suspicious osseous finding. Old compression fracture deformity at the thoracolumbar junction status post vertebroplasty, with associated stable kyphosis. Hiatal hernia again noted, moderate to large in size. IMPRESSION: 1. New patchy opacities bilaterally, suspicious for multifocal pneumonia superimposed on chronic interstitial lung disease/fibrosis. 2. COPD with chronic  interstitial lung disease and/or chronic bronchitic changes. 3. Hiatal hernia, moderate to large. Electronically Signed   By: Franki Cabot M.D.   On: 03/29/2018 11:10    EKG: Independently reviewed. Sinus rhythm  Assessment/Plan Active Problems:   Multifocal pneumonia  Multifocal pneumonia Does not seem like she has failed outpatient management. Fatigue likely a result of infection. No hypoxia, chest pain or dyspnea. Given Cefepime and azithromycin in the ED. -Levaquin 750 mg qOD -Sputum gram stain/culture, urine strep/legionella ag -Respiratory viral panel -PT eval  Anxiety -Continue ativan prn  GERD -Continue Protonix  ILD vs COPD Per patient, has COPD. Imaging suggests interstitial lung disease -Continue albuterol prn -May benefit from pulmonology referral on discharge   DVT prophylaxis: Lovenox Code Status: Full code Family Communication: None at bedside Disposition Plan: Medical floor Consults called: None Admission status: Observation   Cordelia Poche, MD [facility] 03/29/2018, 1:58 PM  If 7PM-7AM, please contact night-coverage [email or web address] Password [email or web address]

## 2018-03-29 NOTE — ED Provider Notes (Signed)
[facility] DEPT Provider Note   CSN: [id number] Arrival date & time: 03/29/18  7829     History   Chief Complaint Chief Complaint  Patient presents with  . Fatigue    HPI Mackenzie Mendoza is a 84 y.o. female.  HPI 84 year old with history of tobacco use, COPD, multiple pneumonia infection comes to the hospital complains of weakness.  Patient reports that she has been feeling fatigued for the last few days and went to her PCP 2 days ago.  Patient was diagnosed with pneumonia and started on antibiotics.  Despite taking the medications, patient states that her symptoms have progressed.  She has been having subjective diaphoresis.  She also has been having dry cough and reports left-sided flank pain.  Her pain is not worse with deep inspiration.  Patient does indicate that she has had some wheezing and she continues to smoke.  Review of system is negative for fevers, chills, chest pain, UTI-like symptoms, abdominal pain, nausea, vomiting.  Past Medical History:  Diagnosis Date  . Cancer [facility])     Patient Active Problem List   Diagnosis Date Noted  . Multifocal pneumonia 03/29/2018  . Community acquired pneumonia 10/13/2017  . Leucocytosis 10/13/2017  . Pneumonia 10/13/2017  . Dehydration   . Protein-calorie malnutrition, severe ([place])   . Sepsis ([place]) 05/27/2017  . Lobar pneumonia ([place]) 05/27/2017  . Nausea & vomiting 05/27/2017  . Hypokalemia 05/27/2017  . GERD (gastroesophageal reflux disease) 05/27/2017  . Abdominal pain 08/09/2016  . Depression 08/09/2016  . Acute respiratory failure with hypoxia ([place]) 08/09/2016  . Weight loss 03/05/2012  . 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 Osteoporosis 09/05/2011  . Normocytic anemia 09/05/2011  . Other B-complex deficiencies 09/05/2011    Past Surgical History:  Procedure Laterality Date  . ABDOMINAL HYSTERECTOMY    . CHOLECYSTECTOMY    . PANCREATICODUODENECTOMY      [facility].  Neoadj chemoXRT, [place] 2007-2008     OB History   None      Home Medications    Prior to Admission medications   Medication Sig Start Date End Date Taking? Authorizing Provider  acetaminophen (TYLENOL) 500 MG tablet Take 1 tablet (500 mg total) by mouth every 6 (six) hours as needed for moderate pain or headache. Patient taking differently: Take 1,000 mg by mouth every 6 (six) hours as needed for moderate pain or headache.  08/14/16  Yes Regalado, Belkys A, MD  cholecalciferol (VITAMIN D) 1000 units tablet Take 2,000 Units by mouth 3 (three) times a week.    Yes [provider]  dextromethorphan-guaiFENesin (MUCINEX DM) 30-600 MG 12hr tablet Take 1 tablet by mouth 2 (two) times daily. Patient taking differently: Take 1 tablet by mouth daily.  05/30/17  Yes Debbe Odea, MD  feeding supplement (BOOST / RESOURCE BREEZE) LIQD Take 1 Container by mouth 3 (three) times daily between meals. Patient taking differently: Take 237 mLs by mouth every other day.  08/14/16  Yes Regalado, Belkys A, MD  ferrous sulfate 325 (65 FE) MG tablet Take 325 mg by mouth every other day.    Yes [provider]  levofloxacin (LEVAQUIN) 750 MG tablet Take 750 mg by mouth daily.   Yes [provider]  LORazepam (ATIVAN) 1 MG tablet Take 0.5-1 mg by mouth 2 (two) times daily as needed for anxiety.    Yes [provider]  pantoprazole (PROTONIX) 40 MG tablet Take 1 tablet (40 mg total) by  mouth 2 (two) times daily before a meal. Patient taking differently: Take 40 mg by mouth daily.  08/14/16  Yes Regalado, Belkys A, MD  Probiotic Product (PROBIOTIC PO) Take 1 capsule by mouth daily.    Yes [provider]  albuterol (PROVENTIL HFA;VENTOLIN HFA) 108 (90 Base) MCG/ACT inhaler Inhale 1-2 puffs into the lungs every 6 (six) hours as needed for wheezing or shortness of breath. 10/16/17 11/15/17  Damita Lack, MD    Family History Family History  Problem Relation  Age of Onset  . Lung cancer Brother   . Breast cancer Sister     Social History Social History   Tobacco Use  . Smoking status: Current Every Day Smoker    Packs/day: 1.00    Types: Cigarettes  . Smokeless tobacco: Never Used  Substance Use Topics  . Alcohol use: No  . Drug use: No     Allergies   Adhesive [tape]; Aspirin; Codeine-promethazine [promethazine-codeine]; Estrogens; and Phenobarbital   Review of Systems Review of Systems  Constitutional: Positive for activity change and diaphoresis.  Respiratory: Positive for cough.   Cardiovascular: Negative for chest pain.  Gastrointestinal: Negative for nausea and vomiting.  Genitourinary: Negative for dysuria.  Allergic/Immunologic: Negative for immunocompromised state.  Hematological: Does not bruise/bleed easily.  All other systems reviewed and are negative.    Physical Exam Updated Vital Signs BP (!) 104/55   Pulse 81   Temp 99 F (37.2 C) (Oral)   Resp (!) 22   Ht 5\' 8"  (1.727 m)   Wt 44.5 kg   SpO2 94%   BMI 14.90 kg/m   Physical Exam  Constitutional: She is oriented to person, place, and time. She appears well-developed.  HENT:  Head: Normocephalic and atraumatic.  Eyes: EOM are normal.  Neck: Normal range of motion. Neck supple.  Cardiovascular: Normal rate.  Pulmonary/Chest: Effort normal. She has wheezes.  Abdominal: Soft. Bowel sounds are normal. There is no tenderness.  Musculoskeletal: She exhibits no edema.  Neurological: She is alert and oriented to person, place, and time.  Skin: Skin is warm and dry.  Nursing note and vitals reviewed.    ED Treatments / Results  Labs (all labs ordered are listed, but only abnormal results are displayed) Labs Reviewed  CBC WITH DIFFERENTIAL/PLATELET - Abnormal; Notable for the following components:      Result Value   WBC 12.7 (*)    RBC 3.77 (*)    Hemoglobin 11.2 (*)    HCT 34.6 (*)    Neutro Abs 10.4 (*)    Abs Immature Granulocytes 0.11 (*)     All other components within normal limits  BASIC METABOLIC PANEL - Abnormal; Notable for the following components:   Sodium 134 (*)    Glucose, Bld 109 (*)    GFR calc non Af Amer 55 (*)    All other components within normal limits  URINALYSIS, ROUTINE W REFLEX MICROSCOPIC - Abnormal; Notable for the following components:   Hgb urine dipstick SMALL (*)    Ketones, ur 5 (*)    Bacteria, UA FEW (*)    All other components within normal limits  CULTURE, BLOOD (ROUTINE X 2)  CULTURE, BLOOD (ROUTINE X 2)  RESPIRATORY PANEL BY PCR  EXPECTORATED SPUTUM ASSESSMENT W REFEX TO RESP CULTURE  GRAM STAIN  TROPONIN I  HIV ANTIBODY (ROUTINE TESTING W REFLEX)  STREP PNEUMONIAE URINARY ANTIGEN  LEGIONELLA PNEUMOPHILA SEROGP 1 UR AG  I-STAT CG4 LACTIC ACID, ED  EKG EKG Interpretation  Date/Time:  Friday March 29 2018 11:54:33 EDT Ventricular Rate:  75 PR Interval:    QRS Duration: 84 QT Interval:  413 QTC Calculation: 462 R Axis:   29 Text Interpretation:  Sinus rhythm Borderline low voltage, extremity leads Abnormal R-wave progression, early transition No acute changes Confirmed by Varney Biles ([id number]) on 03/30/2018 8:35:27 AM   Radiology Dg Chest 2 View  Result Date: 03/29/2018 CLINICAL DATA:  Recent diagnosis of pneumonia.  Cough, flank pain. EXAM: CHEST - 2 VIEW COMPARISON:  Chest x-rays dated 03/27/2018 and 07/28/2016. FINDINGS: Patchy opacities bilaterally. Coarse lung markings again noted bilaterally indicating chronic interstitial lung disease and/or chronic bronchitic changes. No pleural effusion or pneumothorax seen. Lungs are hyperexpanded indicating COPD. No acute or suspicious osseous finding. Old compression fracture deformity at the thoracolumbar junction status post vertebroplasty, with associated stable kyphosis. Hiatal hernia again noted, moderate to large in size. IMPRESSION: 1. New patchy opacities bilaterally, suspicious for multifocal pneumonia superimposed on  chronic interstitial lung disease/fibrosis. 2. COPD with chronic interstitial lung disease and/or chronic bronchitic changes. 3. Hiatal hernia, moderate to large. Electronically Signed   By: Franki Cabot M.D.   On: 03/29/2018 11:10    Procedures Procedures (including critical care time)  Medications Ordered in ED Medications  levofloxacin (LEVAQUIN) tablet 750 mg (has no administration in time range)  LORazepam (ATIVAN) tablet 0.5-1 mg (1 mg Oral Given 03/30/18 0215)  pantoprazole (PROTONIX) EC tablet 40 mg (has no administration in time range)  ferrous sulfate tablet 325 mg (has no administration in time range)  cholecalciferol (VITAMIN D) tablet 2,000 Units (2,000 Units Oral Given 03/29/18 1825)  albuterol (PROVENTIL) (2.5 MG/3ML) 0.083% nebulizer solution 2.5 mg (has no administration in time range)  dextromethorphan-guaiFENesin (MUCINEX DM) 30-600 MG per 12 hr tablet 1 tablet (has no administration in time range)  enoxaparin (LOVENOX) injection 30 mg (30 mg Subcutaneous Given 03/29/18 2125)  feeding supplement (BOOST / RESOURCE BREEZE) liquid 1 Container (1 Container Oral Given 03/29/18 1956)  ondansetron (ZOFRAN) injection 4 mg (4 mg Intravenous Given 03/30/18 0207)  sodium chloride 0.9 % bolus 500 mL (0 mLs Intravenous Stopped 03/29/18 1258)  ceFEPIme (MAXIPIME) 2 g in sodium chloride 0.9 % 100 mL IVPB (0 g Intravenous Stopped 03/29/18 1250)  ipratropium-albuterol (DUONEB) 0.5-2.5 (3) MG/3ML nebulizer solution 3 mL (3 mLs Nebulization Given 03/29/18 1202)     Initial Impression / Assessment and Plan / ED Course  I have reviewed the triage vital signs and the nursing notes.  Pertinent labs & imaging results that were available during my care of the patient were reviewed by me and considered in my medical decision making (see chart for details).  Clinical Course as of Mar 30 834  [facility] Mar 29, 2018  1132 X-ray reviewed.  Patient appears to be having multifocal pneumonia. She has 0 sirs  criteria on her vital signs, and clinically does not appear to be septic.  DG Chest 2 View [AN]    Clinical Course User Index [AN] Varney Biles, MD    84 year old female with history of COPD and previous history of multiple pneumonias comes in with chief complaint of weakness and dry cough.  Patient has left-sided flank pain as well.  On exam she has mild wheezing diffusely over the lower lung fields.  She has no URI-like symptoms.  Clinical concerns are high for pneumonia, influenza or other viral infection causing COPD exacerbation, pyelonephritis, ACS.   Final Clinical Impressions(s) / ED Diagnoses  Final diagnoses:  Multifocal pneumonia    ED Discharge Orders    None       Varney Biles, MD 03/30/18 [phone]

## 2018-03-29 NOTE — Progress Notes (Signed)
Patient arrived on unit via stretcher from ED.  No family at bedside.  

## 2018-03-30 ENCOUNTER — Observation Stay (HOSPITAL_COMMUNITY): Payer: Medicare Other

## 2018-03-30 DIAGNOSIS — K219 Gastro-esophageal reflux disease without esophagitis: Secondary | ICD-10-CM

## 2018-03-30 DIAGNOSIS — F419 Anxiety disorder, unspecified: Secondary | ICD-10-CM

## 2018-03-30 DIAGNOSIS — J181 Lobar pneumonia, unspecified organism: Secondary | ICD-10-CM | POA: Diagnosis not present

## 2018-03-30 DIAGNOSIS — F329 Major depressive disorder, single episode, unspecified: Secondary | ICD-10-CM

## 2018-03-30 DIAGNOSIS — E43 Unspecified severe protein-calorie malnutrition: Secondary | ICD-10-CM | POA: Diagnosis not present

## 2018-03-30 DIAGNOSIS — J449 Chronic obstructive pulmonary disease, unspecified: Secondary | ICD-10-CM

## 2018-03-30 DIAGNOSIS — J189 Pneumonia, unspecified organism: Secondary | ICD-10-CM | POA: Diagnosis not present

## 2018-03-30 LAB — LEGIONELLA PNEUMOPHILA SEROGP 1 UR AG: L. PNEUMOPHILA SEROGP 1 UR AG: NEGATIVE

## 2018-03-30 LAB — HIV ANTIBODY (ROUTINE TESTING W REFLEX): HIV SCREEN 4TH GENERATION: NONREACTIVE

## 2018-03-30 LAB — MRSA PCR SCREENING: MRSA BY PCR: NEGATIVE

## 2018-03-30 MED ORDER — IPRATROPIUM-ALBUTEROL 0.5-2.5 (3) MG/3ML IN SOLN
3.0000 mL | Freq: Three times a day (TID) | RESPIRATORY_TRACT | Status: DC
  Administered 2018-03-31: 3 mL via RESPIRATORY_TRACT
  Filled 2018-03-30 (×2): qty 3

## 2018-03-30 MED ORDER — ONDANSETRON HCL 4 MG/2ML IJ SOLN
4.0000 mg | Freq: Four times a day (QID) | INTRAMUSCULAR | Status: DC | PRN
  Administered 2018-03-30: 4 mg via INTRAVENOUS
  Filled 2018-03-30: qty 2

## 2018-03-30 MED ORDER — SODIUM CHLORIDE 0.9 % IV SOLN
1.0000 g | Freq: Two times a day (BID) | INTRAVENOUS | Status: DC
  Administered 2018-03-30 – 2018-03-31 (×3): 1 g via INTRAVENOUS
  Filled 2018-03-30 (×4): qty 1

## 2018-03-30 MED ORDER — IPRATROPIUM-ALBUTEROL 0.5-2.5 (3) MG/3ML IN SOLN
3.0000 mL | Freq: Four times a day (QID) | RESPIRATORY_TRACT | Status: DC
  Administered 2018-03-30 (×2): 3 mL via RESPIRATORY_TRACT
  Filled 2018-03-30 (×2): qty 3

## 2018-03-30 MED ORDER — TRAZODONE HCL 50 MG PO TABS
50.0000 mg | ORAL_TABLET | Freq: Every day | ORAL | Status: DC
  Administered 2018-03-30: 50 mg via ORAL
  Filled 2018-03-30: qty 1

## 2018-03-30 MED ORDER — DM-GUAIFENESIN ER 30-600 MG PO TB12
1.0000 | ORAL_TABLET | Freq: Two times a day (BID) | ORAL | Status: DC
  Administered 2018-03-30 – 2018-03-31 (×3): 1 via ORAL
  Filled 2018-03-30 (×3): qty 1

## 2018-03-30 NOTE — Progress Notes (Signed)
PROGRESS NOTE    Mackenzie Mendoza   YFV:[id number]  DOB: 1933/06/08  DOA: 03/29/2018 PCP: Lavone Orn, MD   HPI:  Mackenzie Mendoza is a 84 y.o. female with medical history significant of COPD, depression, GERD, anxiety.  Patient presented secondary to feeling weak over the last couple of days which has been worsening.  She was diagnosed with pneumonia as an outpatient 2 days ago.  She started on Levaquin and has been taking it for the past 2 days.  She did not take a dose this morning.  She reports a productive cough.   Subjective: Ongoing cough with congestion.   Poor appetite. Could not sleep at all last night.     Assessment & Plan:   Principal Problem:   Multifocal pneumonia - failed Levaquin- add Cefepime as I feel she is aspiration- she has had multiple pneumonias this past year - add Mucinex DM - Duonebs Q6 - obtain SLP eval and MBS - have began discussions about recurrent episodes, code status and palliative care - will need outpt palliative care consult  Active Problems:  Mild hyponatremia - s/p NS bolus     GERD (gastroesophageal reflux disease) - cont Protonix    Protein-calorie malnutrition, severe - underweight - Body mass index is 14.9 kg/m.  - cont boost    COPD (chronic obstructive pulmonary disease) - no exacerbation- cont Nebs    Anxiety - cont Ativan  Insomnia - add Trazodone   DVT prophylaxis: Lovenox Code Status: Full code Family Communication: none Disposition Plan: home possibly tomorrow after SLP assessment Consultants:   none Procedures:   none Antimicrobials:  Anti-infectives (From admission, onward)   Start     Dose/Rate Route Frequency Ordered Stop   03/30/18 2200  levofloxacin (LEVAQUIN) tablet 750 mg     750 mg Oral Every 48 hours 03/29/18 1419     03/29/18 1130  ceFEPIme (MAXIPIME) 2 g in sodium chloride 0.9 % 100 mL IVPB     2 g 200 mL/hr over 30 Minutes Intravenous  Once 03/29/18 1127 03/29/18 1250   03/29/18  1130  azithromycin (ZITHROMAX) 500 mg in sodium chloride 0.9 % 250 mL IVPB  Status:  Discontinued     500 mg 250 mL/hr over 60 Minutes Intravenous Every 24 hours 03/29/18 1127 03/29/18 1419       Objective: Vitals:   03/29/18 1644 03/29/18 1645 03/29/18 1955 03/30/18 0445  BP:   107/61 (!) 104/55  Pulse:   84 81  Resp:   (!) 21 (!) 22  Temp:   99.7 F (37.6 C) 99 F (37.2 C)  TempSrc:   Oral Oral  SpO2: 99% (!) 85% 97% 94%  Weight:      Height:        Intake/Output Summary (Last 24 hours) at 03/30/2018 1034 Last data filed at 03/30/2018 1021 Gross per 24 hour  Intake 1210 ml  Output 400 ml  Net 810 ml   Filed Weights   03/29/18 0937  Weight: 44.5 kg    Examination: General exam: Appears comfortable - severely malnourished, cachectic  HEENT: PERRLA, oral mucosa moist, no sclera icterus or thrush Respiratory system: bilateral coarse crackles- cough- rhonchi Respiratory effort normal. Cardiovascular system: S1 & S2 heard, RRR.   Gastrointestinal system: Abdomen soft, non-tender, nondistended. Normal bowel sounds.Scaphoid  Central nervous system: Alert and oriented. No focal neurological deficits. Extremities: No cyanosis, clubbing or edema Skin: No rashes or ulcers Psychiatry:  Mood & affect appropriate.  Data Reviewed: I have personally reviewed following labs and imaging studies  CBC: Recent Labs  Lab 03/29/18 1046  WBC 12.7*  NEUTROABS 10.4*  HGB 11.2*  HCT 34.6*  MCV 91.8  PLT 462   Basic Metabolic Panel: Recent Labs  Lab 03/29/18 1046  NA 134*  K 3.9  CL 99  CO2 29  GLUCOSE 109*  BUN 11  CREATININE 0.94  CALCIUM 9.1   GFR: Estimated Creatinine Clearance: 32.4 mL/min (by C-G formula based on SCr of 0.94 mg/dL). Liver Function Tests: No results for input(s): AST, ALT, ALKPHOS, BILITOT, PROT, ALBUMIN in the last 168 hours. No results for input(s): LIPASE, AMYLASE in the last 168 hours. No results for input(s): AMMONIA in the last 168  hours. Coagulation Profile: No results for input(s): INR, PROTIME in the last 168 hours. Cardiac Enzymes: Recent Labs  Lab 03/29/18 1046  TROPONINI <0.03   BNP (last 3 results) No results for input(s): PROBNP in the last 8760 hours. HbA1C: No results for input(s): HGBA1C in the last 72 hours. CBG: No results for input(s): GLUCAP in the last 168 hours. Lipid Profile: No results for input(s): CHOL, HDL, LDLCALC, TRIG, CHOLHDL, LDLDIRECT in the last 72 hours. Thyroid Function Tests: No results for input(s): TSH, T4TOTAL, FREET4, T3FREE, THYROIDAB in the last 72 hours. Anemia Panel: No results for input(s): VITAMINB12, FOLATE, FERRITIN, TIBC, IRON, RETICCTPCT in the last 72 hours. Urine analysis:    Component Value Date/Time   COLORURINE YELLOW 03/29/2018 [place] 03/29/2018 1047   LABSPEC 1.005 03/29/2018 1047   PHURINE 7.0 03/29/2018 1047   GLUCOSEU NEGATIVE 03/29/2018 1047   HGBUR SMALL (A) 03/29/2018 1047   BILIRUBINUR NEGATIVE 03/29/2018 1047   KETONESUR 5 (A) 03/29/2018 1047   PROTEINUR NEGATIVE 03/29/2018 1047   UROBILINOGEN 1.0 08/11/2014 2239   NITRITE NEGATIVE 03/29/2018 1047   LEUKOCYTESUR NEGATIVE 03/29/2018 1047   Sepsis Labs: @LABRCNTIP (procalcitonin:4,lacticidven:4) ) Recent Results (from the past 240 hour(s))  Culture, blood (Routine X 2) w Reflex to ID Panel     Status: None (Preliminary result)   Collection Time: 03/29/18 11:45 AM  Result Value Ref Range Status   Specimen Description   Final    BLOOD RIGHT ANTECUBITAL Performed at [facility], [place] [address] [place], [place] [address]    Special Requests   Final    BOTTLES DRAWN AEROBIC AND ANAEROBIC Blood Culture adequate volume Performed at [facility] [address] [place], [place] [address]    Culture   Final    NO GROWTH < 24 HOURS Performed at [facility] [address] [place], [place] [address]    Report Status PENDING   Incomplete  Culture, blood (Routine X 2) w Reflex to ID Panel     Status: None (Preliminary result)   Collection Time: 03/29/18 11:46 AM  Result Value Ref Range Status   Specimen Description   Final    BLOOD LEFT ANTECUBITAL Performed at [facility] [address] [place], [place] [address]    Special Requests   Final    Blood Culture adequate volume Performed at [facility] [address], [place], [place] [address]    Culture   Final    NO GROWTH < 24 HOURS Performed at [facility] [address] [place], [place] [address]    Report Status PENDING  Incomplete  Respiratory Panel by PCR     Status: None  Collection Time: 03/29/18  4:30 PM  Result Value Ref Range Status   Adenovirus NOT DETECTED NOT DETECTED Final   Coronavirus 229E NOT DETECTED NOT DETECTED Final   Coronavirus HKU1 NOT DETECTED NOT DETECTED Final   Coronavirus NL63 NOT DETECTED NOT DETECTED Final   Coronavirus OC43 NOT DETECTED NOT DETECTED Final   Metapneumovirus NOT DETECTED NOT DETECTED Final   Rhinovirus / Enterovirus NOT DETECTED NOT DETECTED Final   Influenza A NOT DETECTED NOT DETECTED Final   Influenza B NOT DETECTED NOT DETECTED Final   Parainfluenza Virus 1 NOT DETECTED NOT DETECTED Final   Parainfluenza Virus 2 NOT DETECTED NOT DETECTED Final   Parainfluenza Virus 3 NOT DETECTED NOT DETECTED Final   Parainfluenza Virus 4 NOT DETECTED NOT DETECTED Final   Respiratory Syncytial Virus NOT DETECTED NOT DETECTED Final   Bordetella pertussis NOT DETECTED NOT DETECTED Final   Chlamydophila pneumoniae NOT DETECTED NOT DETECTED Final   Mycoplasma pneumoniae NOT DETECTED NOT DETECTED Final    Comment: Performed at [facility], [place] [address] [place],  [address]         Radiology Studies: Dg Chest 2 View  Result Date: 03/29/2018 CLINICAL DATA:  Recent diagnosis of pneumonia.  Cough, flank pain. EXAM: CHEST - 2 VIEW COMPARISON:  Chest  x-rays dated 03/27/2018 and 07/28/2016. FINDINGS: Patchy opacities bilaterally. Coarse lung markings again noted bilaterally indicating chronic interstitial lung disease and/or chronic bronchitic changes. No pleural effusion or pneumothorax seen. Lungs are hyperexpanded indicating COPD. No acute or suspicious osseous finding. Old compression fracture deformity at the thoracolumbar junction status post vertebroplasty, with associated stable kyphosis. Hiatal hernia again noted, moderate to large in size. IMPRESSION: 1. New patchy opacities bilaterally, suspicious for multifocal pneumonia superimposed on chronic interstitial lung disease/fibrosis. 2. COPD with chronic interstitial lung disease and/or chronic bronchitic changes. 3. Hiatal hernia, moderate to large. Electronically Signed   By: Franki Cabot M.D.   On: 03/29/2018 11:10      Scheduled Meds: . cholecalciferol  2,000 Units Oral Once per day on Mon Wed Fri  . dextromethorphan-guaiFENesin  1 tablet Oral BID  . enoxaparin (LOVENOX) injection  30 mg Subcutaneous Q24H  . feeding supplement  1 Container Oral TID BM  . ferrous sulfate  325 mg Oral QODAY  . levofloxacin  750 mg Oral Q48H  . pantoprazole  40 mg Oral Daily  . traZODone  50 mg Oral QHS   Continuous Infusions:   LOS: 0 days    Time spent in minutes: 35    Debbe Odea, MD [facility] Pager: [email or web address] Password [facility] 03/30/2018, 10:34 AM

## 2018-03-30 NOTE — Evaluation (Signed)
SLP Cancellation Note  Patient Details Name: Mackenzie Mendoza MRN: [id number] DOB: Aug 01, 1933   Cancelled treatment:       Reason Eval/Treat Not Completed: Other (comment)(pt informed earlier of mbs to be completed today by this SlP, she advised she would participate at that point.   However when xray staff arrived to transport pt, she declined to come despite being informed to reasoning for mbs)    Will follow up next date to determine it pt will participate in exam.  RN Wells Guiles informed to pt refusal and was encouraged to educate pt to reasoning for exam and that SlP will follow up next date.    Luanna Salk, MS [facility] SLP Acute Rehab Services Pager [phone] Office [phone]    Macario Golds 03/30/2018, 3:38 PM

## 2018-03-30 NOTE — Progress Notes (Signed)
Pharmacy Antibiotic Note  Mackenzie Mendoza is a 84 y.o. female admitted on 03/29/2018 with multifocal pneumonia.  Pharmacy has been consulted for Cefepime dosing.  Plan: Cefepime 1g IV q12h. Monitor renal function, cultures, clinical course.   Height: 5\' 8"  (172.7 cm) Weight: 98 lb (44.5 kg) IBW/kg (Calculated) : 63.9  Temp (24hrs), Avg:99.3 F (37.4 C), Min:99 F (37.2 C), Max:99.7 F (37.6 C)  Recent Labs  Lab 03/29/18 1046 03/29/18 1155  WBC 12.7*  --   CREATININE 0.94  --   LATICACIDVEN  --  0.98    Estimated Creatinine Clearance: 32.4 mL/min (by C-G formula based on SCr of 0.94 mg/dL).    Allergies  Allergen Reactions  . Adhesive [Tape] Other (See Comments)    TAPE TEARS THE SKIN VERY EASILY (it's thin!!)  . Aspirin Hives  . Codeine-Promethazine [Promethazine-Codeine] Nausea And Vomiting  . Estrogens Hives  . Phenobarbital Itching    Antimicrobials this admission: PTA Levofloxacin  11/1 Cefepime x 1, resumed 11/2 >> 11/1 Azithromycin x 1  Dose adjustments this admission: --  Microbiology results: 11/1 BCx: NGTD Sputum: ordered  11/1 Strep pneumo ur ag: negative 11/1 Legionella ur ag: IP  11/1 RVP: negative  11/2 MRSA PCR: negative   Thank you for allowing pharmacy to be a part of this patient's care.   Lindell Spar, PharmD, BCPS Pager: [phone] 03/30/2018 11:07 AM

## 2018-03-30 NOTE — Evaluation (Signed)
Physical Therapy Evaluation Patient Details Name: Mackenzie Mendoza MRN: [id number] DOB: 09-Sep-1933 Today's Date: 03/30/2018   History of Present Illness  Mackenzie Mendoza is a 84 y.o.femalewith medical history significant ofCOPD, depression, GERD, anxiety.Patient presented secondary to feeling weak over the last couple of days which has been worsening. She was diagnosed with pneumonia as an outpatient 2 days ago. She started on Levaquin and has been taking it for the past 2 days. She did not take a dose this morning. She reports a productive cough.  Clinical Impression  Pt presents with decreased balance and decreased activity tolerance due to the above diagnosis. Pt reports her husband and daughter are unable to provide physical assistance if needed. Pt is min assist without an AD. Pt ambulated with a RW min guard assist. Recommend d/c home when medically stable with a RW for mobility and follow-up HHPT for improved mobility. Pt will continue to benefit from acute PT until d/c home.    Follow Up Recommendations Home health PT    Equipment Recommendations  None recommended by PT(Pt has RW)    Recommendations for Other Services       Precautions / Restrictions Precautions Precautions: Fall Restrictions Weight Bearing Restrictions: No      Mobility  Bed Mobility Overal bed mobility: Independent                Transfers Overall transfer level: Needs assistance Equipment used: Rolling walker (2 wheeled) Transfers: Sit to/from [facility] Sit to Stand: Min guard Stand pivot transfers: Min guard          Ambulation/Gait Ambulation/Gait assistance: Min guard Gait Distance (Feet): 150 Feet Assistive device: Rolling walker (2 wheeled) Gait Pattern/deviations: Step-through pattern;Trunk flexed Gait velocity: decreased   General Gait Details: pt min assist with no AD. Pt reports her husband and daughter are unable to provide physical assist. I  recommended ambulation with RW for improved safety.  Stairs            Wheelchair Mobility    Modified Rankin (Stroke Patients Only)       Balance Overall balance assessment: Needs assistance Sitting-balance support: Bilateral upper extremity supported Sitting balance-Leahy Scale: Good     Standing balance support: Bilateral upper extremity supported Standing balance-Leahy Scale: Fair                               Pertinent Vitals/Pain Pain Assessment: No/denies pain    Home Living Family/patient expects to be discharged to:: Private residence Living Arrangements: Spouse/significant other;Children Available Help at Discharge: Family Type of Home: House Home Access: Stairs to enter Entrance Stairs-Rails: (1 rail- pt unsure which side) Entrance Stairs-Number of Steps: 2 Home Layout: One level Home Equipment: Walker - 2 wheels      Prior Function Level of Independence: Independent               Hand Dominance        Extremity/Trunk Assessment   Upper Extremity Assessment Upper Extremity Assessment: Defer to OT evaluation    Lower Extremity Assessment Lower Extremity Assessment: Overall WFL for tasks assessed    Cervical / Trunk Assessment Cervical / Trunk Assessment: Kyphotic  Communication   Communication: No difficulties  Cognition Arousal/Alertness: Awake/alert Behavior During Therapy: WFL for tasks assessed/performed Overall Cognitive Status: Within Functional Limits for tasks assessed  General Comments      Exercises     Assessment/Plan    PT Assessment Patient needs continued PT services  PT Problem List Decreased balance;Decreased mobility;Decreased knowledge of use of DME;Decreased activity tolerance;Decreased safety awareness       PT Treatment Interventions DME instruction;Functional mobility training;Patient/family education;Balance training;Gait  training;Therapeutic activities;Therapeutic exercise    PT Goals (Current goals can be found in the Care Plan section)  Acute Rehab PT Goals Patient Stated Goal: To return home PT Goal Formulation: With patient Time For Goal Achievement: 04/13/18 Potential to Achieve Goals: Good    Frequency Min 3X/week   Barriers to discharge        Co-evaluation               AM-PAC PT "6 Clicks" Daily Activity  Outcome Measure Difficulty turning over in bed (including adjusting bedclothes, sheets and blankets)?: None Difficulty moving from lying on back to sitting on the side of the bed? : None Difficulty sitting down on and standing up from a chair with arms (e.g., wheelchair, bedside commode, etc,.)?: A Little Help needed moving to and from a bed to chair (including a wheelchair)?: A Little Help needed walking in hospital room?: A Little Help needed climbing 3-5 steps with a railing? : A Little 6 Click Score: 20    End of Session Equipment Utilized During Treatment: Gait belt Activity Tolerance: Patient tolerated treatment well Patient left: in bed;with bed alarm set;with nursing/sitter in room Nurse Communication: Mobility status PT Visit Diagnosis: Unsteadiness on feet (R26.81)    Time: 0867-6195 PT Time Calculation (min) (ACUTE ONLY): 25 min   Charges:   PT Evaluation $PT Eval Moderate Complexity: 1 Mod PT Treatments $Gait Training: 8-22 mins        Theodoro Grist, PT  Lelon Mast 03/30/2018, 2:19 PM

## 2018-03-30 NOTE — Progress Notes (Signed)
O2 94% while ambulating on room air.

## 2018-03-31 ENCOUNTER — Observation Stay (HOSPITAL_COMMUNITY): Payer: Medicare Other

## 2018-03-31 DIAGNOSIS — F329 Major depressive disorder, single episode, unspecified: Secondary | ICD-10-CM | POA: Diagnosis not present

## 2018-03-31 DIAGNOSIS — J449 Chronic obstructive pulmonary disease, unspecified: Secondary | ICD-10-CM | POA: Diagnosis not present

## 2018-03-31 DIAGNOSIS — J181 Lobar pneumonia, unspecified organism: Secondary | ICD-10-CM | POA: Diagnosis not present

## 2018-03-31 DIAGNOSIS — E43 Unspecified severe protein-calorie malnutrition: Secondary | ICD-10-CM | POA: Diagnosis not present

## 2018-03-31 DIAGNOSIS — K219 Gastro-esophageal reflux disease without esophagitis: Secondary | ICD-10-CM | POA: Diagnosis not present

## 2018-03-31 DIAGNOSIS — F419 Anxiety disorder, unspecified: Secondary | ICD-10-CM | POA: Diagnosis not present

## 2018-03-31 DIAGNOSIS — J189 Pneumonia, unspecified organism: Secondary | ICD-10-CM | POA: Diagnosis not present

## 2018-03-31 LAB — CBC
HEMATOCRIT: 29.3 % — AB (ref 36.0–46.0)
Hemoglobin: 9.5 g/dL — ABNORMAL LOW (ref 12.0–15.0)
MCH: 30.1 pg (ref 26.0–34.0)
MCHC: 32.4 g/dL (ref 30.0–36.0)
MCV: 92.7 fL (ref 80.0–100.0)
Platelets: 296 10*3/uL (ref 150–400)
RBC: 3.16 MIL/uL — ABNORMAL LOW (ref 3.87–5.11)
RDW: 12.9 % (ref 11.5–15.5)
WBC: 7.8 10*3/uL (ref 4.0–10.5)
nRBC: 0 % (ref 0.0–0.2)

## 2018-03-31 LAB — BASIC METABOLIC PANEL
ANION GAP: 7 (ref 5–15)
BUN: 11 mg/dL (ref 8–23)
CO2: 29 mmol/L (ref 22–32)
Calcium: 8.8 mg/dL — ABNORMAL LOW (ref 8.9–10.3)
Chloride: 100 mmol/L (ref 98–111)
Creatinine, Ser: 0.85 mg/dL (ref 0.44–1.00)
GFR calc Af Amer: >60 mL/min (ref >60)
GLUCOSE: 134 mg/dL — AB (ref 70–99)
POTASSIUM: 3.7 mmol/L (ref 3.5–5.1)
Sodium: 136 mmol/L (ref 135–145)

## 2018-03-31 MED ORDER — ALBUTEROL SULFATE HFA 108 (90 BASE) MCG/ACT IN AERS: 1.0000 | INHALATION_SPRAY | Freq: Four times a day (QID) | RESPIRATORY_TRACT | 2 refills | Status: AC | PRN

## 2018-03-31 MED ORDER — SODIUM CHLORIDE 0.9 % IV SOLN
INTRAVENOUS | Status: DC | PRN
  Administered 2018-03-31: 250 mL via INTRAVENOUS

## 2018-03-31 NOTE — Progress Notes (Signed)
Discharge instructions and medications discussed with patient.  AVS given to patient.  All questions answered.  

## 2018-03-31 NOTE — Progress Notes (Signed)
Modified Barium Swallow Progress Note  Patient Details  Name: Mackenzie Mendoza MRN: [id number] Date of Birth: [birth year]  Today's Date: 03/31/2018  Modified Barium Swallow completed.  Full report located under Chart Review in the Imaging Section.  Brief recommendations include the following:  Clinical Impression  NO aspiration and only trace episodic laryngeal penetration of thin that cleared with further swallows noted.  This laryngeal penetration is WFL given pt's advanced age.  Minimal pharyngeal - more pyriform - residuals noted after swallow which cleared with CUED dry swallow.    Pt did have difficulty coordinating swallowing with tablet - first with liquids when she proceeded to cough and expectorated per SLP cue.  2nd attempt with pudding with which pt was coughing and did not orally transit again expectorating.  3rd attempt with new tablet - pt placed tablet in posterior oral cavity and cleared it easily with liquid.  Educated her to alternative ways to take medications if problematic for her.   Of note, upon esophageal sweep x3, middle of testing noted pt appeared with mild residuals that she did not sense.  Liquid swallows helpful to decrease residuals.  After swallowing barium tablet - upon esophageal sweep, esophagus appeared clear.  Recommend continue diet as tolerated, encouraging pt to drink liquids throughout meal.    Swallow Evaluation Recommendations       SLP Diet Recommendations: Regular solids;Thin liquid   Liquid Administration via: Cup;Straw   Medication Administration: (as tolerated)   Supervision: Patient able to self feed   Compensations: Slow rate;Small sips/bites(drink liquids t/o meal)   Postural Changes: Seated upright at 90 degrees;Remain semi-upright after after feeds/meals (Comment)   Oral Care Recommendations: Oral care BID      Luanna Salk, MS [facility] SLP Acute Rehab Services Pager [phone] Office [phone]   Macario Golds 03/31/2018,11:52 AM

## 2018-03-31 NOTE — Progress Notes (Signed)
MBS completed, full report to follow.  NO aspiration and only trace episodic laryngeal penetration of thin that cleared with further swallows noted.  This laryngeal penetration is WFL given pt's advanced age.  Minimal pharyngeal - more pyriform - residuals noted after swallow which cleared with CUED dry swallow.    Pt did have difficulty swallowing tablet - first with liquids when she proceeded to cough and expectorated per SLP cue.  2nd attempt with pudding with which pt was coughing and did not orally transit again expectorating.  3rd attempt with new tablet - pt placed tablet in posterior oral cavity and cleared it easily with liquid.  Educated her to alternative ways to take medications if problematic for her.   Of note, upon esophageal sweep x3, middle of testing noted pt appeared with mild residuals that she did not sense.  Liquid swallows helpful to decrease residuals.  After swallowing barium tablet - upon esophageal sweep, esophagus appeared clear.    Recommend continue diet as tolerated, encouraging pt to drink liquids throughout meal.    Luanna Salk, [facility] SLP [place] Pager [phone] Office [phone]

## 2018-03-31 NOTE — Discharge Summary (Signed)
Physician Discharge Summary  Mackenzie Mendoza IHK:[id number] DOB: 1934/02/28 DOA: 03/29/2018  PCP: Mackenzie Orn, MD  Admit date: 03/29/2018 Discharge date: 03/31/2018  Admitted From: home  Disposition:  home   Recommendations for Outpatient Follow-up:  1. Please repeat CXR once pneumonia resolves  Home Health:  ordered      Discharge Condition:  stable   CODE STATUS:  Full code   Consultations:  none    Discharge Diagnoses:  Principal Problem:   Multifocal pneumonia Active Problems:   Depression   GERD (gastroesophageal reflux disease)   Protein-calorie malnutrition, severe (HCC)   COPD (chronic obstructive pulmonary disease) ([place])   Anxiety   Brief Summary: Mackenzie Mendoza is a 84 y.o.femalewith medical history significant ofCOPD, depression, GERD, anxiety.Patient presented secondary to feeling weak over the last couple of days which has been worsening. She was diagnosed with pneumonia as an outpatient 2 days ago. She started on Levaquin and has been taking it for the past 2 days. She did not take a dose this morning. She reports a productive cough.  Hospital Course:  Principal Problem:   Multifocal pneumonia - failed Levofloxacin - added Cefepime as I feel she is aspiration- she has had multiple pneumonias this past year - add Mucinex DM - Duonebs Q6 - blood cx neg- resp panel neg - obtained SLP eval and MBS- no signs of aspiration but she did have "mild residuals that she did not sense"- recommended regular diet  - I feel she has had an adequate amount of IV antibiotics and can go back to oral Levofloxacin to finish off the course- will have a total of a 7 day course   Active Problems:  Mild hyponatremia - improved s/p NS bolus  Generalized weakness - due to pneumonia, dehydration- will have HHPT     GERD (gastroesophageal reflux disease) - cont Protonix    Protein-calorie malnutrition, severe - underweight - Body mass index is 14.9 kg/m.   - cont boost    COPD (chronic obstructive pulmonary disease) - no exacerbation- cont Nebs    Anxiety - cont Ativan  Insomnia - add Trazodone  Disposition - very frail - suspect she has a poor immune system. Recommended DNR but she did not decide on this   Discharge Exam: Vitals:   03/31/18 0905 03/31/18 1349  BP:  (!) 92/48  Pulse: 78 79  Resp: 17 (!) 22  Temp:  99.2 F (37.3 C)  SpO2: 98% 98%   Vitals:   03/30/18 2027 03/31/18 0529 03/31/18 0905 03/31/18 1349  BP: (!) 96/54 (!) 97/58  (!) 92/48  Pulse: 80 84 78 79  Resp: 16 17 17  (!) 22  Temp: 98.8 F (37.1 C) 98.7 F (37.1 C)  99.2 F (37.3 C)  TempSrc: Oral Oral  Oral  SpO2: 99% 94% 98% 98%  Weight:      Height:        General: Pt is alert, awake, not in acute distress Cardiovascular: RRR, S1/S2 +, no rubs, no gallops Respiratory: bilateral crackles, no wheezing, no rhonchi Abdominal: Soft, NT, ND, bowel sounds + Extremities: no edema, no cyanosis   Discharge Instructions  Discharge Instructions    Increase activity slowly   Complete by:  As directed      Allergies as of 03/31/2018      Reactions   Adhesive [tape] Other (See Comments)   TAPE TEARS THE SKIN VERY EASILY (it's thin!!)   Aspirin Hives   Codeine-promethazine [promethazine-codeine] Nausea And Vomiting  Estrogens Hives   Phenobarbital Itching      Medication List    TAKE these medications   acetaminophen 500 MG tablet Commonly known as:  TYLENOL Take 1 tablet (500 mg total) by mouth every 6 (six) hours as needed for moderate pain or headache. What changed:  how much to take   albuterol 108 (90 Base) MCG/ACT inhaler Commonly known as:  PROVENTIL HFA;VENTOLIN HFA Inhale 1-2 puffs into the lungs every 6 (six) hours as needed for wheezing or shortness of breath.   cholecalciferol 1000 units tablet Commonly known as:  VITAMIN D Take 2,000 Units by mouth 3 (three) times a week.   dextromethorphan-guaiFENesin 30-600 MG 12hr  tablet Commonly known as:  MUCINEX DM Take 1 tablet by mouth 2 (two) times daily. What changed:  when to take this   feeding supplement Liqd Take 1 Container by mouth 3 (three) times daily between meals. What changed:  when to take this   ferrous sulfate 325 (65 FE) MG tablet Take 325 mg by mouth every other day.   levofloxacin 750 MG tablet Commonly known as:  LEVAQUIN Take 750 mg by mouth daily.   LORazepam 1 MG tablet Commonly known as:  ATIVAN Take 0.5-1 mg by mouth 2 (two) times daily as needed for anxiety.   pantoprazole 40 MG tablet Commonly known as:  PROTONIX Take 1 tablet (40 mg total) by mouth 2 (two) times daily before a meal. What changed:  when to take this   PROBIOTIC PO Take 1 capsule by mouth daily.       Allergies  Allergen Reactions  . Adhesive [Tape] Other (See Comments)    TAPE TEARS THE SKIN VERY EASILY (it's thin!!)  . Aspirin Hives  . Codeine-Promethazine [Promethazine-Codeine] Nausea And Vomiting  . Estrogens Hives  . Phenobarbital Itching     Procedures/Studies:   Dg Chest 2 View  Result Date: 03/29/2018 CLINICAL DATA:  Recent diagnosis of pneumonia.  Cough, flank pain. EXAM: CHEST - 2 VIEW COMPARISON:  Chest x-rays dated 03/27/2018 and 07/28/2016. FINDINGS: Patchy opacities bilaterally. Coarse lung markings again noted bilaterally indicating chronic interstitial lung disease and/or chronic bronchitic changes. No pleural effusion or pneumothorax seen. Lungs are hyperexpanded indicating COPD. No acute or suspicious osseous finding. Old compression fracture deformity at the thoracolumbar junction status post vertebroplasty, with associated stable kyphosis. Hiatal hernia again noted, moderate to large in size. IMPRESSION: 1. New patchy opacities bilaterally, suspicious for multifocal pneumonia superimposed on chronic interstitial lung disease/fibrosis. 2. COPD with chronic interstitial lung disease and/or chronic bronchitic changes. 3. Hiatal  hernia, moderate to large. Electronically Signed   By: Franki Cabot M.D.   On: 03/29/2018 11:10   Dg Chest 2 View  Result Date: 03/27/2018 CLINICAL DATA:  Fever, cough EXAM: CHEST - 2 VIEW COMPARISON:  10/13/2017 FINDINGS: Severe emphysema. Moderate-sized hiatal hernia. Heart is normal size. Biapical scarring peribronchial thickening and patchy airspace opacities in the left perihilar and infrahilar regions concerning for pneumonia. Right lung clear. No effusions. No acute bony abnormality. Prior vertebroplasty changes at L1 and L3. IMPRESSION: Severe emphysema. Moderate hiatal hernia. Patchy left perihilar and infrahilar opacities concerning for pneumonia. Electronically Signed   By: Rolm Baptise M.D.   On: 03/27/2018 11:46   Dg Swallowing Func-speech Pathology  Result Date: 03/31/2018 Objective Swallowing Evaluation: Type of Study: MBS-Modified Barium Swallow Study  Patient Details Name: KAZIAH KRIZEK MRN: [id number] Date of Birth: 10/20/1933 Today's Date: 03/31/2018 Time: SLP Start Time (ACUTE ONLY): 1941 -SLP Stop  Time (ACUTE ONLY): 1124 SLP Time Calculation (min) (ACUTE ONLY): 19 min Past Medical History: Past Medical History: Diagnosis Date . Cancer [facility])  Past Surgical History: Past Surgical History: Procedure Laterality Date . ABDOMINAL HYSTERECTOMY   . CHOLECYSTECTOMY   . PANCREATICODUODENECTOMY    [facility].  Neoadj chemoXRT, [facility] 2007-2008 HPI: 84 yo adm to [facility] with multifocal pna,  Pt PMH + for COPD, GERD, anxiety, depression, bile duct cancer s/p [facility].  MBS ordered.  Pt has h/o esophagram completed 2008 with findings of large hiatal hernia, esophageal dysmotility *likely presbyesophagus* and 50% of stomach in chest causing mass effect on esophagus.   Pt denies dysphagia.    Subjective: pt awake in chair Assessment / Plan / Recommendation CHL IP CLINICAL IMPRESSIONS 03/31/2018 Clinical Impression NO aspiration and only trace episodic laryngeal penetration of thin that cleared with further  swallows noted.  This laryngeal penetration is WFL given pt's advanced age.  Minimal pharyngeal - more pyriform - residuals noted after swallow which cleared with CUED dry swallow.  Pt did have difficulty swallowing tablet - first with liquids when she proceeded to cough and expectorated per SLP cue.  2nd attempt with pudding with which pt was coughing and did not orally transit again expectorating.  3rd attempt with new tablet - pt placed tablet in posterior oral cavity and cleared it easily with liquid.  Educated her to alternative ways to take medications if problematic for her. Of note, upon esophageal sweep x3, middle of testing noted pt appeared with mild residuals that she did not sense.  Liquid swallows helpful to decrease residuals.  After swallowing barium tablet - upon esophageal sweep, esophagus appeared clear.  Recommend continue diet as tolerated, encouraging pt to drink liquids throughout meal.  SLP Visit Diagnosis Dysphagia, unspecified (R13.10);Dysphagia, oral phase (R13.11) Attention and concentration deficit following -- Frontal lobe and executive function deficit following -- Impact on safety and function Mild aspiration risk   CHL IP TREATMENT RECOMMENDATION 03/31/2018 Treatment Recommendations No treatment recommended at this time   No flowsheet data found. CHL IP DIET RECOMMENDATION 03/31/2018 SLP Diet Recommendations Regular solids;Thin liquid Liquid Administration via Cup;Straw Medication Administration (No Data) Compensations Slow rate;Small sips/bites Postural Changes Seated upright at 90 degrees;Remain semi-upright after after feeds/meals (Comment)   CHL IP OTHER RECOMMENDATIONS 03/31/2018 Recommended Consults -- Oral Care Recommendations Oral care BID Other Recommendations --   CHL IP FOLLOW UP RECOMMENDATIONS 03/31/2018 Follow up Recommendations None   CHL IP FREQUENCY AND DURATION 10/14/2017 Speech Therapy Frequency (ACUTE ONLY) min 2x/week Treatment Duration 1 week      CHL IP ORAL PHASE  03/31/2018 Oral Phase WFL Oral - Pudding Teaspoon -- Oral - Pudding Cup -- Oral - Honey Teaspoon -- Oral - Honey Cup -- Oral - Nectar Teaspoon -- Oral - Nectar Cup WFL Oral - Nectar Straw WFL Oral - Thin Teaspoon WFL Oral - Thin Cup WFL Oral - Thin Straw WFL Oral - Puree Delayed oral transit Oral - Mech Soft -- Oral - Regular WFL Oral - Multi-Consistency -- Oral - Pill Reduced posterior propulsion;Holding of bolus;Weak lingual manipulation;Other (Comment) Oral Phase - Comment --  CHL IP PHARYNGEAL PHASE 03/31/2018 Pharyngeal Phase WFL Pharyngeal- Pudding Teaspoon -- Pharyngeal -- Pharyngeal- Pudding Cup -- Pharyngeal -- Pharyngeal- Honey Teaspoon -- Pharyngeal -- Pharyngeal- Honey Cup -- Pharyngeal -- Pharyngeal- Nectar Teaspoon -- Pharyngeal -- Pharyngeal- Nectar Cup WFL Pharyngeal -- Pharyngeal- Nectar Straw WFL Pharyngeal -- Pharyngeal- Thin Teaspoon WFL Pharyngeal -- Pharyngeal- Thin Cup WFL;Pharyngeal residue - pyriform  Pharyngeal -- Pharyngeal- Thin Straw WFL;Pharyngeal residue - pyriform Pharyngeal -- Pharyngeal- Puree -- Pharyngeal -- Pharyngeal- Mechanical Soft -- Pharyngeal -- Pharyngeal- Regular WFL Pharyngeal -- Pharyngeal- Multi-consistency -- Pharyngeal -- Pharyngeal- Pill WFL Pharyngeal -- Pharyngeal Comment --  CHL IP CERVICAL ESOPHAGEAL PHASE 03/31/2018 Cervical Esophageal Phase Impaired Pudding Teaspoon -- Pudding Cup -- Honey Teaspoon -- Honey Cup -- Nectar Teaspoon -- Nectar Cup -- Nectar Straw -- Thin Teaspoon -- Thin Cup -- Thin Straw -- Puree -- Mechanical Soft -- Regular -- Multi-consistency -- Pill -- Cervical Esophageal Comment delayed clearance through esophagus with puree x1, liquid swallow appeared to faciliate clearance  Mackenzie Mendoza 03/31/2018, 11:52 AM  Luanna Salk, MS [facility] SLP Acute Rehab Services Pager [phone] Office [phone]               The results of significant diagnostics from this hospitalization (including imaging, microbiology, ancillary and laboratory) are  listed below for reference.     Microbiology: Recent Results (from the past 240 hour(s))  Culture, blood (Routine X 2) w Reflex to ID Panel     Status: None (Preliminary result)   Collection Time: 03/29/18 11:45 AM  Result Value Ref Range Status   Specimen Description   Final    BLOOD RIGHT ANTECUBITAL Performed at [facility] [address], [place], [place] [address]    Special Requests   Final    BOTTLES DRAWN AEROBIC AND ANAEROBIC Blood Culture adequate volume Performed at [place] [facility] [address] [place], [place] [address]    Culture   Final    NO GROWTH 1 DAY Performed at [facility], [place] [address], [place], [place] [address]    Report Status PENDING  Incomplete  Culture, blood (Routine X 2) w Reflex to ID Panel     Status: None (Preliminary result)   Collection Time: 03/29/18 11:46 AM  Result Value Ref Range Status   Specimen Description   Final    BLOOD LEFT ANTECUBITAL Performed at [facility] [address], [place], [place] [address]    Special Requests   Final    Blood Culture adequate volume Performed at [facility] [address], [place], [place] [address]    Culture   Final    NO GROWTH 1 DAY Performed at [facility], [place] [address], [place], [place] [address]    Report Status PENDING  Incomplete  Respiratory Panel by PCR     Status: None   Collection Time: 03/29/18  4:30 PM  Result Value Ref Range Status   Adenovirus NOT DETECTED NOT DETECTED Final   Coronavirus 229E NOT DETECTED NOT DETECTED Final   Coronavirus HKU1 NOT DETECTED NOT DETECTED Final   Coronavirus NL63 NOT DETECTED NOT DETECTED Final   Coronavirus OC43 NOT DETECTED NOT DETECTED Final   Metapneumovirus NOT DETECTED NOT DETECTED Final   Rhinovirus / Enterovirus NOT DETECTED NOT DETECTED Final   Influenza A NOT DETECTED NOT DETECTED Final   Influenza B NOT DETECTED NOT DETECTED Final    Parainfluenza Virus 1 NOT DETECTED NOT DETECTED Final   Parainfluenza Virus 2 NOT DETECTED NOT DETECTED Final   Parainfluenza Virus 3 NOT DETECTED NOT DETECTED Final   Parainfluenza Virus 4 NOT DETECTED NOT DETECTED Final   Respiratory Syncytial Virus NOT DETECTED NOT DETECTED Final   Bordetella pertussis NOT DETECTED NOT DETECTED Final   Chlamydophila pneumoniae NOT DETECTED NOT DETECTED Final   Mycoplasma pneumoniae NOT  DETECTED NOT DETECTED Final    Comment: Performed at [facility], [place] [address], [place], [place] [address]  MRSA PCR Screening     Status: None   Collection Time: 03/30/18  9:37 AM  Result Value Ref Range Status   MRSA by PCR NEGATIVE NEGATIVE Final    Comment:        The GeneXpert MRSA Assay (FDA approved for NASAL specimens only), is one component of a comprehensive MRSA colonization surveillance program. It is not intended to diagnose MRSA infection nor to guide or monitor treatment for MRSA infections. Performed at [facility], [place] [address], [place], [place] [address]      Labs: BNP (last 3 results) Recent Labs    10/13/17 1305  BNP 841.6*   Basic Metabolic Panel: Recent Labs  Lab 03/29/18 1046 03/31/18 0558  NA 134* 136  K 3.9 3.7  CL 99 100  CO2 29 29  GLUCOSE 109* 134*  BUN 11 11  CREATININE 0.94 0.85  CALCIUM 9.1 8.8*   Liver Function Tests: No results for input(s): AST, ALT, ALKPHOS, BILITOT, PROT, ALBUMIN in the last 168 hours. No results for input(s): LIPASE, AMYLASE in the last 168 hours. No results for input(s): AMMONIA in the last 168 hours. CBC: Recent Labs  Lab 03/29/18 1046 03/31/18 0558  WBC 12.7* 7.8  NEUTROABS 10.4*  --   HGB 11.2* 9.5*  HCT 34.6* 29.3*  MCV 91.8 92.7  PLT 281 296   Cardiac Enzymes: Recent Labs  Lab 03/29/18 1046  TROPONINI <0.03   BNP: Invalid input(s): POCBNP CBG: No results for input(s): GLUCAP in the last 168 hours. D-Dimer No results for input(s):  DDIMER in the last 72 hours. Hgb A1c No results for input(s): HGBA1C in the last 72 hours. Lipid Profile No results for input(s): CHOL, HDL, LDLCALC, TRIG, CHOLHDL, LDLDIRECT in the last 72 hours. Thyroid function studies No results for input(s): TSH, T4TOTAL, T3FREE, THYROIDAB in the last 72 hours.  Invalid input(s): FREET3 Anemia work up No results for input(s): VITAMINB12, FOLATE, FERRITIN, TIBC, IRON, RETICCTPCT in the last 72 hours. Urinalysis    Component Value Date/Time   COLORURINE YELLOW 03/29/2018 [place] 03/29/2018 1047   LABSPEC 1.005 03/29/2018 1047   PHURINE 7.0 03/29/2018 1047   GLUCOSEU NEGATIVE 03/29/2018 1047   HGBUR SMALL (A) 03/29/2018 1047   BILIRUBINUR NEGATIVE 03/29/2018 1047   KETONESUR 5 (A) 03/29/2018 1047   PROTEINUR NEGATIVE 03/29/2018 1047   UROBILINOGEN 1.0 08/11/2014 2239   NITRITE NEGATIVE 03/29/2018 1047   LEUKOCYTESUR NEGATIVE 03/29/2018 1047   Sepsis Labs Invalid input(s): PROCALCITONIN,  WBC,  LACTICIDVEN Microbiology Recent Results (from the past 240 hour(s))  Culture, blood (Routine X 2) w Reflex to ID Panel     Status: None (Preliminary result)   Collection Time: 03/29/18 11:45 AM  Result Value Ref Range Status   Specimen Description   Final    BLOOD RIGHT ANTECUBITAL Performed at [facility], [place] [address], [place], [place] [address]    Special Requests   Final    BOTTLES DRAWN AEROBIC AND ANAEROBIC Blood Culture adequate volume Performed at [place] [address], [place], [place] [address]    Culture   Final    NO GROWTH 1 DAY Performed at [facility], [place] [address], [place], [place] [address]    Report Status PENDING  Incomplete  Culture, blood (Routine X 2) w Reflex to ID  Panel     Status: None (Preliminary result)   Collection Time: 03/29/18 11:46 AM  Result Value Ref Range Status   Specimen Description   Final    BLOOD LEFT ANTECUBITAL Performed at  [facility] [place] [address] [place], [place] [address]    Special Requests   Final    Blood Culture adequate volume Performed at [place] [address], [place], [place] [address]    Culture   Final    NO GROWTH 1 DAY Performed at [facility], [place] [address], [place], [place] [address]    Report Status PENDING  Incomplete  Respiratory Panel by PCR     Status: None   Collection Time: 03/29/18  4:30 PM  Result Value Ref Range Status   Adenovirus NOT DETECTED NOT DETECTED Final   Coronavirus 229E NOT DETECTED NOT DETECTED Final   Coronavirus HKU1 NOT DETECTED NOT DETECTED Final   Coronavirus NL63 NOT DETECTED NOT DETECTED Final   Coronavirus OC43 NOT DETECTED NOT DETECTED Final   Metapneumovirus NOT DETECTED NOT DETECTED Final   Rhinovirus / Enterovirus NOT DETECTED NOT DETECTED Final   Influenza A NOT DETECTED NOT DETECTED Final   Influenza B NOT DETECTED NOT DETECTED Final   Parainfluenza Virus 1 NOT DETECTED NOT DETECTED Final   Parainfluenza Virus 2 NOT DETECTED NOT DETECTED Final   Parainfluenza Virus 3 NOT DETECTED NOT DETECTED Final   Parainfluenza Virus 4 NOT DETECTED NOT DETECTED Final   Respiratory Syncytial Virus NOT DETECTED NOT DETECTED Final   Bordetella pertussis NOT DETECTED NOT DETECTED Final   Chlamydophila pneumoniae NOT DETECTED NOT DETECTED Final   Mycoplasma pneumoniae NOT DETECTED NOT DETECTED Final    Comment: Performed at [facility], [place] [address], [place], [place] [address]  MRSA PCR Screening     Status: None   Collection Time: 03/30/18  9:37 AM  Result Value Ref Range Status   MRSA by PCR NEGATIVE NEGATIVE Final    Comment:        The GeneXpert MRSA Assay (FDA approved for NASAL specimens only), is one component of a comprehensive MRSA colonization surveillance program. It is not intended to diagnose MRSA infection nor to guide or monitor treatment for MRSA infections. Performed at  [facility], [place] [address], [place], [place] [address]      Time coordinating discharge in minutes: 81  SIGNED:   Debbe Odea, MD  [facility] 03/31/2018, 3:19 PM Pager   If 7PM-7AM, please contact night-coverage [email or web address] Password [email or web address]

## 2018-04-03 LAB — CULTURE, BLOOD (ROUTINE X 2)
CULTURE: NO GROWTH
Culture: NO GROWTH
SPECIAL REQUESTS: ADEQUATE
SPECIAL REQUESTS: ADEQUATE

## 2018-04-08 DIAGNOSIS — J189 Pneumonia, unspecified organism: Secondary | ICD-10-CM | POA: Diagnosis not present

## 2018-05-26 ENCOUNTER — Emergency Department (HOSPITAL_COMMUNITY): Payer: Medicare Other

## 2018-05-26 ENCOUNTER — Other Ambulatory Visit: Payer: Self-pay

## 2018-05-26 ENCOUNTER — Inpatient Hospital Stay (HOSPITAL_COMMUNITY)
Admission: EM | Admit: 2018-05-26 | Discharge: 2018-05-30 | DRG: 871 | Disposition: A | Discharge status: Home Health | Payer: Medicare Other | Attending: Internal Medicine | Admitting: Internal Medicine

## 2018-05-26 ENCOUNTER — Encounter (HOSPITAL_COMMUNITY): Payer: Self-pay | Admitting: *Deleted

## 2018-05-26 DIAGNOSIS — J440 Chronic obstructive pulmonary disease with acute lower respiratory infection: Secondary | ICD-10-CM | POA: Diagnosis present

## 2018-05-26 DIAGNOSIS — Z90710 Acquired absence of both cervix and uterus: Secondary | ICD-10-CM

## 2018-05-26 DIAGNOSIS — Z885 Allergy status to narcotic agent status: Secondary | ICD-10-CM

## 2018-05-26 DIAGNOSIS — Z8509 Personal history of malignant neoplasm of other digestive organs: Secondary | ICD-10-CM

## 2018-05-26 DIAGNOSIS — Z681 Body mass index (BMI) 19 or less, adult: Secondary | ICD-10-CM | POA: Diagnosis not present

## 2018-05-26 DIAGNOSIS — D649 Anemia, unspecified: Secondary | ICD-10-CM | POA: Diagnosis not present

## 2018-05-26 DIAGNOSIS — Z801 Family history of malignant neoplasm of trachea, bronchus and lung: Secondary | ICD-10-CM

## 2018-05-26 DIAGNOSIS — F419 Anxiety disorder, unspecified: Secondary | ICD-10-CM | POA: Diagnosis present

## 2018-05-26 DIAGNOSIS — C241 Malignant neoplasm of ampulla of Vater: Secondary | ICD-10-CM | POA: Diagnosis not present

## 2018-05-26 DIAGNOSIS — Z9049 Acquired absence of other specified parts of digestive tract: Secondary | ICD-10-CM

## 2018-05-26 DIAGNOSIS — J449 Chronic obstructive pulmonary disease, unspecified: Secondary | ICD-10-CM | POA: Diagnosis not present

## 2018-05-26 DIAGNOSIS — K219 Gastro-esophageal reflux disease without esophagitis: Secondary | ICD-10-CM | POA: Diagnosis present

## 2018-05-26 DIAGNOSIS — J189 Pneumonia, unspecified organism: Secondary | ICD-10-CM | POA: Diagnosis not present

## 2018-05-26 DIAGNOSIS — Z803 Family history of malignant neoplasm of breast: Secondary | ICD-10-CM

## 2018-05-26 DIAGNOSIS — R05 Cough: Secondary | ICD-10-CM | POA: Diagnosis not present

## 2018-05-26 DIAGNOSIS — Z9221 Personal history of antineoplastic chemotherapy: Secondary | ICD-10-CM

## 2018-05-26 DIAGNOSIS — Z8701 Personal history of pneumonia (recurrent): Secondary | ICD-10-CM

## 2018-05-26 DIAGNOSIS — A419 Sepsis, unspecified organism: Secondary | ICD-10-CM | POA: Diagnosis not present

## 2018-05-26 DIAGNOSIS — E43 Unspecified severe protein-calorie malnutrition: Secondary | ICD-10-CM | POA: Diagnosis not present

## 2018-05-26 DIAGNOSIS — F17210 Nicotine dependence, cigarettes, uncomplicated: Secondary | ICD-10-CM | POA: Diagnosis present

## 2018-05-26 DIAGNOSIS — M810 Age-related osteoporosis without current pathological fracture: Secondary | ICD-10-CM | POA: Diagnosis present

## 2018-05-26 DIAGNOSIS — Z886 Allergy status to analgesic agent status: Secondary | ICD-10-CM

## 2018-05-26 DIAGNOSIS — Y95 Nosocomial condition: Secondary | ICD-10-CM | POA: Diagnosis present

## 2018-05-26 DIAGNOSIS — R079 Chest pain, unspecified: Secondary | ICD-10-CM | POA: Diagnosis not present

## 2018-05-26 DIAGNOSIS — Z90411 Acquired partial absence of pancreas: Secondary | ICD-10-CM

## 2018-05-26 DIAGNOSIS — Z888 Allergy status to other drugs, medicaments and biological substances status: Secondary | ICD-10-CM

## 2018-05-26 LAB — BASIC METABOLIC PANEL
ANION GAP: 12 (ref 5–15)
BUN: 14 mg/dL (ref 8–23)
CHLORIDE: 99 mmol/L (ref 98–111)
CO2: 24 mmol/L (ref 22–32)
Calcium: 8.9 mg/dL (ref 8.9–10.3)
Creatinine, Ser: 0.99 mg/dL (ref 0.44–1.00)
GFR calc Af Amer: >60 mL/min (ref >60)
GFR calc non Af Amer: 53 mL/min — ABNORMAL LOW (ref >60)
Glucose, Bld: 149 mg/dL — ABNORMAL HIGH (ref 70–99)
POTASSIUM: 4.3 mmol/L (ref 3.5–5.1)
SODIUM: 135 mmol/L (ref 135–145)

## 2018-05-26 LAB — CBC
HEMATOCRIT: 35 % — AB (ref 36.0–46.0)
HEMOGLOBIN: 11.2 g/dL — AB (ref 12.0–15.0)
MCH: 30.3 pg (ref 26.0–34.0)
MCHC: 32 g/dL (ref 30.0–36.0)
MCV: 94.6 fL (ref 80.0–100.0)
Platelets: 171 10*3/uL (ref 150–400)
RBC: 3.7 MIL/uL — AB (ref 3.87–5.11)
RDW: 13.2 % (ref 11.5–15.5)
WBC: 16.1 10*3/uL — AB (ref 4.0–10.5)
nRBC: 0 % (ref 0.0–0.2)

## 2018-05-26 LAB — I-STAT CG4 LACTIC ACID, ED: LACTIC ACID, VENOUS: 2.47 mmol/L — AB (ref 0.5–1.9)

## 2018-05-26 LAB — LACTIC ACID, PLASMA: Lactic Acid, Venous: 1.9 mmol/L (ref 0.5–1.9)

## 2018-05-26 LAB — PROCALCITONIN: PROCALCITONIN: 0.35 ng/mL

## 2018-05-26 LAB — TROPONIN I

## 2018-05-26 MED ORDER — AZITHROMYCIN 250 MG PO TABS
250.0000 mg | ORAL_TABLET | Freq: Every day | ORAL | Status: AC
  Administered 2018-05-27 – 2018-05-30 (×4): 250 mg via ORAL
  Filled 2018-05-26 (×4): qty 1

## 2018-05-26 MED ORDER — LORAZEPAM 1 MG PO TABS
0.5000 mg | ORAL_TABLET | Freq: Two times a day (BID) | ORAL | Status: DC | PRN
  Administered 2018-05-29 – 2018-05-30 (×2): 1 mg via ORAL
  Filled 2018-05-26 (×2): qty 1

## 2018-05-26 MED ORDER — VITAMIN B-12 100 MCG PO TABS
100.0000 ug | ORAL_TABLET | Freq: Every day | ORAL | Status: DC
  Administered 2018-05-27 – 2018-05-30 (×4): 100 ug via ORAL
  Filled 2018-05-26 (×4): qty 1

## 2018-05-26 MED ORDER — VANCOMYCIN HCL IN DEXTROSE 1-5 GM/200ML-% IV SOLN
1000.0000 mg | Freq: Once | INTRAVENOUS | Status: AC
  Administered 2018-05-26: 1000 mg via INTRAVENOUS
  Filled 2018-05-26: qty 200

## 2018-05-26 MED ORDER — SODIUM CHLORIDE 0.9 % IV BOLUS
500.0000 mL | Freq: Once | INTRAVENOUS | Status: AC
  Administered 2018-05-26: 500 mL via INTRAVENOUS

## 2018-05-26 MED ORDER — SODIUM CHLORIDE 0.9 % IV BOLUS
1000.0000 mL | Freq: Once | INTRAVENOUS | Status: AC
  Administered 2018-05-26: 1000 mL via INTRAVENOUS

## 2018-05-26 MED ORDER — PANTOPRAZOLE SODIUM 40 MG PO TBEC
40.0000 mg | DELAYED_RELEASE_TABLET | Freq: Every day | ORAL | Status: DC
  Administered 2018-05-27 – 2018-05-30 (×4): 40 mg via ORAL
  Filled 2018-05-26 (×4): qty 1

## 2018-05-26 MED ORDER — ALBUTEROL SULFATE (2.5 MG/3ML) 0.083% IN NEBU: 2.5000 mg | INHALATION_SOLUTION | RESPIRATORY_TRACT | Status: DC | PRN

## 2018-05-26 MED ORDER — RISAQUAD PO CAPS
1.0000 | ORAL_CAPSULE | Freq: Every day | ORAL | Status: DC
  Administered 2018-05-27 – 2018-05-30 (×4): 1 via ORAL
  Filled 2018-05-26 (×4): qty 1

## 2018-05-26 MED ORDER — SODIUM CHLORIDE 0.9 % IV SOLN
2.0000 g | Freq: Once | INTRAVENOUS | Status: AC
  Administered 2018-05-26: 2 g via INTRAVENOUS
  Filled 2018-05-26: qty 2

## 2018-05-26 MED ORDER — BOOST / RESOURCE BREEZE PO LIQD: 1.0000 | Freq: Three times a day (TID) | ORAL | Status: DC

## 2018-05-26 MED ORDER — NICOTINE 21 MG/24HR TD PT24
21.0000 mg | MEDICATED_PATCH | Freq: Every day | TRANSDERMAL | Status: DC
  Administered 2018-05-27 – 2018-05-30 (×4): 21 mg via TRANSDERMAL
  Filled 2018-05-26 (×4): qty 1

## 2018-05-26 MED ORDER — DM-GUAIFENESIN ER 30-600 MG PO TB12
1.0000 | ORAL_TABLET | Freq: Two times a day (BID) | ORAL | Status: DC | PRN
  Filled 2018-05-26: qty 1

## 2018-05-26 MED ORDER — VITAMIN D 25 MCG (1000 UNIT) PO TABS
2000.0000 [IU] | ORAL_TABLET | ORAL | Status: DC
  Administered 2018-05-27 – 2018-05-29 (×2): 2000 [IU] via ORAL
  Filled 2018-05-26 (×2): qty 2

## 2018-05-26 MED ORDER — AZITHROMYCIN 500 MG PO TABS
500.0000 mg | ORAL_TABLET | Freq: Every day | ORAL | Status: AC
  Administered 2018-05-27: 500 mg via ORAL
  Filled 2018-05-26: qty 1

## 2018-05-26 MED ORDER — SODIUM CHLORIDE 0.9 % IV SOLN
INTRAVENOUS | Status: DC
  Administered 2018-05-26 – 2018-05-29 (×5): via INTRAVENOUS

## 2018-05-26 MED ORDER — VANCOMYCIN HCL 500 MG IV SOLR
500.0000 mg | INTRAVENOUS | Status: DC
  Administered 2018-05-27: 500 mg via INTRAVENOUS
  Filled 2018-05-26 (×2): qty 500

## 2018-05-26 MED ORDER — ENOXAPARIN SODIUM 40 MG/0.4ML ~~LOC~~ SOLN
40.0000 mg | SUBCUTANEOUS | Status: DC
  Administered 2018-05-27: 40 mg via SUBCUTANEOUS
  Filled 2018-05-26: qty 0.4

## 2018-05-26 MED ORDER — FERROUS SULFATE 325 (65 FE) MG PO TABS
325.0000 mg | ORAL_TABLET | ORAL | Status: DC
  Administered 2018-05-27 – 2018-05-29 (×2): 325 mg via ORAL
  Filled 2018-05-26 (×3): qty 1

## 2018-05-26 MED ORDER — SODIUM CHLORIDE 0.9 % IV SOLN
1.0000 g | Freq: Three times a day (TID) | INTRAVENOUS | Status: DC
  Administered 2018-05-27: 1 g via INTRAVENOUS
  Filled 2018-05-26 (×3): qty 1

## 2018-05-26 NOTE — ED Notes (Signed)
Lactic acid 2.47 reported to Dr. Rex Kras.

## 2018-05-26 NOTE — ED Notes (Signed)
Attempted report x1. 

## 2018-05-26 NOTE — ED Provider Notes (Signed)
[place] EMERGENCY DEPARTMENT Provider Note   CSN: [id number] Arrival date & time: 05/26/18  1832     History   Chief Complaint Chief Complaint  Patient presents with  . Chest Pain    HPI Mackenzie Mendoza is a 84 y.o. female.  The history is provided by the patient, a relative and medical records. No language interpreter was used.  Chest Pain     Mackenzie Mendoza is a 84 y.o. female who presents to the Emergency Department complaining of chest pain. He presents to the emergency department complaining of chest pain, cough and weakness for the last few days. She did have a temperature to 100 today. She has a history of recurrent pneumonia and was hospitalized one month ago for similar symptoms. She denies any hemoptysis, abdominal pain, vomiting, diarrhea, leg swelling or pain. She does have nausea. She was admitted one month ago for pneumonia with similar sxs.  She lives at home with her husband.  She is accompanied to the ED by her grandson.   Past Medical History:  Diagnosis Date  . Cancer [facility])     Patient Active Problem List   Diagnosis Date Noted  . HCAP (healthcare-associated pneumonia) 05/26/2018  . COPD (chronic obstructive pulmonary disease) ([place]) 03/30/2018  . Anxiety 03/30/2018  . Multifocal pneumonia 03/29/2018  . Community acquired pneumonia 10/13/2017  . Leucocytosis 10/13/2017  . Pneumonia 10/13/2017  . Dehydration   . Protein-calorie malnutrition, severe ([place])   . Sepsis ([place]) 05/27/2017  . Lobar pneumonia ([place]) 05/27/2017  . Nausea & vomiting 05/27/2017  . Hypokalemia 05/27/2017  . GERD (gastroesophageal reflux disease) 05/27/2017  . Abdominal pain 08/09/2016  . Depression 08/09/2016  . Acute respiratory failure with hypoxia ([place]) 08/09/2016  . Weight loss 03/05/2012  . 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 Osteoporosis 09/05/2011  . Normocytic anemia 09/05/2011  . Other B-complex deficiencies  09/05/2011    Past Surgical History:  Procedure Laterality Date  . ABDOMINAL HYSTERECTOMY    . CHOLECYSTECTOMY    . PANCREATICODUODENECTOMY     [facility].  Neoadj chemoXRT, Whipple 2007-2008     OB History   No obstetric history on file.      Home Medications    Prior to Admission medications   Medication Sig Start Date End Date Taking? Authorizing Provider  acetaminophen (TYLENOL) 500 MG tablet Take 1 tablet (500 mg total) by mouth every 6 (six) hours as needed for moderate pain or headache. Patient taking differently: Take 1,000 mg by mouth every 6 (six) hours as needed for moderate pain or headache.  08/14/16  Yes Regalado, Belkys A, MD  albuterol (PROVENTIL HFA;VENTOLIN HFA) 108 (90 Base) MCG/ACT inhaler Inhale 1-2 puffs into the lungs every 6 (six) hours as needed for wheezing or shortness of breath. 03/31/18 05/26/26 Yes Debbe Odea, MD  cholecalciferol (VITAMIN D) 1000 units tablet Take 2,000 Units by mouth 3 (three) times a week.    Yes [provider]  Cyanocobalamin (VITAMIN B-12 PO) Take 1 tablet by mouth daily.   Yes [provider]  ferrous sulfate 325 (65 FE) MG tablet Take 325 mg by mouth every other day.    Yes [provider]  LORazepam (ATIVAN) 1 MG tablet Take 0.5-1 mg by mouth 2 (two) times daily as needed for anxiety.    Yes [provider]  pantoprazole (PROTONIX) 40 MG tablet Take 1 tablet (40 mg total) by mouth 2 (two) times daily before  a meal. Patient taking differently: Take 40 mg by mouth daily.  08/14/16  Yes Regalado, Belkys A, MD  Probiotic Product (PROBIOTIC PO) Take 1 capsule by mouth daily.    Yes [provider]  dextromethorphan-guaiFENesin (MUCINEX DM) 30-600 MG 12hr tablet Take 1 tablet by mouth 2 (two) times daily. Patient not taking: Reported on 05/26/2018 05/30/17   Debbe Odea, MD  feeding supplement (BOOST / RESOURCE BREEZE) LIQD Take 1 Container by mouth 3 (three) times daily between meals. Patient  not taking: Reported on 05/26/2018 08/14/16   Elmarie Shiley, MD    Family History Family History  Problem Relation Age of Onset  . Lung cancer Brother   . Breast cancer Sister     Social History Social History   Tobacco Use  . Smoking status: Current Every Day Smoker    Packs/day: 1.00    Types: Cigarettes  . Smokeless tobacco: Never Used  Substance Use Topics  . Alcohol use: No  . Drug use: No     Allergies   Adhesive [tape]; Aspirin; Codeine-promethazine [promethazine-codeine]; Estrogens; and Phenobarbital   Review of Systems Review of Systems  Cardiovascular: Positive for chest pain.  All other systems reviewed and are negative.    Physical Exam Updated Vital Signs BP 118/67 (BP Location: Left Arm)   Pulse 90   Temp 99.5 F (37.5 C) (Oral)   Resp (!) 24   Ht 5\' 6"  (1.676 m)   Wt 44.5 kg   SpO2 92%   BMI 15.82 kg/m   Physical Exam Vitals signs and nursing note reviewed.  Constitutional:      Appearance: She is well-developed.     Comments: Frail, thin  HENT:     Head: Normocephalic and atraumatic.  Cardiovascular:     Rate and Rhythm: Normal rate and regular rhythm.     Heart sounds: No murmur.  Pulmonary:     Effort: Pulmonary effort is normal. No respiratory distress.     Comments: Crackles in the left lung base Abdominal:     Palpations: Abdomen is soft.     Tenderness: There is no abdominal tenderness. There is no guarding or rebound.  Musculoskeletal:        General: No tenderness.  Skin:    General: Skin is warm and dry.     Capillary Refill: Capillary refill takes less than 2 seconds.  Neurological:     Mental Status: She is alert and oriented to person, place, and time.  Psychiatric:        Behavior: Behavior normal.      ED Treatments / Results  Labs (all labs ordered are listed, but only abnormal results are displayed) Labs Reviewed  BASIC METABOLIC PANEL - Abnormal; Notable for the following components:      Result  Value   Glucose, Bld 149 (*)    GFR calc non Af Amer 53 (*)    All other components within normal limits  CBC - Abnormal; Notable for the following components:   WBC 16.1 (*)    RBC 3.70 (*)    Hemoglobin 11.2 (*)    HCT 35.0 (*)    All other components within normal limits  I-STAT CG4 LACTIC ACID, ED - Abnormal; Notable for the following components:   Lactic Acid, Venous 2.47 (*)    All other components within normal limits  CULTURE, BLOOD (ROUTINE X 2)  CULTURE, BLOOD (ROUTINE X 2)  EXPECTORATED SPUTUM ASSESSMENT W REFEX TO RESP CULTURE  GRAM STAIN  TROPONIN I  LACTIC ACID, PLASMA  PROCALCITONIN  LACTIC ACID, PLASMA  STREP PNEUMONIAE URINARY ANTIGEN  LEGIONELLA PNEUMOPHILA SEROGP 1 UR AG  INFLUENZA PANEL BY PCR (TYPE A & B)    EKG None  Radiology Dg Chest 2 View  Result Date: 05/26/2018 CLINICAL DATA:  Chest pain, cough, fever EXAM: CHEST - 2 VIEW COMPARISON:  03/29/2018 chest radiograph. FINDINGS: Stable cardiomediastinal silhouette with normal heart size and moderate hiatal hernia. No pneumothorax. No pleural effusion. Hyperinflated lungs. No pulmonary edema. Mild patchy right lung base opacity, with a slightly different distribution from prior. Chronic lower thoracic/upper lumbar vertebral compression fractures status post vertebroplasty. IMPRESSION: 1. Mild patchy right lung base opacity, slightly different distribution from prior, compatible with pneumonia or atelectasis. Recommend follow-up PA and lateral post treatment chest radiographs in 4-6 weeks. 2. Hyperinflated lungs, suggesting COPD. 3. Moderate hiatal hernia. Electronically Signed   By: Ilona Sorrel M.D.   On: 05/26/2018 20:14    Procedures Procedures (including critical care time)  Medications Ordered in ED Medications  dextromethorphan-guaiFENesin (MUCINEX DM) 30-600 MG per 12 hr tablet 1 tablet (has no administration in time range)  LORazepam (ATIVAN) tablet 0.5-1 mg (has no administration in time range)    pantoprazole (PROTONIX) EC tablet 40 mg (has no administration in time range)  vitamin B-12 (CYANOCOBALAMIN) tablet 100 mcg (has no administration in time range)  acidophilus (RISAQUAD) capsule 1 capsule (has no administration in time range)  ferrous sulfate tablet 325 mg (has no administration in time range)  cholecalciferol (VITAMIN D3) tablet 2,000 Units (has no administration in time range)  azithromycin (ZITHROMAX) tablet 500 mg (has no administration in time range)    Followed by  azithromycin (ZITHROMAX) tablet 250 mg (has no administration in time range)  nicotine (NICODERM CQ - dosed in mg/24 hours) patch 21 mg (has no administration in time range)  albuterol (PROVENTIL) (2.5 MG/3ML) 0.083% nebulizer solution 2.5 mg (has no administration in time range)  0.9 %  sodium chloride infusion ( Intravenous New Bag/Given 05/27/18 0037)  enoxaparin (LOVENOX) injection 40 mg (has no administration in time range)  ceFEPIme (MAXIPIME) 1 g in sodium chloride 0.9 % 100 mL IVPB (1 g Intravenous New Bag/Given 05/27/18 0033)  vancomycin (VANCOCIN) 500 mg in sodium chloride 0.9 % 100 mL IVPB (has no administration in time range)  ceFEPIme (MAXIPIME) 2 g in sodium chloride 0.9 % 100 mL IVPB (0 g Intravenous Stopped 05/26/18 2251)  sodium chloride 0.9 % bolus 500 mL (0 mLs Intravenous Stopped 05/26/18 2252)  vancomycin (VANCOCIN) IVPB 1000 mg/200 mL premix (1,000 mg Intravenous Transfusing/Transfer 05/26/18 2306)  sodium chloride 0.9 % bolus 1,000 mL (1,000 mLs Intravenous Transfusing/Transfer 05/26/18 2319)     Initial Impression / Assessment and Plan / ED Course  I have reviewed the triage vital signs and the nursing notes.  Pertinent labs & imaging results that were available during my care of the patient were reviewed by me and considered in my medical decision making (see chart for details).     Patient with history of COPD and pneumonia here for evaluation of fever, chest pain, cough.  Imaging and exam is consistent with recurrent pneumonia. She is chronically ill appearing on examination. She was treated with IV fluids and antibiotics. She was treated for HCAP given her recent hospitalizations and comorbidities. D/w pt and family findings of studies and recommendation for admission and they are in agreement with treatment plan.  Hospitalist consulted for admission.    Final Clinical Impressions(s) /  ED Diagnoses   Final diagnoses:  Recurrent pneumonia    ED Discharge Orders    None       Quintella Reichert, MD 05/27/18 0110

## 2018-05-26 NOTE — ED Triage Notes (Signed)
The pt is c/o generalized chest pain and a temp since yesterday  She thinks she has pneumonia.  Last temp before she arrived here was 100

## 2018-05-26 NOTE — ED Notes (Signed)
Awaiting BC to begin antibiotic administration.

## 2018-05-26 NOTE — H&P (Signed)
History and Physical    Mackenzie Mendoza HQI:[id number] DOB: Oct 29, 1933 DOA: 05/26/2018  Referring MD/NP/PA:   PCP: Lavone Orn, MD   Patient coming from:  The patient is coming from home.  At baseline, pt is independent for most of ADL.        Chief Complaint: fever, chest pan and cough  HPI: Mackenzie Mendoza is a 84 y.o. female with medical history significant of  cancer of the ampulla of Vater (s/p of Whipple procedure), GERD, iron deficiency anemia, tobacco abuse, chronic abdominal pain, who presents with cough, chest pain and fever.  Pt states that her symptoms started yesterday, including fever, cough and chest pain.  She has dry cough, with mild shortness of breath.  Her chest pain is located in the left side of the chest, constant, moderate, nonradiating, pleuritic, aggravated by coughing.  She had fever of 100 at home. Patient has chronic mild abdominal pain.  She also has nausea, but no vomiting or diarrhea.  Denies symptoms of UTI or unilateral weakness.  No choking on eating or drinking.  Of note, patient was recently hospitalized from 11/1-11/3 due to multifocal pneumonia.  She has been doing okay until yesterday.  ED Course: pt was found to have WBC 16.1, lactic acid 2.47, electrolytes renal function okay, temperature normal, no tachycardia, has tachypnea, oxygen saturation 96% on room air.  Chest x-ray with right basilar infiltration and COPD.  Patient is placed on [facility] [place] for observation.  Review of Systems:   General: Has subjective fevers, chills, no body weight gain, has poor appetite, has fatigue HEENT: no blurry vision, hearing changes or sore throat Respiratory: Has dyspnea, coughing, no wheezing CV: Has chest pain, no palpitations GI: Has nausea and abdominal pain, no diarrhea, constipation or vomiting GU: no dysuria, burning on urination, increased urinary frequency, hematuria  Ext: no leg edema Neuro: no unilateral weakness, numbness, or tingling, no  vision change or hearing loss Skin: no rash, no skin tear. MSK: No muscle spasm, no deformity, no limitation of range of movement in spin Heme: No easy bruising.  Travel history: No recent long distant travel.  Allergy:  Allergies  Allergen Reactions  . Adhesive [Tape] Other (See Comments)    TAPE TEARS THE SKIN VERY EASILY (it's thin!!)  . Aspirin Hives  . Codeine-Promethazine [Promethazine-Codeine] Nausea And Vomiting  . Estrogens Hives  . Phenobarbital Itching    Past Medical History:  Diagnosis Date  . Cancer [facility])     Past Surgical History:  Procedure Laterality Date  . ABDOMINAL HYSTERECTOMY    . CHOLECYSTECTOMY    . PANCREATICODUODENECTOMY     [facility].  Neoadj chemoXRT, Whipple 2007-2008    Social History:  reports that she has been smoking cigarettes. She has been smoking about 1.00 pack per day. She has never used smokeless tobacco. She reports that she does not drink alcohol or use drugs.  Family History:  Family History  Problem Relation Age of Onset  . Lung cancer Brother   . Breast cancer Sister      Prior to Admission medications   Medication Sig Start Date End Date Taking? Authorizing Provider  acetaminophen (TYLENOL) 500 MG tablet Take 1 tablet (500 mg total) by mouth every 6 (six) hours as needed for moderate pain or headache. Patient taking differently: Take 1,000 mg by mouth every 6 (six) hours as needed for moderate pain or headache.  08/14/16  Yes Regalado, Belkys A, MD  albuterol (PROVENTIL HFA;VENTOLIN HFA) 108 (90 Base)  MCG/ACT inhaler Inhale 1-2 puffs into the lungs every 6 (six) hours as needed for wheezing or shortness of breath. 03/31/18 05/26/26 Yes Debbe Odea, MD  cholecalciferol (VITAMIN D) 1000 units tablet Take 2,000 Units by mouth 3 (three) times a week.    Yes [provider]  Cyanocobalamin (VITAMIN B-12 PO) Take 1 tablet by mouth daily.   Yes [provider]  ferrous sulfate 325 (65 FE) MG tablet Take 325 mg by mouth  every other day.    Yes [provider]  LORazepam (ATIVAN) 1 MG tablet Take 0.5-1 mg by mouth 2 (two) times daily as needed for anxiety.    Yes [provider]  pantoprazole (PROTONIX) 40 MG tablet Take 1 tablet (40 mg total) by mouth 2 (two) times daily before a meal. Patient taking differently: Take 40 mg by mouth daily.  08/14/16  Yes Regalado, Belkys A, MD  Probiotic Product (PROBIOTIC PO) Take 1 capsule by mouth daily.    Yes [provider]  dextromethorphan-guaiFENesin (MUCINEX DM) 30-600 MG 12hr tablet Take 1 tablet by mouth 2 (two) times daily. Patient not taking: Reported on 05/26/2018 05/30/17   Debbe Odea, MD  feeding supplement (BOOST / RESOURCE BREEZE) LIQD Take 1 Container by mouth 3 (three) times daily between meals. Patient not taking: Reported on 05/26/2018 08/14/16   Elmarie Shiley, MD    Physical Exam: Vitals:   05/26/18 2015 05/26/18 2030 05/26/18 2045 05/26/18 2122  BP: 111/71 111/61 (!) 107/52   Pulse: 83 84 81   Resp: (!) 23 (!) 23 (!) 24   Temp:      TempSrc:      SpO2: 94% 94% 96%   Weight:    44.5 kg  Height:       General: Not in acute distress.  Cachectic looking HEENT:       Eyes: PERRL, EOMI, no scleral icterus.       ENT: No discharge from the ears and nose, no pharynx injection, no tonsillar enlargement.        Neck: No JVD, no bruit, no mass felt. Heme: No neck lymph node enlargement. Cardiac: S1/S2, RRR, No murmurs, No gallops or rubs. Respiratory: Has fine crackles on the left basilar area GI: Soft, nondistended, nontender, no rebound pain, no organomegaly, BS present. GU: No hematuria Ext: No pitting leg edema bilaterally. 2+DP/PT pulse bilaterally. Musculoskeletal: No joint deformities, No joint redness or warmth, no limitation of ROM in spin. Skin: No rashes.  Neuro: Alert, oriented X3, cranial nerves II-XII grossly intact, moves all extremities normally. Psych: Patient is not psychotic, no suicidal or  hemocidal ideation.  Labs on Admission: I have personally reviewed following labs and imaging studies  CBC: Recent Labs  Lab 05/26/18 1913  WBC 16.1*  HGB 11.2*  HCT 35.0*  MCV 94.6  PLT 702   Basic Metabolic Panel: Recent Labs  Lab 05/26/18 1913  NA 135  K 4.3  CL 99  CO2 24  GLUCOSE 149*  BUN 14  CREATININE 0.99  CALCIUM 8.9   GFR: Estimated Creatinine Clearance: 30.8 mL/min (by C-G formula based on SCr of 0.99 mg/dL). Liver Function Tests: No results for input(s): AST, ALT, ALKPHOS, BILITOT, PROT, ALBUMIN in the last 168 hours. No results for input(s): LIPASE, AMYLASE in the last 168 hours. No results for input(s): AMMONIA in the last 168 hours. Coagulation Profile: No results for input(s): INR, PROTIME in the last 168 hours. Cardiac Enzymes: Recent Labs  Lab 05/26/18 1913  TROPONINI <0.03   BNP (last 3 results) No results for input(s): PROBNP in the last 8760 hours. HbA1C: No results for input(s): HGBA1C in the last 72 hours. CBG: No results for input(s): GLUCAP in the last 168 hours. Lipid Profile: No results for input(s): CHOL, HDL, LDLCALC, TRIG, CHOLHDL, LDLDIRECT in the last 72 hours. Thyroid Function Tests: No results for input(s): TSH, T4TOTAL, FREET4, T3FREE, THYROIDAB in the last 72 hours. Anemia Panel: No results for input(s): VITAMINB12, FOLATE, FERRITIN, TIBC, IRON, RETICCTPCT in the last 72 hours. Urine analysis:    Component Value Date/Time   COLORURINE YELLOW 03/29/2018 [place] 03/29/2018 1047   LABSPEC 1.005 03/29/2018 1047   PHURINE 7.0 03/29/2018 1047   GLUCOSEU NEGATIVE 03/29/2018 1047   HGBUR SMALL (A) 03/29/2018 1047   BILIRUBINUR NEGATIVE 03/29/2018 1047   KETONESUR 5 (A) 03/29/2018 1047   PROTEINUR NEGATIVE 03/29/2018 1047   UROBILINOGEN 1.0 08/11/2014 2239   NITRITE NEGATIVE 03/29/2018 1047   LEUKOCYTESUR NEGATIVE 03/29/2018 1047   Sepsis Labs: @LABRCNTIP (procalcitonin:4,lacticidven:4) )No results  found for this or any previous visit (from the past 240 hour(s)).   Radiological Exams on Admission: Dg Chest 2 View  Result Date: 05/26/2018 CLINICAL DATA:  Chest pain, cough, fever EXAM: CHEST - 2 VIEW COMPARISON:  03/29/2018 chest radiograph. FINDINGS: Stable cardiomediastinal silhouette with normal heart size and moderate hiatal hernia. No pneumothorax. No pleural effusion. Hyperinflated lungs. No pulmonary edema. Mild patchy right lung base opacity, with a slightly different distribution from prior. Chronic lower thoracic/upper lumbar vertebral compression fractures status post vertebroplasty. IMPRESSION: 1. Mild patchy right lung base opacity, slightly different distribution from prior, compatible with pneumonia or atelectasis. Recommend follow-up PA and lateral post treatment chest radiographs in 4-6 weeks. 2. Hyperinflated lungs, suggesting COPD. 3. Moderate hiatal hernia. Electronically Signed   By: Ilona Sorrel M.D.   On: 05/26/2018 20:14     EKG: Not done in ED, will get one.   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  Patient meets critical for sepsis with leukocytosis and tachypnea.  Lactic acid is elevated 2.47, hemodynamically stable currently.  - Will place on telemetry bed for obs. - IV Vancomycin and cefepime, azithromycin - Mucinex for cough  - prn Albuterol Nebs for SOB - Urine legionella and S. pneumococcal antigen - Follow up blood culture x2, sputum culture and plus Flu pcr - will get  Procalcitonin and trend lactic acid level per sepsis protocol - IVF: total of 1.5 cc of NS, followed by 75 mL per hour of NS (patient's body weight is 44.5 kg)  COPD: no wheezing or rhonchi on auscultation.  Does not seem to have COPD exacerbation - prn  Albuterol nebulizers  Normocytic anemia: hgb stable. 11.2 today -Continue iron supplement  Cancer of ampulla of Vater: s/p Whipple 2008 [place] -no acute issues  Protein-calorie malnutrition, severe ([place]) -Nutrition consult -Continue nutrition supplement  GERD: -Protonix  Tobacco abuse: -Did counseling about importance of quitting smoking -Nicotine patch  Anxiety: -Continue home PRN Ativan     DVT ppx: SQ Lovenox Code Status:  Full code Family Communication: None at bed side. Disposition Plan:  Anticipate discharge back to previous home environment Consults called:  none Admission status:   medical floor/obs     Date of Service 05/26/2018    Ivor Costa [facility] Pager [phone]  If 7PM-7AM, please contact night-coverage [email or web address] Password [facility] 05/26/2018, 9:46 PM

## 2018-05-26 NOTE — Progress Notes (Signed)
Pharmacy Antibiotic Note  Mackenzie Mendoza is a 84 y.o. female admitted on 05/26/2018 with pneumonia.  Pharmacy has been consulted for vancomycin dosing.  Presenting with fever and cough. Recently hospitalized for multifocal PNA from 11/1-11/3. WBC 16.1, LA 2.47. Afebrile. Scr 0.99 (CrCl 30 mL/min).   Plan: Vancomycin 1 g IV once  Vancomycin 500 mg IV every 24 hours Monitor clinical pic, cx results, and vancomycin levels  Height: 5\' 6"  (167.6 cm) Weight: 98 lb (44.5 kg) IBW/kg (Calculated) : 59.3  Temp (24hrs), Avg:98.5 F (36.9 C), Min:98.2 F (36.8 C), Max:98.7 F (37.1 C)  Recent Labs  Lab 05/26/18 1913 05/26/18 1937  WBC 16.1*  --   CREATININE 0.99  --   LATICACIDVEN  --  2.47*    Estimated Creatinine Clearance: 30.8 mL/min (by C-G formula based on SCr of 0.99 mg/dL).    Allergies  Allergen Reactions  . Adhesive [Tape] Other (See Comments)    TAPE TEARS THE SKIN VERY EASILY (it's thin!!)  . Aspirin Hives  . Codeine-Promethazine [Promethazine-Codeine] Nausea And Vomiting  . Estrogens Hives  . Phenobarbital Itching    Antimicrobials this admission: Vancomycin 12/29 >>  Cefepime 12/29 >>   Dose adjustments this admission: N/A  Microbiology results: 12/29 BCx: sent 12/29 Sputum: sent   Thank you for allowing pharmacy to be a part of this patient's care.  Antonietta Jewel, PharmD, [place] Clinical Pharmacist  Pager: [phone] Phone: [phone] 05/26/2018 9:34 PM

## 2018-05-27 ENCOUNTER — Other Ambulatory Visit: Payer: Self-pay

## 2018-05-27 DIAGNOSIS — Z888 Allergy status to other drugs, medicaments and biological substances status: Secondary | ICD-10-CM | POA: Diagnosis not present

## 2018-05-27 DIAGNOSIS — Z8701 Personal history of pneumonia (recurrent): Secondary | ICD-10-CM | POA: Diagnosis not present

## 2018-05-27 DIAGNOSIS — F17210 Nicotine dependence, cigarettes, uncomplicated: Secondary | ICD-10-CM | POA: Diagnosis present

## 2018-05-27 DIAGNOSIS — E43 Unspecified severe protein-calorie malnutrition: Secondary | ICD-10-CM | POA: Diagnosis present

## 2018-05-27 DIAGNOSIS — Z9049 Acquired absence of other specified parts of digestive tract: Secondary | ICD-10-CM | POA: Diagnosis not present

## 2018-05-27 DIAGNOSIS — Z885 Allergy status to narcotic agent status: Secondary | ICD-10-CM | POA: Diagnosis not present

## 2018-05-27 DIAGNOSIS — Z8509 Personal history of malignant neoplasm of other digestive organs: Secondary | ICD-10-CM | POA: Diagnosis not present

## 2018-05-27 DIAGNOSIS — Z803 Family history of malignant neoplasm of breast: Secondary | ICD-10-CM | POA: Diagnosis not present

## 2018-05-27 DIAGNOSIS — J440 Chronic obstructive pulmonary disease with acute lower respiratory infection: Secondary | ICD-10-CM | POA: Diagnosis present

## 2018-05-27 DIAGNOSIS — A419 Sepsis, unspecified organism: Secondary | ICD-10-CM | POA: Diagnosis present

## 2018-05-27 DIAGNOSIS — Z801 Family history of malignant neoplasm of trachea, bronchus and lung: Secondary | ICD-10-CM | POA: Diagnosis not present

## 2018-05-27 DIAGNOSIS — J449 Chronic obstructive pulmonary disease, unspecified: Secondary | ICD-10-CM | POA: Diagnosis not present

## 2018-05-27 DIAGNOSIS — Z90710 Acquired absence of both cervix and uterus: Secondary | ICD-10-CM | POA: Diagnosis not present

## 2018-05-27 DIAGNOSIS — Z90411 Acquired partial absence of pancreas: Secondary | ICD-10-CM | POA: Diagnosis not present

## 2018-05-27 DIAGNOSIS — K219 Gastro-esophageal reflux disease without esophagitis: Secondary | ICD-10-CM | POA: Diagnosis present

## 2018-05-27 DIAGNOSIS — F419 Anxiety disorder, unspecified: Secondary | ICD-10-CM

## 2018-05-27 DIAGNOSIS — J189 Pneumonia, unspecified organism: Secondary | ICD-10-CM | POA: Diagnosis present

## 2018-05-27 DIAGNOSIS — Z9221 Personal history of antineoplastic chemotherapy: Secondary | ICD-10-CM | POA: Diagnosis not present

## 2018-05-27 DIAGNOSIS — Z886 Allergy status to analgesic agent status: Secondary | ICD-10-CM | POA: Diagnosis not present

## 2018-05-27 DIAGNOSIS — M810 Age-related osteoporosis without current pathological fracture: Secondary | ICD-10-CM | POA: Diagnosis present

## 2018-05-27 DIAGNOSIS — Z681 Body mass index (BMI) 19 or less, adult: Secondary | ICD-10-CM | POA: Diagnosis not present

## 2018-05-27 DIAGNOSIS — Y95 Nosocomial condition: Secondary | ICD-10-CM | POA: Diagnosis present

## 2018-05-27 LAB — RESPIRATORY PANEL BY PCR

## 2018-05-27 LAB — EXPECTORATED SPUTUM ASSESSMENT W GRAM STAIN, RFLX TO RESP C

## 2018-05-27 LAB — INFLUENZA PANEL BY PCR (TYPE A & B)
Influenza A By PCR: NEGATIVE
Influenza B By PCR: NEGATIVE

## 2018-05-27 LAB — LACTIC ACID, PLASMA: Lactic Acid, Venous: 1.4 mmol/L (ref 0.5–1.9)

## 2018-05-27 LAB — MRSA PCR SCREENING: MRSA BY PCR: NEGATIVE

## 2018-05-27 MED ORDER — SODIUM CHLORIDE 0.9 % IV SOLN
1.0000 g | INTRAVENOUS | Status: DC
  Administered 2018-05-27 – 2018-05-29 (×3): 1 g via INTRAVENOUS
  Filled 2018-05-27 (×3): qty 1

## 2018-05-27 MED ORDER — ADULT MULTIVITAMIN W/MINERALS CH
1.0000 | ORAL_TABLET | Freq: Every day | ORAL | Status: DC
  Administered 2018-05-27 – 2018-05-30 (×4): 1 via ORAL
  Filled 2018-05-27 (×4): qty 1

## 2018-05-27 MED ORDER — BOOST / RESOURCE BREEZE PO LIQD CUSTOM
1.0000 | Freq: Three times a day (TID) | ORAL | Status: DC
  Administered 2018-05-27 – 2018-05-30 (×8): 1 via ORAL

## 2018-05-27 MED ORDER — ENOXAPARIN SODIUM 30 MG/0.3ML ~~LOC~~ SOLN
30.0000 mg | SUBCUTANEOUS | Status: DC
  Administered 2018-05-28 – 2018-05-30 (×3): 30 mg via SUBCUTANEOUS
  Filled 2018-05-27 (×3): qty 0.3

## 2018-05-27 NOTE — Plan of Care (Signed)
  Problem: Education: Goal: Knowledge of General Education information will improve Description Including pain rating scale, medication(s)/side effects and non-pharmacologic comfort measures Outcome: Progressing   Problem: Health Behavior/Discharge Planning: Goal: Ability to manage health-related needs will improve Outcome: Progressing   Problem: Clinical Measurements: Goal: Ability to maintain clinical measurements within normal limits will improve Outcome: Progressing Goal: Will remain free from infection Outcome: Progressing Goal: Diagnostic test results will improve Outcome: Progressing Goal: Respiratory complications will improve Outcome: Progressing Goal: Cardiovascular complication will be avoided Outcome: Progressing   Problem: Clinical Measurements: Goal: Will remain free from infection Outcome: Progressing   Problem: Activity: Goal: Risk for activity intolerance will decrease Outcome: Progressing   Problem: Coping: Goal: Level of anxiety will decrease Outcome: Progressing   Problem: Elimination: Goal: Will not experience complications related to bowel motility Outcome: Progressing Goal: Will not experience complications related to urinary retention Outcome: Progressing   Problem: Pain Managment: Goal: General experience of comfort will improve Outcome: Progressing   Problem: Safety: Goal: Ability to remain free from injury will improve Outcome: Progressing   Problem: Skin Integrity: Goal: Risk for impaired skin integrity will decrease Outcome: Progressing

## 2018-05-27 NOTE — Progress Notes (Signed)
Pt educated on importance of ambulation and bed mobility. Declined both stating she is too weak. Will continue to encourage.

## 2018-05-27 NOTE — Progress Notes (Signed)
PROGRESS NOTE    Mackenzie Mendoza  VOZ:[id number] DOB: 10-17-1933 DOA: 05/26/2018 PCP: Lavone Orn, MD   Brief Narrative: Mackenzie Mendoza is a 84 y.o. 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 She presented secondary to cough, chest pain and fever concerning for pneumonia. Started on empiric antibiotics.   Assessment & Plan:   Principal Problem:   HCAP (healthcare-associated pneumonia) Active Problems:   Cancer of ampulla of Vater s/p Whipple 2008 [facility]   Normocytic anemia   Sepsis ([place])   GERD (gastroesophageal reflux disease)   Protein-calorie malnutrition, severe (HCC)   COPD (chronic obstructive pulmonary disease) ([place])   Anxiety   HCAP Patient has had multiple admissions for pneumonia. Most recent admission was just over one month ago for multifocal pneumonia. -Continue Vancomycin/cefepime/azithromycin -MRSA pcr -RVP -PT eval -Blood cultures pending -Transition to oral if blood cultures remain negative  Cancer of ampulla of Vater S/p Whipple.  GERD -Continue protonix  COPD Stable. No wheezing. -Continue albuterol prn  Normocytic anemia Stable.  Anxiety -Continue ativan prn  Severe malnutrition Dietician consulted. Recommending proteins supplementation and multivitamin   DVT prophylaxis: Lovenox Code Status:   Code Status: Full Code Family Communication: None at bedside Disposition Plan: Discharge pending pending improvement and transition to oral medication   Consultants:   None  Procedures:   None  Antimicrobials:  Vancomycin  Cefepime  Azithromycin    Subjective: Breathing better than yesterday.  Objective: Vitals:   05/26/18 2300 05/27/18 0000 05/27/18 0427 05/27/18 1413  BP: 128/62 118/67 (!) 97/53 (!) 116/50  Pulse: 90 90 81 85  Resp: (!) 28 (!) 24  16  Temp:  99.5 F (37.5 C) 98.9 F  (37.2 C) 100 F (37.8 C)  TempSrc:  Oral Oral Oral  SpO2: 92%  95% 95%  Weight:      Height:        Intake/Output Summary (Last 24 hours) at 05/27/2018 1458 Last data filed at 05/27/2018 1300 Gross per 24 hour  Intake 220.23 ml  Output -  Net 220.23 ml   Filed Weights   05/26/18 1906 05/26/18 2122  Weight: 20.1 kg 44.5 kg    Examination:  General exam: Appears calm and comfortable Respiratory system: Clear to auscultation. Respiratory effort normal. Cardiovascular system: S1 & S2 heard, RRR. No murmurs, rubs, gallops or clicks. Gastrointestinal system: Abdomen is nondistended, soft and nontender. No organomegaly or masses felt. Normal bowel sounds heard. Central nervous system: Alert and oriented. No focal neurological deficits. Extremities: No edema. No calf tenderness Skin: No cyanosis. No rashes Psychiatry: Judgement and insight appear normal. Mood & affect appropriate.     Data Reviewed: I have personally reviewed following labs and imaging studies  CBC: Recent Labs  Lab 05/26/18 1913  WBC 16.1*  HGB 11.2*  HCT 35.0*  MCV 94.6  PLT 425   Basic Metabolic Panel: Recent Labs  Lab 05/26/18 1913  NA 135  K 4.3  CL 99  CO2 24  GLUCOSE 149*  BUN 14  CREATININE 0.99  CALCIUM 8.9   GFR: Estimated Creatinine Clearance: 30.8 mL/min (by C-G formula based on SCr of 0.99 mg/dL). Liver Function Tests: No results for input(s): AST, ALT, ALKPHOS, BILITOT, PROT, ALBUMIN in the last 168 hours. No results for input(s): LIPASE, AMYLASE in the last 168 hours. No results for input(s): AMMONIA in the last 168 hours. Coagulation Profile: No results for input(s): INR,  PROTIME in the last 168 hours. Cardiac Enzymes: Recent Labs  Lab 05/26/18 1913  TROPONINI <0.03   BNP (last 3 results) No results for input(s): PROBNP in the last 8760 hours. HbA1C: No results for input(s): HGBA1C in the last 72 hours. CBG: No results for input(s): GLUCAP in the last 168  hours. Lipid Profile: No results for input(s): CHOL, HDL, LDLCALC, TRIG, CHOLHDL, LDLDIRECT in the last 72 hours. Thyroid Function Tests: No results for input(s): TSH, T4TOTAL, FREET4, T3FREE, THYROIDAB in the last 72 hours. Anemia Panel: No results for input(s): VITAMINB12, FOLATE, FERRITIN, TIBC, IRON, RETICCTPCT in the last 72 hours. Sepsis Labs: Recent Labs  Lab 05/26/18 1937 05/26/18 2142 05/26/18 2155 05/27/18 0037  PROCALCITON  --   --  0.35  --   LATICACIDVEN 2.47* 1.9  --  1.4    No results found for this or any previous visit (from the past 240 hour(s)).       Radiology Studies: Dg Chest 2 View  Result Date: 05/26/2018 CLINICAL DATA:  Chest pain, cough, fever EXAM: CHEST - 2 VIEW COMPARISON:  03/29/2018 chest radiograph. FINDINGS: Stable cardiomediastinal silhouette with normal heart size and moderate hiatal hernia. No pneumothorax. No pleural effusion. Hyperinflated lungs. No pulmonary edema. Mild patchy right lung base opacity, with a slightly different distribution from prior. Chronic lower thoracic/upper lumbar vertebral compression fractures status post vertebroplasty. IMPRESSION: 1. Mild patchy right lung base opacity, slightly different distribution from prior, compatible with pneumonia or atelectasis. Recommend follow-up PA and lateral post treatment chest radiographs in 4-6 weeks. 2. Hyperinflated lungs, suggesting COPD. 3. Moderate hiatal hernia. Electronically Signed   By: Ilona Sorrel M.D.   On: 05/26/2018 20:14        Scheduled Meds: . acidophilus  1 capsule Oral Daily  . azithromycin  250 mg Oral Daily  . cholecalciferol  2,000 Units Oral Once per day on Mon Wed Fri  . [START ON 05/28/2018] enoxaparin (LOVENOX) injection  30 mg Subcutaneous Q24H  . feeding supplement  1 Container Oral TID BM  . ferrous sulfate  325 mg Oral QODAY  . multivitamin with minerals  1 tablet Oral Daily  . nicotine  21 mg Transdermal Daily  . pantoprazole  40 mg Oral Daily   . vitamin B-12  100 mcg Oral Daily   Continuous Infusions: . sodium chloride 75 mL/hr at 05/27/18 0037  . ceFEPime (MAXIPIME) IV    . vancomycin       LOS: 0 days     Cordelia Poche, MD [facility] 05/27/2018, 2:58 PM  If 7PM-7AM, please contact night-coverage [email or web address]

## 2018-05-27 NOTE — Plan of Care (Signed)
  Problem: Education: Goal: Knowledge of General Education information will improve Description Including pain rating scale, medication(s)/side effects and non-pharmacologic comfort measures Outcome: Progressing   

## 2018-05-27 NOTE — Progress Notes (Signed)
Initial Nutrition Assessment  DOCUMENTATION CODES:   Severe malnutrition in context of chronic illness, Underweight  INTERVENTION:    Peach Boost Breeze po TID, each supplement provides 250 kcal and 9 grams of protein  Multivitamin daily  NUTRITION DIAGNOSIS:   Severe Malnutrition related to chronic illness((s/p Whipple)) as evidenced by severe muscle depletion, severe fat depletion.  GOAL:   Patient will meet greater than or equal to 90% of their needs  MONITOR:   PO intake, Supplement acceptance, Skin  REASON FOR ASSESSMENT:   Consult Assessment of nutrition requirement/status  ASSESSMENT:   84 yo female with PMH of collagen vascular disease, multiple myeloma, Whipple in 2007 who was admitted with HCAP.   Patient reports a lot of weight loss over the past few years. She has been eating poorly due to poor appetite. She likes peach flavored Boost Breeze supplements.    Labs reviewed. Medications reviewed and include vitamin D, Risaquad, ferrous sulfate, vitamin B-12.  NUTRITION - FOCUSED PHYSICAL EXAM:    Most Recent Value  Orbital Region  Severe depletion  Upper Arm Region  Severe depletion  Thoracic and Lumbar Region  Moderate depletion  Buccal Region  Moderate depletion  Temple Region  Severe depletion  Clavicle Bone Region  Severe depletion  Clavicle and Acromion Bone Region  Severe depletion  Scapular Bone Region  Moderate depletion  Dorsal Hand  Severe depletion  Patellar Region  Mild depletion  Anterior Thigh Region  Moderate depletion  Posterior Calf Region  Mild depletion  Edema (RD Assessment)  None  Hair  Reviewed  Eyes  Reviewed  Mouth  Reviewed  Skin  Reviewed  Nails  Reviewed       Diet Order:   Diet Order            Diet regular Room service appropriate? Yes; Fluid consistency: Thin  Diet effective now              EDUCATION NEEDS:   No education needs have been identified at this time  Skin:  Skin Assessment: Reviewed RN  Assessment  Last BM:  12/28  Height:   Ht Readings from Last 1 Encounters:  05/26/18 _0  (1.676 m)    Weight:   Wt Readings from Last 1 Encounters:  05/26/18 44.5 kg    Ideal Body Weight:  59.1 kg  BMI:  Body mass index is 15.82 kg/m.  Estimated Nutritional Needs:   Kcal:  1400-1600  Protein:  60-70 gm  Fluid:  1.5 L    Molli Barrows, RD, LDN, CNSC Pager [phone] After Hours Pager [phone]

## 2018-05-28 NOTE — Progress Notes (Signed)
PROGRESS NOTE    Mackenzie Mendoza  TFT:[id number] DOB: May 23, 1934 DOA: 05/26/2018 PCP: Lavone Orn, MD   Brief Narrative: Mackenzie Mendoza is a 84 y.o. 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 She presented secondary to cough, chest pain and fever concerning for pneumonia. Started on empiric antibiotics.   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 Most recent admission was just over one month ago for multifocal pneumonia. RVP and MRSA pcr negative. Sputum culture pending. Blood culture no growth to date. -Discontinue Vancomycin  -Continue Cefepime/azithromycin -PT recommendations: HH -Blood cultures pending -Transition to oral if blood cultures remain negative and pending sputum culture results  Cancer of ampulla of Vater S/p Whipple.  GERD -Continue protonix  COPD Stable. No wheezing. -Continue albuterol prn  Normocytic anemia Stable.  Anxiety -Continue ativan prn  Severe malnutrition Dietician consulted. Recommending proteins supplementation and multivitamin   DVT prophylaxis: Lovenox Code Status:   Code Status: Full Code Family Communication: None at bedside Disposition Plan: Discharge pending pending improvement and transition to oral medication   Consultants:   None  Procedures:   None  Antimicrobials:  Vancomycin  Cefepime  Azithromycin    Subjective: Feels about the same as yesterday.  Objective: Vitals:   05/27/18 0427 05/27/18 1413 05/28/18 0244  05/28/18 1432  BP: (!) 97/53 (!) 116/50 (!) 110/52 (!) 107/58  Pulse: 81 85 79 81  Resp:  16 16 (!) 22  Temp: 98.9 F (37.2 C) 100 F (37.8 C) 99.2 F (37.3 C) 98.4 F (36.9 C)  TempSrc: Oral Oral Oral Oral  SpO2: 95% 95% 95% 98%  Weight:      Height:        Intake/Output Summary (Last 24 hours) at 05/28/2018 1619 Last data filed at 05/28/2018 0900 Gross per 24 hour  Intake 360 ml  Output 850 ml  Net -490 ml   Filed Weights   05/26/18 1906 05/26/18 2122  Weight: 20.1 kg 44.5 kg    Examination:  General exam: Appears calm and comfortable. Thin appearing. Significant muscle wasting. Respiratory system: Clear to auscultation. Respiratory effort normal. Cardiovascular system: S1 & S2 heard, RRR. No murmurs, rubs, gallops or clicks. Gastrointestinal system: Abdomen is nondistended, soft and nontender. No organomegaly or masses felt. Normal bowel sounds heard. Central nervous system: Alert and oriented. No focal neurological deficits. Extremities: No edema. No calf tenderness Skin: No cyanosis. No rashes Psychiatry: Judgement and insight appear normal. Depressed mood. Flat affect .     Data Reviewed: I have personally reviewed following labs and imaging studies  CBC: Recent Labs  Lab 05/26/18 1913  WBC 16.1*  HGB 11.2*  HCT 35.0*  MCV 94.6  PLT 706   Basic Metabolic Panel: Recent Labs  Lab 05/26/18 1913  NA 135  K 4.3  CL 99  CO2 24  GLUCOSE 149*  BUN 14  CREATININE 0.99  CALCIUM 8.9   GFR: Estimated Creatinine Clearance: 30.8 mL/min (by C-G formula based on SCr of 0.99 mg/dL). Liver Function Tests: No results for input(s): AST, ALT, ALKPHOS, BILITOT, PROT, ALBUMIN  in the last 168 hours. No results for input(s): LIPASE, AMYLASE in the last 168 hours. No results for input(s): AMMONIA in the last 168 hours. Coagulation Profile: No results for input(s): INR, PROTIME in the last 168 hours. Cardiac Enzymes: Recent Labs  Lab 05/26/18 1913  TROPONINI  <0.03   BNP (last 3 results) No results for input(s): PROBNP in the last 8760 hours. HbA1C: No results for input(s): HGBA1C in the last 72 hours. CBG: No results for input(s): GLUCAP in the last 168 hours. Lipid Profile: No results for input(s): CHOL, HDL, LDLCALC, TRIG, CHOLHDL, LDLDIRECT in the last 72 hours. Thyroid Function Tests: No results for input(s): TSH, T4TOTAL, FREET4, T3FREE, THYROIDAB in the last 72 hours. Anemia Panel: No results for input(s): VITAMINB12, FOLATE, FERRITIN, TIBC, IRON, RETICCTPCT in the last 72 hours. Sepsis Labs: Recent Labs  Lab 05/26/18 1937 05/26/18 2142 05/26/18 2155 05/27/18 0037  PROCALCITON  --   --  0.35  --   LATICACIDVEN 2.47* 1.9  --  1.4    Recent Results (from the past 240 hour(s))  Culture, blood (routine x 2)     Status: None (Preliminary result)   Collection Time: 05/26/18  8:49 PM  Result Value Ref Range Status   Specimen Description BLOOD RIGHT FOREARM  Final   Special Requests   Final    BOTTLES DRAWN AEROBIC AND ANAEROBIC Blood Culture adequate volume   Culture   Final    NO GROWTH 2 DAYS Performed at [facility], [address], [place], [place] [address]    Report Status PENDING  Incomplete  Culture, blood (routine x 2)     Status: None (Preliminary result)   Collection Time: 05/26/18  9:55 PM  Result Value Ref Range Status   Specimen Description BLOOD RIGHT ARM  Final   Special Requests   Final    BOTTLES DRAWN AEROBIC AND ANAEROBIC Blood Culture adequate volume   Culture   Final    NO GROWTH 2 DAYS Performed at [facility], [place] [address], [place], [place] [address]    Report Status PENDING  Incomplete  Respiratory Panel by PCR     Status: None   Collection Time: 05/26/18 11:25 PM  Result Value Ref Range Status   Adenovirus NOT DETECTED NOT DETECTED Final   Coronavirus 229E NOT DETECTED NOT DETECTED Final   Coronavirus HKU1 NOT DETECTED NOT DETECTED Final   Coronavirus NL63 NOT DETECTED NOT  DETECTED Final   Coronavirus OC43 NOT DETECTED NOT DETECTED Final   Metapneumovirus NOT DETECTED NOT DETECTED Final   Rhinovirus / Enterovirus NOT DETECTED NOT DETECTED Final   Influenza A NOT DETECTED NOT DETECTED Final   Influenza B NOT DETECTED NOT DETECTED Final   Parainfluenza Virus 1 NOT DETECTED NOT DETECTED Final   Parainfluenza Virus 2 NOT DETECTED NOT DETECTED Final   Parainfluenza Virus 3 NOT DETECTED NOT DETECTED Final   Parainfluenza Virus 4 NOT DETECTED NOT DETECTED Final   Respiratory Syncytial Virus NOT DETECTED NOT DETECTED Final   Bordetella pertussis NOT DETECTED NOT DETECTED Final   Chlamydophila pneumoniae NOT DETECTED NOT DETECTED Final   Mycoplasma pneumoniae NOT DETECTED NOT DETECTED Final    Comment: Performed at [facility], [place] [address] [place], [place] [address]  MRSA PCR Screening     Status: None   Collection Time: 05/27/18  4:07 PM  Result Value Ref Range Status   MRSA by PCR NEGATIVE NEGATIVE Final    Comment:  The GeneXpert MRSA Assay (FDA approved for NASAL specimens only), is one component of a comprehensive MRSA colonization surveillance program. It is not intended to diagnose MRSA infection nor to guide or monitor treatment for MRSA infections. Performed at [facility], [place] [address] [place], [place] [address]   Culture, sputum-assessment     Status: None   Collection Time: 05/27/18  4:14 PM  Result Value Ref Range Status   Specimen Description EXPECTORATED SPUTUM  Final   Special Requests NONE  Final   Sputum evaluation   Final    THIS SPECIMEN IS ACCEPTABLE FOR SPUTUM CULTURE Performed at [facility], [address], [place], [place] [address]    Report Status 05/27/2018 FINAL  Final  Culture, respiratory     Status: None (Preliminary result)   Collection Time: 05/27/18  4:14 PM  Result Value Ref Range Status   Specimen Description EXPECTORATED SPUTUM  Final   Special Requests NONE Reflexed from [id number]   Final   Gram Stain   Final    NO WBC SEEN RARE GRAM POSITIVE COCCI RARE GRAM POSITIVE RODS    Culture   Final    TOO YOUNG TO READ Performed at [facility], [address], [place],  [address]    Report Status PENDING  Incomplete         Radiology Studies: Dg Chest 2 View  Result Date: 05/26/2018 CLINICAL DATA:  Chest pain, cough, fever EXAM: CHEST - 2 VIEW COMPARISON:  03/29/2018 chest radiograph. FINDINGS: Stable cardiomediastinal silhouette with normal heart size and moderate hiatal hernia. No pneumothorax. No pleural effusion. Hyperinflated lungs. No pulmonary edema. Mild patchy right lung base opacity, with a slightly different distribution from prior. Chronic lower thoracic/upper lumbar vertebral compression fractures status post vertebroplasty. IMPRESSION: 1. Mild patchy right lung base opacity, slightly different distribution from prior, compatible with pneumonia or atelectasis. Recommend follow-up PA and lateral post treatment chest radiographs in 4-6 weeks. 2. Hyperinflated lungs, suggesting COPD. 3. Moderate hiatal hernia. Electronically Signed   By: Ilona Sorrel M.D.   On: 05/26/2018 20:14        Scheduled Meds: . acidophilus  1 capsule Oral Daily  . azithromycin  250 mg Oral Daily  . cholecalciferol  2,000 Units Oral Once per day on Mon Wed Fri  . enoxaparin (LOVENOX) injection  30 mg Subcutaneous Q24H  . feeding supplement  1 Container Oral TID BM  . ferrous sulfate  325 mg Oral QODAY  . multivitamin with minerals  1 tablet Oral Daily  . nicotine  21 mg Transdermal Daily  . pantoprazole  40 mg Oral Daily  . vitamin B-12  100 mcg Oral Daily   Continuous Infusions: . sodium chloride 75 mL/hr at 05/28/18 1057  . ceFEPime (MAXIPIME) IV 1 g (05/27/18 2125)     LOS: 1 day     Cordelia Poche, MD [facility] 05/28/2018, 4:19 PM  If 7PM-7AM, please contact night-coverage [email or web address]

## 2018-05-28 NOTE — Plan of Care (Signed)

## 2018-05-28 NOTE — Evaluation (Signed)
Physical Therapy Evaluation Patient Details Name: Mackenzie Mendoza MRN: [id number] DOB: 11/08/1933 Today's Date: 05/28/2018   History of Present Illness  Mackenzie Mendoza is a 84 y.o. female with medical history significant of  cancer of the ampulla of Vater (s/p of Whipple procedure), GERD, iron deficiency anemia, tobacco abuse, chronic abdominal pain. She presented secondary to cough, chest pain and fever concerning for pneumonia  Clinical Impression   Pt admitted with above diagnosis. Pt currently with functional limitations due to the deficits listed below (see PT Problem List). Reports she is independent, and walking without assistive device at baseline; Presents to PT with decr activity tolerance, generalized weakness; Needing lots of encouragement to participate in physical activity;  Pt will benefit from skilled PT to increase their independence and safety with mobility to allow discharge to the venue listed below.       Follow Up Recommendations Home health PT;Supervision - Intermittent    Equipment Recommendations  Rolling walker with 5" wheels;3in1 (PT);Other (comment)(Will likely use her daughter's RW)    Recommendations for Other Services OT consult(Will order per protocol)     Precautions / Restrictions Precautions Precautions: Fall      Mobility  Bed Mobility Overal bed mobility: Needs Assistance Bed Mobility: Supine to Sit     Supine to sit: Min guard     General bed mobility comments: Minguard for safety; used rails, no physical assist needed  Transfers Overall transfer level: Needs assistance Equipment used: None Transfers: Stand Pivot Transfers   Stand pivot transfers: Min guard       General transfer comment: Agreed to stand pivot transfer OOB to recliner (for lunch) only; declined use of RW for support; close guard for safety and noted signifcant dependence on UE support during transfer  Ambulation/Gait             General Gait Details:  Declined amb today  Stairs            Wheelchair Mobility    Modified Rankin (Stroke Patients Only)       Balance Overall balance assessment: Needs assistance   Sitting balance-Leahy Scale: Fair         Standing balance comment: Will need to see more time and activity in standing to better assess standing balance                             Pertinent Vitals/Pain Pain Assessment: No/denies pain    Home Living Family/patient expects to be discharged to:: Private residence Living Arrangements: Spouse/significant other;Children Available Help at Discharge: Family Type of Home: House Home Access: Stairs to enter Entrance Stairs-Rails: Right(unsure which side) Technical brewer of Steps: 2 Home Layout: One level Home Equipment: Environmental consultant - 2 wheels(her daughter's RW)      Prior Function Level of Independence: Independent         Comments: 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 Luan Pulling, her daugher doesn't use the RW anymore     Hand Dominance        Extremity/Trunk Assessment   Upper Extremity Assessment Upper Extremity Assessment: Generalized weakness    Lower Extremity Assessment Lower Extremity Assessment: Generalized weakness       Communication   Communication: No difficulties  Cognition Arousal/Alertness: Awake/alert Behavior During Therapy: WFL for tasks assessed/performed;Flat affect Overall Cognitive Status: Within Functional Limits for tasks assessed  General Comments: Needed encouragement to participate      General Comments      Exercises     Assessment/Plan    PT Assessment Patient needs continued PT services  PT Problem List Decreased strength;Decreased activity tolerance;Decreased balance;Decreased mobility;Decreased knowledge of use of DME;Decreased safety awareness;Cardiopulmonary  status limiting activity       PT Treatment Interventions DME instruction;Gait training;Stair training;Functional mobility training;Therapeutic activities;Therapeutic exercise;Balance training;Patient/family education    PT Goals (Current goals can be found in the Care Plan section)  Acute Rehab PT Goals Patient Stated Goal: Did not state PT Goal Formulation: With patient Time For Goal Achievement: 06/11/18 Potential to Achieve Goals: Good    Frequency Min 3X/week   Barriers to discharge Other (comment) I am concerned that she is the caregiver for her husband and daughter    Co-evaluation               AM-PAC PT "6 Clicks" Mobility  Outcome Measure Help needed turning from your back to your side while in a flat bed without using bedrails?: None Help needed moving from lying on your back to sitting on the side of a flat bed without using bedrails?: A Little Help needed moving to and from a bed to a chair (including a wheelchair)?: A Little Help needed standing up from a chair using your arms (e.g., wheelchair or bedside chair)?: A Little Help needed to walk in hospital room?: A Little Help needed climbing 3-5 steps with a railing? : A Little 6 Click Score: 19    End of Session Equipment Utilized During Treatment: Gait belt Activity Tolerance: Patient tolerated treatment well Patient left: in chair;with call bell/phone within reach;Other (comment)(eating lunch) Nurse Communication: Mobility status PT Visit Diagnosis: Unsteadiness on feet (R26.81);Other abnormalities of gait and mobility (R26.89);Muscle weakness (generalized) (M62.81)    Time: 8329-1916 PT Time Calculation (min) (ACUTE ONLY): 14 min   Charges:   PT Evaluation $PT Eval Moderate Complexity: 1 Mod          Mackenzie Mendoza, [place]  Acute Rehabilitation Services Pager [phone] Office [phone]   Mackenzie Mendoza 05/28/2018, 1:51 PM

## 2018-05-29 NOTE — Progress Notes (Signed)
PROGRESS NOTE    Mackenzie Mendoza  RCB:[id number] DOB: 01/08/1934 DOA: 05/26/2018 PCP: Lavone Orn, MD   Brief Narrative: Mackenzie Mendoza is a 85 y.o. 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 She presented secondary to cough, chest pain and fever concerning for pneumonia. Started on empiric antibiotics.   Assessment & Plan:   Principal Problem:   HCAP (healthcare-associated pneumonia) Active Problems:   Cancer of ampulla of Vater s/p Whipple 2008 [facility]   Normocytic anemia   Sepsis ([place])   GERD (gastroesophageal reflux disease)   Protein-calorie malnutrition, severe ([facility])   COPD (chronic obstructive pulmonary disease) ([place])   Anxiety   HCAP Patient has had multiple admissions for pneumonia. Most recent admission was just over one month ago for multifocal pneumonia. RVP and MRSA pcr negative. Sputum culture pending. Blood culture no growth to date. Swallow study/MBS not significant for aspiration. -Continue Cefepime/azithromycin -PT recommendations: HH -Blood cultures pending -Transition to oral if blood cultures remain negative and pending sputum culture results (still pending)  Cancer of ampulla of Vater S/p Whipple.  GERD -Continue protonix  COPD Stable. No wheezing. -Continue albuterol prn  Normocytic anemia Stable.  Anxiety -Continue ativan prn  Severe malnutrition Dietician consulted. Recommending proteins supplementation and multivitamin   DVT prophylaxis: Lovenox Code Status:   Code Status: Full Code Family Communication: None at bedside Disposition Plan: Discharge pending pending improvement and transition to oral medication   Consultants:   None  Procedures:   None  Antimicrobials:  Vancomycin  Cefepime  Azithromycin    Subjective: No concerns today. No chest pain or dyspnea.  Objective: Vitals:    05/28/18 0244 05/28/18 1432 05/28/18 2017 05/29/18 0436  BP: (!) 110/52 (!) 107/58 118/62 121/61  Pulse: 79 81 82 75  Resp: 16 (!) 22 18 16   Temp: 99.2 F (37.3 C) 98.4 F (36.9 C) 98.6 F (37 C) 98.5 F (36.9 C)  TempSrc: Oral Oral Oral Oral  SpO2: 95% 98% 96% 96%  Weight:      Height:        Intake/Output Summary (Last 24 hours) at 05/29/2018 1346 Last data filed at 05/29/2018 0238 Gross per 24 hour  Intake 1185.37 ml  Output 2300 ml  Net -1114.63 ml   Filed Weights   05/26/18 1906 05/26/18 2122  Weight: 20.1 kg 44.5 kg    Examination:  General exam: Appears calm and comfortable Respiratory system: Clear to auscultation. Respiratory effort normal. Cardiovascular system: S1 & S2 heard, RRR. No murmurs, rubs, gallops or clicks. Gastrointestinal system: Abdomen is nondistended, soft and nontender. No organomegaly or masses felt. Normal bowel sounds heard. Central nervous system: Alert and oriented. No focal neurological deficits. Extremities: No edema. No calf tenderness Skin: No cyanosis. No rashes Psychiatry: Judgement and insight appear normal. Flat affect    Data Reviewed: I have personally reviewed following labs and imaging studies  CBC: Recent Labs  Lab 05/26/18 1913  WBC 16.1*  HGB 11.2*  HCT 35.0*  MCV 94.6  PLT 803   Basic Metabolic Panel: Recent Labs  Lab 05/26/18 1913  NA 135  K 4.3  CL 99  CO2 24  GLUCOSE 149*  BUN 14  CREATININE 0.99  CALCIUM 8.9   GFR: Estimated Creatinine Clearance: 30.8 mL/min (by C-G formula based on SCr of 0.99 mg/dL). Liver Function Tests: No results for input(s): AST, ALT, ALKPHOS, BILITOT, PROT, ALBUMIN in the last 168  hours. No results for input(s): LIPASE, AMYLASE in the last 168 hours. No results for input(s): AMMONIA in the last 168 hours. Coagulation Profile: No results for input(s): INR, PROTIME in the last 168 hours. Cardiac Enzymes: Recent Labs  Lab 05/26/18 1913  TROPONINI <0.03   BNP (last 3  results) No results for input(s): PROBNP in the last 8760 hours. HbA1C: No results for input(s): HGBA1C in the last 72 hours. CBG: No results for input(s): GLUCAP in the last 168 hours. Lipid Profile: No results for input(s): CHOL, HDL, LDLCALC, TRIG, CHOLHDL, LDLDIRECT in the last 72 hours. Thyroid Function Tests: No results for input(s): TSH, T4TOTAL, FREET4, T3FREE, THYROIDAB in the last 72 hours. Anemia Panel: No results for input(s): VITAMINB12, FOLATE, FERRITIN, TIBC, IRON, RETICCTPCT in the last 72 hours. Sepsis Labs: Recent Labs  Lab 05/26/18 1937 05/26/18 2142 05/26/18 2155 05/27/18 0037  PROCALCITON  --   --  0.35  --   LATICACIDVEN 2.47* 1.9  --  1.4    Recent Results (from the past 240 hour(s))  Culture, blood (routine x 2)     Status: None (Preliminary result)   Collection Time: 05/26/18  8:49 PM  Result Value Ref Range Status   Specimen Description BLOOD RIGHT FOREARM  Final   Special Requests   Final    BOTTLES DRAWN AEROBIC AND ANAEROBIC Blood Culture adequate volume   Culture   Final    NO GROWTH 2 DAYS Performed at [facility], [address], [place], [place] [address]    Report Status PENDING  Incomplete  Culture, blood (routine x 2)     Status: None (Preliminary result)   Collection Time: 05/26/18  9:55 PM  Result Value Ref Range Status   Specimen Description BLOOD RIGHT ARM  Final   Special Requests   Final    BOTTLES DRAWN AEROBIC AND ANAEROBIC Blood Culture adequate volume   Culture   Final    NO GROWTH 2 DAYS Performed at [facility], [place] [address], [place], [place] [address]    Report Status PENDING  Incomplete  Respiratory Panel by PCR     Status: None   Collection Time: 05/26/18 11:25 PM  Result Value Ref Range Status   Adenovirus NOT DETECTED NOT DETECTED Final   Coronavirus 229E NOT DETECTED NOT DETECTED Final   Coronavirus HKU1 NOT DETECTED NOT DETECTED Final   Coronavirus NL63 NOT DETECTED NOT DETECTED Final    Coronavirus OC43 NOT DETECTED NOT DETECTED Final   Metapneumovirus NOT DETECTED NOT DETECTED Final   Rhinovirus / Enterovirus NOT DETECTED NOT DETECTED Final   Influenza A NOT DETECTED NOT DETECTED Final   Influenza B NOT DETECTED NOT DETECTED Final   Parainfluenza Virus 1 NOT DETECTED NOT DETECTED Final   Parainfluenza Virus 2 NOT DETECTED NOT DETECTED Final   Parainfluenza Virus 3 NOT DETECTED NOT DETECTED Final   Parainfluenza Virus 4 NOT DETECTED NOT DETECTED Final   Respiratory Syncytial Virus NOT DETECTED NOT DETECTED Final   Bordetella pertussis NOT DETECTED NOT DETECTED Final   Chlamydophila pneumoniae NOT DETECTED NOT DETECTED Final   Mycoplasma pneumoniae NOT DETECTED NOT DETECTED Final    Comment: Performed at [facility], [place] [address], [place], [place] [address]  MRSA PCR Screening     Status: None   Collection Time: 05/27/18  4:07 PM  Result Value Ref Range Status   MRSA by PCR NEGATIVE NEGATIVE Final    Comment:  The GeneXpert MRSA Assay (FDA approved for NASAL specimens only), is one component of a comprehensive MRSA colonization surveillance program. It is not intended to diagnose MRSA infection nor to guide or monitor treatment for MRSA infections. Performed at [facility], [place] [address], [place], [place] [address]   Culture, sputum-assessment     Status: None   Collection Time: 05/27/18  4:14 PM  Result Value Ref Range Status   Specimen Description EXPECTORATED SPUTUM  Final   Special Requests NONE  Final   Sputum evaluation   Final    THIS SPECIMEN IS ACCEPTABLE FOR SPUTUM CULTURE Performed at [facility], [address], [place], [place] [address]    Report Status 05/27/2018 FINAL  Final  Culture, respiratory     Status: None (Preliminary result)   Collection Time: 05/27/18  4:14 PM  Result Value Ref Range Status   Specimen Description EXPECTORATED SPUTUM  Final   Special Requests NONE Reflexed from [id number]  Final   Gram  Stain   Final    NO WBC SEEN RARE GRAM POSITIVE COCCI RARE GRAM POSITIVE RODS    Culture   Final    CULTURE REINCUBATED FOR BETTER GROWTH Performed at [facility], [place] [address], [place], [place] [address]    Report Status PENDING  Incomplete         Radiology Studies: No results found.      Scheduled Meds: . acidophilus  1 capsule Oral Daily  . azithromycin  250 mg Oral Daily  . cholecalciferol  2,000 Units Oral Once per day on Mon Wed Fri  . enoxaparin (LOVENOX) injection  30 mg Subcutaneous Q24H  . feeding supplement  1 Container Oral TID BM  . ferrous sulfate  325 mg Oral QODAY  . multivitamin with minerals  1 tablet Oral Daily  . nicotine  21 mg Transdermal Daily  . pantoprazole  40 mg Oral Daily  . vitamin B-12  100 mcg Oral Daily   Continuous Infusions: . sodium chloride 75 mL/hr at 05/29/18 1208  . ceFEPime (MAXIPIME) IV 1 g (05/28/18 2249)     LOS: 2 days     Cordelia Poche, MD [facility] 05/29/2018, 1:46 PM  If 7PM-7AM, please contact night-coverage [email or web address]

## 2018-05-29 NOTE — Plan of Care (Signed)
  Problem: Education: Goal: Knowledge of General Education information will improve Description: Including pain rating scale, medication(s)/side effects and non-pharmacologic comfort measures Outcome: Progressing   Problem: Clinical Measurements: Goal: Ability to maintain clinical measurements within normal limits will improve Outcome: Progressing   Problem: Clinical Measurements: Goal: Respiratory complications will improve Outcome: Progressing   Problem: Activity: Goal: Risk for activity intolerance will decrease Outcome: Progressing   Problem: Safety: Goal: Ability to remain free from injury will improve Outcome: Progressing   

## 2018-05-30 LAB — CULTURE, RESPIRATORY W GRAM STAIN
Culture: NORMAL
Gram Stain: NONE SEEN

## 2018-05-30 MED ORDER — CEPHALEXIN 500 MG PO CAPS: 500.0000 mg | ORAL_CAPSULE | Freq: Four times a day (QID) | ORAL | 0 refills | Status: AC

## 2018-05-30 MED ORDER — AZITHROMYCIN 500 MG PO TABS: 500.0000 mg | ORAL_TABLET | Freq: Every day | ORAL | 0 refills | Status: AC

## 2018-05-30 NOTE — Care Management Important Message (Signed)
Important Message  Patient Details  Name: SOLINA HERON MRN: [id number] Date of Birth: 1934/03/11   Medicare Important Message Given:  Yes    Orbie Pyo 05/30/2018, 3:04 PM

## 2018-05-30 NOTE — Discharge Summary (Signed)
Physician Discharge Summary  Mackenzie Mendoza ZYS:[id number] DOB: 1934/01/20 DOA: 05/26/2018  PCP: Lavone Orn, MD  Admit date: 05/26/2018 Discharge date: 05/30/2018  Admitted From: Home Disposition:  Home   Recommendations for Outpatient Follow-up:  1. Follow up with PCP in 1 week 2. Please obtain CBC in 1 week to ensure resolution of leukocytosis  3. Repeat CXR in 4 to 6 weeks to ensure resolution of pneumonia 4. Please follow up on the following pending results: final blood culture results, negative at day of discharge   Home Health: PT   Equipment/Devices: Rolling walker, 3in1   Discharge Condition: Stable CODE STATUS: Full  Diet recommendation: Heart healthy   Brief/Interim Summary: HPI by Dr. Blaine Hamper: Mackenzie Mendoza is a 85 y.o. 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 Pt states that her symptoms started yesterday, including fever, cough and chest pain.  She has dry cough, with mild shortness of breath.  Her chest pain is located in the left side of the chest, constant, moderate, nonradiating, pleuritic, aggravated by coughing.  She had fever of 100 at home. Patient has chronic mild abdominal pain.  She also has nausea, but no vomiting or diarrhea.  Denies symptoms of UTI or unilateral weakness.  No choking on eating or drinking.  Of note, patient was recently hospitalized from 11/1-11/3 due to multifocal pneumonia.  She has been doing okay until yesterday.  ED Course: pt was found to have WBC 16.1, lactic acid 2.47, electrolytes renal function okay, temperature normal, no tachycardia, has tachypnea, oxygen saturation 96% on room air.  Chest x-ray with right basilar infiltration and COPD.  Patient is placed on MedSurg bed for observation.  Interim: She continued to improve with IV antibiotics.  Cultures  were negative.  She was evaluated by PT who recommended home health therapies.  She is sent home in stable condition, to finish oral antibiotics at time of discharge.  Discharge Diagnoses:  Principal Problem:   HCAP (healthcare-associated pneumonia) Active Problems:   Cancer of ampulla of Vater s/p Whipple 2008 [facility]   Normocytic anemia   Sepsis ([place])   GERD (gastroesophageal reflux disease)   Protein-calorie malnutrition, severe (HCC)   COPD (chronic obstructive pulmonary disease) ([place])   Anxiety   HCAP Patient has had multiple admissions for pneumonia. Most recent admission was just over one month ago for multifocal pneumonia. RVP and MRSA pcr negative. Sputum culture with normal respiratory flora. Blood culture no growth to date. Swallow study/MBS not significant for aspiration. -Continue Cefepime/azithromycin --> Keflex/Azithromax on discharge  -Blood cultures negative to date   Cancer of ampulla of Vater S/p Whipple.  GERD Continue protonix  COPD Stable. No wheezing. Continue albuterol prn  Normocytic anemia Stable.  Anxiety Continue ativan prn  Severe malnutrition Dietician consulted. Recommending proteins supplementation and multivitamin   Discharge Instructions  Discharge Instructions    Call MD for:  difficulty breathing, headache or visual disturbances   Complete by:  As directed    Call MD for:  extreme fatigue   Complete by:  As directed    Call MD for:  hives   Complete by:  As directed    Call MD for:  persistant dizziness or light-headedness   Complete by:  As directed    Call MD for:  persistant nausea and vomiting   Complete by:  As directed  Call MD for:  severe uncontrolled pain   Complete by:  As directed    Call MD for:  temperature >100.4   Complete by:  As directed    Diet - low sodium heart healthy   Complete by:  As directed    Discharge instructions   Complete by:  As directed    You were cared for by a hospitalist during  your hospital stay. If you have any questions about your discharge medications or the care you received while you were in the hospital after you are discharged, you can call the unit and ask to speak with the hospitalist on call if the hospitalist that took care of you is not available. Once you are discharged, your primary care physician will handle any further medical issues. Please note that NO REFILLS for any discharge medications will be authorized once you are discharged, as it is imperative that you return to your primary care physician (or establish a relationship with a primary care physician if you do not have one) for your aftercare needs so that they can reassess your need for medications and monitor your lab values.   Increase activity slowly   Complete by:  As directed      Allergies as of 05/30/2018      Reactions   Adhesive [tape] Other (See Comments)   TAPE TEARS THE SKIN VERY EASILY (it's thin!!)   Aspirin Hives   Codeine-promethazine [promethazine-codeine] Nausea And Vomiting   Estrogens Hives   Phenobarbital Itching      Medication List    TAKE these medications   acetaminophen 500 MG tablet Commonly known as:  TYLENOL Take 1 tablet (500 mg total) by mouth every 6 (six) hours as needed for moderate pain or headache. What changed:  how much to take   albuterol 108 (90 Base) MCG/ACT inhaler Commonly known as:  PROVENTIL HFA;VENTOLIN HFA Inhale 1-2 puffs into the lungs every 6 (six) hours as needed for wheezing or shortness of breath.   azithromycin 500 MG tablet Commonly known as:  ZITHROMAX Take 1 tablet (500 mg total) by mouth daily for 3 days. Take 1 tablet daily for 3 days.   cephALEXin 500 MG capsule Commonly known as:  KEFLEX Take 1 capsule (500 mg total) by mouth 4 (four) times daily for 3 days.   cholecalciferol 1000 units tablet Commonly known as:  VITAMIN D Take 2,000 Units by mouth 3 (three) times a week.   dextromethorphan-guaiFENesin 30-600 MG 12hr  tablet Commonly known as:  MUCINEX DM Take 1 tablet by mouth 2 (two) times daily.   feeding supplement Liqd Take 1 Container by mouth 3 (three) times daily between meals.   ferrous sulfate 325 (65 FE) MG tablet Take 325 mg by mouth every other day.   LORazepam 1 MG tablet Commonly known as:  ATIVAN Take 0.5-1 mg by mouth 2 (two) times daily as needed for anxiety.   pantoprazole 40 MG tablet Commonly known as:  PROTONIX Take 1 tablet (40 mg total) by mouth 2 (two) times daily before a meal. What changed:  when to take this   PROBIOTIC PO Take 1 capsule by mouth daily.   VITAMIN B-12 PO Take 1 tablet by mouth daily.            Durable Medical Equipment  (From admission, onward)         Start     Ordered   05/30/18 0959  For home use only DME Walker rolling  (  Walkers)  Once    Question:  Patient needs a walker to treat with the following condition  Answer:  Weakness   05/30/18 0959   05/30/18 0959  DME 3-in-1  Once     05/30/18 6712         Follow-up Information    Lavone Orn, MD. Schedule an appointment as soon as possible for a visit in 1 week(s).   Specialty:  Internal Medicine Contact information: [address], [address] [place] [address] [place] Follow up.   Why:  rolling walker and bedside commode will be delivered prior to discharge Contact information: [place] [address] [phone]        [facility] Follow up.   Specialty:  [place] Why:  home health PT arranged, start of care to begin with 24-48 hours Contact information: [place] [address] [phone]          Allergies  Allergen Reactions  . Adhesive [Tape] Other (See Comments)    TAPE TEARS THE SKIN VERY EASILY (it's thin!!)  . Aspirin Hives  . Codeine-Promethazine [Promethazine-Codeine] Nausea And Vomiting  . Estrogens Hives  .  Phenobarbital Itching    Consultations:  None   Procedures/Studies: Dg Chest 2 View  Result Date: 05/26/2018 CLINICAL DATA:  Chest pain, cough, fever EXAM: CHEST - 2 VIEW COMPARISON:  03/29/2018 chest radiograph. FINDINGS: Stable cardiomediastinal silhouette with normal heart size and moderate hiatal hernia. No pneumothorax. No pleural effusion. Hyperinflated lungs. No pulmonary edema. Mild patchy right lung base opacity, with a slightly different distribution from prior. Chronic lower thoracic/upper lumbar vertebral compression fractures status post vertebroplasty. IMPRESSION: 1. Mild patchy right lung base opacity, slightly different distribution from prior, compatible with pneumonia or atelectasis. Recommend follow-up PA and lateral post treatment chest radiographs in 4-6 weeks. 2. Hyperinflated lungs, suggesting COPD. 3. Moderate hiatal hernia. Electronically Signed   By: Ilona Sorrel M.D.   On: 05/26/2018 20:14      Discharge Exam: Vitals:   05/29/18 2048 05/30/18 0503  BP: (!) 110/55 (!) 113/59  Pulse: 89   Resp: 20   Temp: 98.2 F (36.8 C)   SpO2: 97%     General: Pt is alert, awake, not in acute distress Cardiovascular: RRR, S1/S2 +, no rubs, no gallops Respiratory: CTA bilaterally, no wheezing, no rhonchi Abdominal: Soft, NT, ND, bowel sounds + Extremities: no edema, no cyanosis    The results of significant diagnostics from this hospitalization (including imaging, microbiology, ancillary and laboratory) are listed below for reference.     Microbiology: Recent Results (from the past 240 hour(s))  Culture, blood (routine x 2)     Status: None (Preliminary result)   Collection Time: 05/26/18  8:49 PM  Result Value Ref Range Status   Specimen Description BLOOD RIGHT FOREARM  Final   Special Requests   Final    BOTTLES DRAWN AEROBIC AND ANAEROBIC Blood Culture adequate volume   Culture   Final    NO GROWTH 4 DAYS Performed at [facility], [address], [place], [place] [address]    Report Status PENDING  Incomplete  Culture, blood (routine x 2)     Status: None (Preliminary result)   Collection Time: 05/26/18  9:55 PM  Result Value Ref Range Status   Specimen Description BLOOD RIGHT ARM  Final   Special Requests  Final    BOTTLES DRAWN AEROBIC AND ANAEROBIC Blood Culture adequate volume   Culture   Final    NO GROWTH 4 DAYS Performed at [facility], [place] [address] [place], [place] [address]    Report Status PENDING  Incomplete  Respiratory Panel by PCR     Status: None   Collection Time: 05/26/18 11:25 PM  Result Value Ref Range Status   Adenovirus NOT DETECTED NOT DETECTED Final   Coronavirus 229E NOT DETECTED NOT DETECTED Final   Coronavirus HKU1 NOT DETECTED NOT DETECTED Final   Coronavirus NL63 NOT DETECTED NOT DETECTED Final   Coronavirus OC43 NOT DETECTED NOT DETECTED Final   Metapneumovirus NOT DETECTED NOT DETECTED Final   Rhinovirus / Enterovirus NOT DETECTED NOT DETECTED Final   Influenza A NOT DETECTED NOT DETECTED Final   Influenza B NOT DETECTED NOT DETECTED Final   Parainfluenza Virus 1 NOT DETECTED NOT DETECTED Final   Parainfluenza Virus 2 NOT DETECTED NOT DETECTED Final   Parainfluenza Virus 3 NOT DETECTED NOT DETECTED Final   Parainfluenza Virus 4 NOT DETECTED NOT DETECTED Final   Respiratory Syncytial Virus NOT DETECTED NOT DETECTED Final   Bordetella pertussis NOT DETECTED NOT DETECTED Final   Chlamydophila pneumoniae NOT DETECTED NOT DETECTED Final   Mycoplasma pneumoniae NOT DETECTED NOT DETECTED Final    Comment: Performed at [facility], [place] [address], [place], [place] [address]  MRSA PCR Screening     Status: None   Collection Time: 05/27/18  4:07 PM  Result Value Ref Range Status   MRSA by PCR NEGATIVE NEGATIVE Final    Comment:        The GeneXpert MRSA Assay (FDA approved for NASAL specimens only), is one component of a comprehensive MRSA colonization surveillance program. It  is not intended to diagnose MRSA infection nor to guide or monitor treatment for MRSA infections. Performed at [facility], [place] [address], [place], [place] [address]   Culture, sputum-assessment     Status: None   Collection Time: 05/27/18  4:14 PM  Result Value Ref Range Status   Specimen Description EXPECTORATED SPUTUM  Final   Special Requests NONE  Final   Sputum evaluation   Final    THIS SPECIMEN IS ACCEPTABLE FOR SPUTUM CULTURE Performed at [facility], [address] [place], [place] [address]    Report Status 05/27/2018 FINAL  Final  Culture, respiratory     Status: None   Collection Time: 05/27/18  4:14 PM  Result Value Ref Range Status   Specimen Description EXPECTORATED SPUTUM  Final   Special Requests NONE Reflexed from [id number]  Final   Gram Stain   Final    NO WBC SEEN RARE GRAM POSITIVE COCCI RARE GRAM POSITIVE RODS    Culture   Final    FEW Consistent with normal respiratory flora. Performed at [facility], [place] [address], [place], [place] [address]    Report Status 05/30/2018 FINAL  Final     Labs: BNP (last 3 results) Recent Labs    10/13/17 1305  BNP 025.8*   Basic Metabolic Panel: Recent Labs  Lab 05/26/18 1913  NA 135  K 4.3  CL 99  CO2 24  GLUCOSE 149*  BUN 14  CREATININE 0.99  CALCIUM 8.9   Liver Function Tests: No results for input(s): AST, ALT, ALKPHOS, BILITOT, PROT, ALBUMIN in the last 168 hours. No results for input(s): LIPASE, AMYLASE in the last  168 hours. No results for input(s): AMMONIA in the last 168 hours. CBC: Recent Labs  Lab 05/26/18 1913  WBC 16.1*  HGB 11.2*  HCT 35.0*  MCV 94.6  PLT 171   Cardiac Enzymes: Recent Labs  Lab 05/26/18 1913  TROPONINI <0.03   BNP: Invalid input(s): POCBNP CBG: No results for input(s): GLUCAP in the last 168 hours. D-Dimer No results for input(s): DDIMER in the last 72 hours. Hgb A1c No results for input(s): HGBA1C in the last 72 hours. Lipid  Profile No results for input(s): CHOL, HDL, LDLCALC, TRIG, CHOLHDL, LDLDIRECT in the last 72 hours. Thyroid function studies No results for input(s): TSH, T4TOTAL, T3FREE, THYROIDAB in the last 72 hours.  Invalid input(s): FREET3 Anemia work up No results for input(s): VITAMINB12, FOLATE, FERRITIN, TIBC, IRON, RETICCTPCT in the last 72 hours. Urinalysis    Component Value Date/Time   COLORURINE YELLOW 03/29/2018 [place] 03/29/2018 1047   LABSPEC 1.005 03/29/2018 1047   PHURINE 7.0 03/29/2018 1047   GLUCOSEU NEGATIVE 03/29/2018 1047   HGBUR SMALL (A) 03/29/2018 1047   BILIRUBINUR NEGATIVE 03/29/2018 1047   KETONESUR 5 (A) 03/29/2018 1047   PROTEINUR NEGATIVE 03/29/2018 1047   UROBILINOGEN 1.0 08/11/2014 2239   NITRITE NEGATIVE 03/29/2018 1047   LEUKOCYTESUR NEGATIVE 03/29/2018 1047   Sepsis Labs Invalid input(s): PROCALCITONIN,  WBC,  LACTICIDVEN Microbiology Recent Results (from the past 240 hour(s))  Culture, blood (routine x 2)     Status: None (Preliminary result)   Collection Time: 05/26/18  8:49 PM  Result Value Ref Range Status   Specimen Description BLOOD RIGHT FOREARM  Final   Special Requests   Final    BOTTLES DRAWN AEROBIC AND ANAEROBIC Blood Culture adequate volume   Culture   Final    NO GROWTH 4 DAYS Performed at [facility], [place] [address] [place], [place] [address]    Report Status PENDING  Incomplete  Culture, blood (routine x 2)     Status: None (Preliminary result)   Collection Time: 05/26/18  9:55 PM  Result Value Ref Range Status   Specimen Description BLOOD RIGHT ARM  Final   Special Requests   Final    BOTTLES DRAWN AEROBIC AND ANAEROBIC Blood Culture adequate volume   Culture   Final    NO GROWTH 4 DAYS Performed at [facility], [address], [place], [place] [address]    Report Status PENDING  Incomplete  Respiratory Panel by PCR     Status: None   Collection Time: 05/26/18 11:25 PM  Result Value Ref Range  Status   Adenovirus NOT DETECTED NOT DETECTED Final   Coronavirus 229E NOT DETECTED NOT DETECTED Final   Coronavirus HKU1 NOT DETECTED NOT DETECTED Final   Coronavirus NL63 NOT DETECTED NOT DETECTED Final   Coronavirus OC43 NOT DETECTED NOT DETECTED Final   Metapneumovirus NOT DETECTED NOT DETECTED Final   Rhinovirus / Enterovirus NOT DETECTED NOT DETECTED Final   Influenza A NOT DETECTED NOT DETECTED Final   Influenza B NOT DETECTED NOT DETECTED Final   Parainfluenza Virus 1 NOT DETECTED NOT DETECTED Final   Parainfluenza Virus 2 NOT DETECTED NOT DETECTED Final   Parainfluenza Virus 3 NOT DETECTED NOT DETECTED Final   Parainfluenza Virus 4 NOT DETECTED NOT DETECTED Final   Respiratory Syncytial Virus NOT DETECTED NOT DETECTED Final   Bordetella pertussis NOT DETECTED NOT DETECTED Final   Chlamydophila pneumoniae NOT DETECTED NOT DETECTED Final   Mycoplasma pneumoniae  NOT DETECTED NOT DETECTED Final    Comment: Performed at [facility], [place] [address], [place], [place] [address]  MRSA PCR Screening     Status: None   Collection Time: 05/27/18  4:07 PM  Result Value Ref Range Status   MRSA by PCR NEGATIVE NEGATIVE Final    Comment:        The GeneXpert MRSA Assay (FDA approved for NASAL specimens only), is one component of a comprehensive MRSA colonization surveillance program. It is not intended to diagnose MRSA infection nor to guide or monitor treatment for MRSA infections. Performed at [facility], [place] [address] [place], [place] [address]   Culture, sputum-assessment     Status: None   Collection Time: 05/27/18  4:14 PM  Result Value Ref Range Status   Specimen Description EXPECTORATED SPUTUM  Final   Special Requests NONE  Final   Sputum evaluation   Final    THIS SPECIMEN IS ACCEPTABLE FOR SPUTUM CULTURE Performed at [facility], [address] [place], [place] [address]    Report Status 05/27/2018 FINAL  Final  Culture, respiratory      Status: None   Collection Time: 05/27/18  4:14 PM  Result Value Ref Range Status   Specimen Description EXPECTORATED SPUTUM  Final   Special Requests NONE Reflexed from [id number]  Final   Gram Stain   Final    NO WBC SEEN RARE GRAM POSITIVE COCCI RARE GRAM POSITIVE RODS    Culture   Final    FEW Consistent with normal respiratory flora. Performed at [facility], [place] [address] [place],  [address]    Report Status 05/30/2018 FINAL  Final     Patient was seen and examined on the day of discharge and was found to be in stable condition. Time coordinating discharge: 35 minutes including assessment and coordination of care, as well as examination of the patient.   SIGNED:  Dessa Phi, DO [facility] Pager [phone]  If 7PM-7AM, please contact night-coverage [email or web address] Password [facility] 05/30/2018, 3:40 PM

## 2018-05-30 NOTE — Care Management Note (Signed)
Case Management Note  Patient Details  Name: NAIOMY WATTERS MRN: [id number] Date of Birth: 01-18-1934  Subjective/Objective:       Admitted with HCAP. Hx of cancer of the ampulla of Vater (s/p of Whipple procedure), GERD, iron deficiency anemia, tobacco abuse, chronic abdominal pain.    Trevor Iha ([address]) Lewanna Petrak (Spouse)     [phone]     PCP: Lavone Orn  Action/Plan: Transition to home with home health services to follow. [facility] to provide home health services. DME ( rolling walker and 3in1) will be delivered to bedside prior to d/c.  Expected Discharge Date:  05/30/18               Expected Discharge Plan:  [place]  In-House Referral:     Discharge planning Services  CM Consult  Post Acute Care Choice:    Choice offered to:  Patient  DME Arranged:  3-N-1, Walker rolling DME Agency:  [place]., pt states has   HH Arranged:  PT [place]:  [place]  Status of Service:  Completed, signed off  If discussed at [place] of Stay Meetings, dates discussed:    Additional Comments:  Sharin Mons, RN 05/30/2018, 10:35 AM

## 2018-05-31 LAB — CULTURE, BLOOD (ROUTINE X 2)
Culture: NO GROWTH
Culture: NO GROWTH
Special Requests: ADEQUATE
Special Requests: ADEQUATE

## 2018-06-10 DIAGNOSIS — J449 Chronic obstructive pulmonary disease, unspecified: Secondary | ICD-10-CM | POA: Diagnosis not present

## 2018-06-10 DIAGNOSIS — M810 Age-related osteoporosis without current pathological fracture: Secondary | ICD-10-CM | POA: Diagnosis not present

## 2018-06-10 DIAGNOSIS — Z0000 Encounter for general adult medical examination without abnormal findings: Secondary | ICD-10-CM | POA: Diagnosis not present

## 2018-06-10 DIAGNOSIS — J189 Pneumonia, unspecified organism: Secondary | ICD-10-CM | POA: Diagnosis not present

## 2018-06-10 DIAGNOSIS — F324 Major depressive disorder, single episode, in partial remission: Secondary | ICD-10-CM | POA: Diagnosis not present

## 2018-06-10 DIAGNOSIS — Z720 Tobacco use: Secondary | ICD-10-CM | POA: Diagnosis not present

## 2018-06-10 DIAGNOSIS — Z789 Other specified health status: Secondary | ICD-10-CM | POA: Diagnosis not present

## 2018-06-10 DIAGNOSIS — E43 Unspecified severe protein-calorie malnutrition: Secondary | ICD-10-CM | POA: Diagnosis not present

## 2018-06-10 DIAGNOSIS — K219 Gastro-esophageal reflux disease without esophagitis: Secondary | ICD-10-CM | POA: Diagnosis not present

## 2018-06-10 DIAGNOSIS — Z1389 Encounter for screening for other disorder: Secondary | ICD-10-CM | POA: Diagnosis not present

## 2018-10-16 DIAGNOSIS — F324 Major depressive disorder, single episode, in partial remission: Secondary | ICD-10-CM | POA: Diagnosis not present

## 2018-10-16 DIAGNOSIS — R609 Edema, unspecified: Secondary | ICD-10-CM | POA: Diagnosis not present

## 2018-10-16 DIAGNOSIS — E440 Moderate protein-calorie malnutrition: Secondary | ICD-10-CM | POA: Diagnosis not present

## 2018-10-31 ENCOUNTER — Encounter (HOSPITAL_COMMUNITY): Payer: Self-pay | Admitting: *Deleted

## 2018-10-31 ENCOUNTER — Emergency Department (HOSPITAL_COMMUNITY): Payer: Medicare Other

## 2018-10-31 ENCOUNTER — Emergency Department (HOSPITAL_COMMUNITY)
Admission: EM | Admit: 2018-10-31 | Discharge: 2018-10-31 | Disposition: A | Discharge status: Home / Self Care | Payer: Medicare Other | Attending: Emergency Medicine | Admitting: Emergency Medicine

## 2018-10-31 ENCOUNTER — Other Ambulatory Visit: Payer: Self-pay

## 2018-10-31 DIAGNOSIS — Z8509 Personal history of malignant neoplasm of other digestive organs: Secondary | ICD-10-CM | POA: Insufficient documentation

## 2018-10-31 DIAGNOSIS — Z79899 Other long term (current) drug therapy: Secondary | ICD-10-CM | POA: Diagnosis not present

## 2018-10-31 DIAGNOSIS — M546 Pain in thoracic spine: Secondary | ICD-10-CM | POA: Diagnosis not present

## 2018-10-31 DIAGNOSIS — M545 Low back pain: Secondary | ICD-10-CM | POA: Insufficient documentation

## 2018-10-31 DIAGNOSIS — F17210 Nicotine dependence, cigarettes, uncomplicated: Secondary | ICD-10-CM | POA: Insufficient documentation

## 2018-10-31 DIAGNOSIS — M549 Dorsalgia, unspecified: Secondary | ICD-10-CM | POA: Diagnosis not present

## 2018-10-31 DIAGNOSIS — J449 Chronic obstructive pulmonary disease, unspecified: Secondary | ICD-10-CM | POA: Diagnosis not present

## 2018-10-31 LAB — URINALYSIS, ROUTINE W REFLEX MICROSCOPIC
Bilirubin Urine: NEGATIVE
Glucose, UA: NEGATIVE mg/dL
Hgb urine dipstick: NEGATIVE
Ketones, ur: NEGATIVE mg/dL
Leukocytes,Ua: NEGATIVE
Nitrite: NEGATIVE
Protein, ur: NEGATIVE mg/dL
Specific Gravity, Urine: 1.005 (ref 1.005–1.030)
pH: 6 (ref 5.0–8.0)

## 2018-10-31 LAB — BASIC METABOLIC PANEL
Anion gap: 9 (ref 5–15)
BUN: 8 mg/dL (ref 8–23)
CO2: 27 mmol/L (ref 22–32)
Calcium: 9.3 mg/dL (ref 8.9–10.3)
Chloride: 101 mmol/L (ref 98–111)
Creatinine, Ser: 0.86 mg/dL (ref 0.44–1.00)
GFR calc Af Amer: >60 mL/min (ref >60)
GFR calc non Af Amer: >60 mL/min (ref >60)
Glucose, Bld: 107 mg/dL — ABNORMAL HIGH (ref 70–99)
Potassium: 4 mmol/L (ref 3.5–5.1)
Sodium: 137 mmol/L (ref 135–145)

## 2018-10-31 LAB — CBC WITH DIFFERENTIAL/PLATELET
Abs Immature Granulocytes: 0.04 10*3/uL (ref 0.00–0.07)
Basophils Absolute: 0.1 10*3/uL (ref 0.0–0.1)
Basophils Relative: 1 %
Eosinophils Absolute: 0 10*3/uL (ref 0.0–0.5)
Eosinophils Relative: 0 %
HCT: 36.7 % (ref 36.0–46.0)
Hemoglobin: 11.8 g/dL — ABNORMAL LOW (ref 12.0–15.0)
Immature Granulocytes: 1 %
Lymphocytes Relative: 18 %
Lymphs Abs: 1.6 10*3/uL (ref 0.7–4.0)
MCH: 29.8 pg (ref 26.0–34.0)
MCHC: 32.2 g/dL (ref 30.0–36.0)
MCV: 92.7 fL (ref 80.0–100.0)
Monocytes Absolute: 0.6 10*3/uL (ref 0.1–1.0)
Monocytes Relative: 7 %
Neutro Abs: 6.5 10*3/uL (ref 1.7–7.7)
Neutrophils Relative %: 73 %
Platelets: 163 10*3/uL (ref 150–400)
RBC: 3.96 MIL/uL (ref 3.87–5.11)
RDW: 12.9 % (ref 11.5–15.5)
WBC: 8.8 10*3/uL (ref 4.0–10.5)
nRBC: 0 % (ref 0.0–0.2)

## 2018-10-31 MED ORDER — TRAMADOL HCL 50 MG PO TABS: 50.0000 mg | ORAL_TABLET | Freq: Four times a day (QID) | ORAL | 0 refills | Status: AC | PRN

## 2018-10-31 MED ORDER — FENTANYL CITRATE (PF) 100 MCG/2ML IJ SOLN
50.0000 ug | Freq: Once | INTRAMUSCULAR | Status: AC
  Administered 2018-10-31: 50 ug via INTRAVENOUS
  Filled 2018-10-31: qty 2

## 2018-10-31 MED ORDER — OXYCODONE-ACETAMINOPHEN 5-325 MG PO TABS
1.0000 | ORAL_TABLET | Freq: Once | ORAL | Status: AC
  Administered 2018-10-31: 1 via ORAL
  Filled 2018-10-31: qty 1

## 2018-10-31 NOTE — Discharge Instructions (Signed)
You were seen in the emergency department for

## 2018-10-31 NOTE — ED Notes (Signed)
Patient transported to x-ray. ?

## 2018-10-31 NOTE — ED Triage Notes (Signed)
Pt reports waking up with severe thoracic pain this am.  Denies injury or flank pain.  Reports hx of back pain in the past where she had "cement" injected in her spine.

## 2018-10-31 NOTE — ED Provider Notes (Signed)
[facility] EMERGENCY DEPARTMENT Provider Note   CSN: [id number] Arrival date & time: 10/31/18  1553    History   Chief Complaint Chief Complaint  Patient presents with   Back Pain    HPI Mackenzie Mendoza is a 85 y.o. female.  She is complaining of severe lower thoracic back pain that started this morning when she woke up.  There was no injury.  She denies any chest pain or abdominal pain.  No urinary symptoms.  She has a history of thoracic and lumbar compression fractures and has had procedures for that 10 years ago.  She says this feels similar to that.  She usually is independent and does not use a walker or a cane but has had to use a walker today due to her her pain.  She tried Tylenol without any improvement.     The history is provided by the patient.  Back Pain  Location:  Thoracic spine and lumbar spine Quality:  Stabbing Radiates to:  Does not radiate Pain severity:  Severe Pain is:  Same all the time Onset quality:  Sudden Duration:  8 hours Timing:  Constant Progression:  Unchanged Chronicity:  Recurrent Context: not falling, not MCA, not MVA, not recent illness and not recent injury   Relieved by:  Nothing Worsened by:  Ambulation, bending and movement Ineffective treatments:  OTC medications Associated symptoms: no abdominal pain, no bladder incontinence, no bowel incontinence, no chest pain, no dysuria, no fever, no leg pain, no numbness and no weakness   Risk factors: hx of cancer and hx of osteoporosis     Past Medical History:  Diagnosis Date   Cancer [facility])     Patient Active Problem List   Diagnosis Date Noted   HCAP (healthcare-associated pneumonia) 05/26/2018   COPD (chronic obstructive pulmonary disease) ([place]) 03/30/2018   Anxiety 03/30/2018   Multifocal pneumonia 03/29/2018   Community acquired pneumonia 10/13/2017   Leucocytosis 10/13/2017   Pneumonia 10/13/2017   Dehydration    Protein-calorie malnutrition,  severe ([place])    Sepsis ([place]) 05/27/2017   Lobar pneumonia ([place]) 05/27/2017   Nausea & vomiting 05/27/2017   Hypokalemia 05/27/2017   GERD (gastroesophageal reflux disease) 05/27/2017   Abdominal pain 08/09/2016   Depression 08/09/2016   Acute respiratory failure with hypoxia ([place]) 08/09/2016   Weight loss 03/05/2012   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  Neoadj chemoXRT, [place] 2007-2008     OB History   No obstetric history on file.      Home Medications    Prior to Admission medications   Medication Sig Start Date End Date Taking? Authorizing Provider  acetaminophen (TYLENOL) 500 MG tablet Take 1 tablet (500 mg total) by mouth every 6 (six) hours as needed for moderate pain or headache. Patient taking differently: Take 1,000 mg by mouth every 6 (six) hours as needed for moderate pain or headache.  08/14/16   Regalado, Belkys A, MD  albuterol (PROVENTIL HFA;VENTOLIN HFA) 108 (90 Base) MCG/ACT inhaler Inhale 1-2 puffs into the lungs every 6 (six) hours as needed for wheezing or shortness of breath. 03/31/18 05/26/26  Debbe Odea, MD  cholecalciferol (VITAMIN D) 1000 units tablet Take 2,000 Units by mouth  3 (three) times a week.     [provider]  Cyanocobalamin (VITAMIN B-12 PO) Take 1 tablet by mouth daily.    [provider]  dextromethorphan-guaiFENesin (MUCINEX DM) 30-600 MG 12hr tablet Take 1 tablet by mouth 2 (two) times daily. Patient not taking: Reported on 05/26/2018 05/30/17   Debbe Odea, MD  feeding supplement (BOOST / RESOURCE BREEZE) LIQD Take 1 Container by mouth 3 (three) times daily between meals. Patient not taking: Reported on 05/26/2018 08/14/16   Regalado, Jerald Kief  A, MD  ferrous sulfate 325 (65 FE) MG tablet Take 325 mg by mouth every other day.     [provider]  LORazepam (ATIVAN) 1 MG tablet Take 0.5-1 mg by mouth 2 (two) times daily as needed for anxiety.     [provider]  pantoprazole (PROTONIX) 40 MG tablet Take 1 tablet (40 mg total) by mouth 2 (two) times daily before a meal. Patient taking differently: Take 40 mg by mouth daily.  08/14/16   Regalado, Jerald Kief A, MD  Probiotic Product (PROBIOTIC PO) Take 1 capsule by mouth daily.     [provider]    Family History Family History  Problem Relation Age of Onset   Lung cancer Brother    Breast cancer Sister     Social History Social History   Tobacco Use   Smoking status: Current Every Day Smoker    Packs/day: 1.00    Types: Cigarettes   Smokeless tobacco: Never Used  Substance Use Topics   Alcohol use: No   Drug use: No     Allergies   Adhesive [tape]; Aspirin; Codeine-promethazine [promethazine-codeine]; Estrogens; and Phenobarbital   Review of Systems Review of Systems  Constitutional: Negative for fever.  HENT: Negative for sore throat.   Eyes: Negative for visual disturbance.  Respiratory: Negative for shortness of breath.   Cardiovascular: Negative for chest pain.  Gastrointestinal: Negative for abdominal pain and bowel incontinence.  Genitourinary: Negative for bladder incontinence and dysuria.  Musculoskeletal: Positive for back pain. Negative for neck pain.  Skin: Negative for rash.  Neurological: Negative for weakness and numbness.     Physical Exam Updated Vital Signs BP 132/65 (BP Location: Left Arm)    Pulse 78    Temp 98.3 F (36.8 C) (Oral)    Resp 16    SpO2 98%   Physical Exam Vitals signs and nursing note reviewed.  Constitutional:      General: She is not in acute distress.    Appearance: She is well-developed. She is cachectic.  HENT:     Head: Normocephalic and atraumatic.  Eyes:     Conjunctiva/sclera:  Conjunctivae normal.  Neck:     Musculoskeletal: Neck supple.  Cardiovascular:     Rate and Rhythm: Normal rate and regular rhythm.     Pulses: Normal pulses.     Heart sounds: No murmur.  Pulmonary:     Effort: Pulmonary effort is normal. No respiratory distress.     Breath sounds: Normal breath sounds.  Abdominal:     Palpations: Abdomen is soft.     Tenderness: There is no abdominal tenderness.  Musculoskeletal:     Thoracic back: She exhibits tenderness, bony tenderness and pain. She exhibits no edema.     Lumbar back: She exhibits tenderness, bony tenderness and pain.     Right lower leg: No edema.     Left lower leg: No edema.     Comments: She is generally cachectic and kyphotic.  She has diffuse lower thoracic and lumbar spine tenderness.  There is no overlying erythema edema or ecchymosis.  Skin:    General: Skin is warm and dry.     Capillary Refill: Capillary refill takes less than 2 seconds.  Neurological:     General: No focal deficit present.     Mental Status: She is alert.     Comments: She is awake and alert.  She is moving all her extremities.  She was stable on her feet but moving very slowly due to her back pain.      ED Treatments / Results  Labs (all labs ordered are listed, but only abnormal results are displayed) Labs Reviewed  BASIC METABOLIC PANEL - Abnormal; Notable for the following components:      Result Value   Glucose, Bld 107 (*)    All other components within normal limits  CBC WITH DIFFERENTIAL/PLATELET - Abnormal; Notable for the following components:   Hemoglobin 11.8 (*)    All other components within normal limits  URINALYSIS, ROUTINE W REFLEX MICROSCOPIC - Abnormal; Notable for the following components:   Color, Urine STRAW (*)    All other components within normal limits    EKG EKG Interpretation  Date/Time:  Thursday October 31 2018 17:18:29 EDT Ventricular Rate:  77 PR Interval:    QRS Duration: 88 QT Interval:  450 QTC  Calculation: 503 R Axis:   44 Text Interpretation:  Sinus rhythm Atrial premature complex Probable left atrial enlargement Abnormal R-wave progression, early transition Nonspecific T abnormalities, lateral leads Prolonged QT interval No significant change since 12/19 Confirmed by Aletta Edouard [id number]) on 10/31/2018 5:39:41 PM   Radiology Dg Chest 2 View  Result Date: 10/31/2018 CLINICAL DATA:  Chest pain. EXAM: CHEST - 2 VIEW COMPARISON:  Chest x-ray dated May 26, 2018. FINDINGS: The heart size and mediastinal contours are within normal limits. Atherosclerotic calcification of the aortic arch. Normal pulmonary vascularity. Chronically coarsened interstitial markings are similar to prior study and likely smoking-related. The lungs remain hyperinflated. No focal consolidation, pleural effusion, or pneumothorax. Suspected new T7 central height loss when compared to prior x-ray. Chronic lower thoracic and upper lumbar compression deformity status post cement augmentation are unchanged. Unchanged large hiatal hernia. IMPRESSION: 1. Possible new mild T7 compression fracture. Correlate with point tenderness. 2. COPD. Electronically Signed   By: Titus Dubin M.D.   On: 10/31/2018 18:27   Ct Thoracic Spine Wo Contrast  Result Date: 10/31/2018 CLINICAL DATA:  Severe back pain since this morning EXAM: CT THORACIC AND LUMBAR SPINE WITHOUT CONTRAST TECHNIQUE: Multidetector CT imaging of the thoracic and lumbar spine was performed without contrast. Multiplanar CT image reconstructions were also generated. COMPARISON:  CT abdomen pelvis 05/28/2017 FINDINGS: CT THORACIC SPINE FINDINGS Alignment: Dextroscoliosis with apex at T6. Straightening of normal thoracic kyphosis. Vertebrae: Vertebra plana of T12 with augmentation cement, unchanged. Sclerotic lesion within T1 large Schmorl's node at the superior endplate of T7. No acute compression fracture of the thoracic spine. Paraspinal and other soft tissues: There is  calcific aortic atherosclerosis. Large hiatal hernia. There is bilateral upper lobe dependent scarring or atelectasis. Disc levels: No bony spinal canal stenosis or visible large disc herniation. CT LUMBAR SPINE FINDINGS Segmentation: 5 lumbar type vertebrae. Alignment: Normal. Vertebrae: Unchanged appearance of L2 compression fracture with augmentation material. Chronic compression deformity of L3 is also unchanged. There is no acute compression fracture. Paraspinal and other soft tissues: Calcific aortic atherosclerosis Disc levels: No spinal canal stenosis or neural  impingement. No large disc herniation. IMPRESSION: CT THORACIC SPINE IMPRESSION 1. No acute fracture or static subluxation. 2. Chronic T12 compression deformity with post augmentation changes. 3. Bilateral upper lobe scarring or atelectasis. 4. No thoracic spine spinal canal or neural foraminal stenosis. CT LUMBAR SPINE IMPRESSION 1. No acute fracture or static subluxation. 2. Unchanged appearance of L2 and L3 compression fractures, which are chronic. The L2 fractures status post augmentation. 3. No lumbar spinal canal or neural foraminal stenosis. 4.  Aortic atherosclerosis (ICD10-I70.0). Electronically Signed   By: Ulyses Jarred M.D.   On: 10/31/2018 19:40   Ct Lumbar Spine Wo Contrast  Result Date: 10/31/2018 CLINICAL DATA:  Severe back pain since this morning EXAM: CT THORACIC AND LUMBAR SPINE WITHOUT CONTRAST TECHNIQUE: Multidetector CT imaging of the thoracic and lumbar spine was performed without contrast. Multiplanar CT image reconstructions were also generated. COMPARISON:  CT abdomen pelvis 05/28/2017 FINDINGS: CT THORACIC SPINE FINDINGS Alignment: Dextroscoliosis with apex at T6. Straightening of normal thoracic kyphosis. Vertebrae: Vertebra plana of T12 with augmentation cement, unchanged. Sclerotic lesion within T1 large Schmorl's node at the superior endplate of T7. No acute compression fracture of the thoracic spine. Paraspinal and  other soft tissues: There is calcific aortic atherosclerosis. Large hiatal hernia. There is bilateral upper lobe dependent scarring or atelectasis. Disc levels: No bony spinal canal stenosis or visible large disc herniation. CT LUMBAR SPINE FINDINGS Segmentation: 5 lumbar type vertebrae. Alignment: Normal. Vertebrae: Unchanged appearance of L2 compression fracture with augmentation material. Chronic compression deformity of L3 is also unchanged. There is no acute compression fracture. Paraspinal and other soft tissues: Calcific aortic atherosclerosis Disc levels: No spinal canal stenosis or neural impingement. No large disc herniation. IMPRESSION: CT THORACIC SPINE IMPRESSION 1. No acute fracture or static subluxation. 2. Chronic T12 compression deformity with post augmentation changes. 3. Bilateral upper lobe scarring or atelectasis. 4. No thoracic spine spinal canal or neural foraminal stenosis. CT LUMBAR SPINE IMPRESSION 1. No acute fracture or static subluxation. 2. Unchanged appearance of L2 and L3 compression fractures, which are chronic. The L2 fractures status post augmentation. 3. No lumbar spinal canal or neural foraminal stenosis. 4.  Aortic atherosclerosis (ICD10-I70.0). Electronically Signed   By: Ulyses Jarred M.D.   On: 10/31/2018 19:40    Procedures Procedures (including critical care time)  Medications Ordered in ED Medications  fentaNYL (SUBLIMAZE) injection 50 mcg (50 mcg Intravenous Given 10/31/18 1737)  oxyCODONE-acetaminophen (PERCOCET/ROXICET) 5-325 MG per tablet 1 tablet (1 tablet Oral Given 10/31/18 2034)     Initial Impression / Assessment and Plan / ED Course  I have reviewed the triage vital signs and the nursing notes.  Pertinent labs & imaging results that were available during my care of the patient were reviewed by me and considered in my medical decision making (see chart for details).  Clinical Course as of Nov 01 1030  Thu Oct 31, 5827  [id number] 85 year old female  cachectic here with atraumatic lower thoracic and lumbar back pain since this morning.  History of compression fractures requiring kyphoplasty.  Neuro intact afebrile.  Have ordered some screening labs EKG chest x-ray but I think ultimately she will need a thoracic and lumbar CT.   [MB]  2006 Patient CT show her compression fractures but these appear stable from prior imaging.  I reviewed this with patient.  We will send her home with some tramadol which she has been on before for pain.  Also get a put in a home health consult   [  MB]    Clinical Course User Index [MB] Hayden Rasmussen, MD         Final Clinical Impressions(s) / ED Diagnoses   Final diagnoses:  Acute bilateral thoracic back pain    ED Discharge Orders         Ordered    traMADol (ULTRAM) 50 MG tablet  Every 6 hours PRN     10/31/18 2008           Hayden Rasmussen, MD 11/01/18 1032

## 2018-11-11 ENCOUNTER — Encounter (HOSPITAL_COMMUNITY): Payer: Self-pay | Admitting: *Deleted

## 2018-11-11 ENCOUNTER — Other Ambulatory Visit: Payer: Self-pay

## 2018-11-11 ENCOUNTER — Emergency Department (HOSPITAL_COMMUNITY)
Admission: EM | Admit: 2018-11-11 | Discharge: 2018-11-12 | Disposition: A | Discharge status: Home / Self Care | Payer: Medicare Other | Attending: Emergency Medicine | Admitting: Emergency Medicine

## 2018-11-11 DIAGNOSIS — Z79899 Other long term (current) drug therapy: Secondary | ICD-10-CM | POA: Diagnosis not present

## 2018-11-11 DIAGNOSIS — F17210 Nicotine dependence, cigarettes, uncomplicated: Secondary | ICD-10-CM | POA: Diagnosis not present

## 2018-11-11 DIAGNOSIS — J449 Chronic obstructive pulmonary disease, unspecified: Secondary | ICD-10-CM | POA: Insufficient documentation

## 2018-11-11 DIAGNOSIS — K5900 Constipation, unspecified: Secondary | ICD-10-CM | POA: Diagnosis not present

## 2018-11-11 DIAGNOSIS — K449 Diaphragmatic hernia without obstruction or gangrene: Secondary | ICD-10-CM | POA: Diagnosis not present

## 2018-11-11 LAB — COMPREHENSIVE METABOLIC PANEL
ALT: 15 U/L (ref 0–44)
AST: 22 U/L (ref 15–41)
Albumin: 3.6 g/dL (ref 3.5–5.0)
Alkaline Phosphatase: 95 U/L (ref 38–126)
Anion gap: 12 (ref 5–15)
BUN: 14 mg/dL (ref 8–23)
CO2: 28 mmol/L (ref 22–32)
Calcium: 9.5 mg/dL (ref 8.9–10.3)
Chloride: 97 mmol/L — ABNORMAL LOW (ref 98–111)
Creatinine, Ser: 0.97 mg/dL (ref 0.44–1.00)
GFR calc Af Amer: >60 mL/min (ref >60)
GFR calc non Af Amer: 54 mL/min — ABNORMAL LOW (ref >60)
Glucose, Bld: 124 mg/dL — ABNORMAL HIGH (ref 70–99)
Potassium: 4 mmol/L (ref 3.5–5.1)
Sodium: 137 mmol/L (ref 135–145)
Total Bilirubin: 0.5 mg/dL (ref 0.3–1.2)
Total Protein: 6.5 g/dL (ref 6.5–8.1)

## 2018-11-11 LAB — CBC
HCT: 34.6 % — ABNORMAL LOW (ref 36.0–46.0)
Hemoglobin: 11.5 g/dL — ABNORMAL LOW (ref 12.0–15.0)
MCH: 30.3 pg (ref 26.0–34.0)
MCHC: 33.2 g/dL (ref 30.0–36.0)
MCV: 91.1 fL (ref 80.0–100.0)
Platelets: 302 10*3/uL (ref 150–400)
RBC: 3.8 MIL/uL — ABNORMAL LOW (ref 3.87–5.11)
RDW: 13 % (ref 11.5–15.5)
WBC: 8.6 10*3/uL (ref 4.0–10.5)
nRBC: 0 % (ref 0.0–0.2)

## 2018-11-11 NOTE — ED Triage Notes (Signed)
Pt reports lower abdominal pain 2-3 days. Pt says she has not had a BM in "2-3 weeks."  Most recently she has had 1 enema last night and 2 today. She reports she has also taken stool softer and laxatives without relief. No nausea or vomiting.

## 2018-11-11 NOTE — ED Notes (Signed)
Mackenzie Mendoza [phone] please call with updates and when pt is up for discharge

## 2018-11-11 NOTE — ED Provider Notes (Signed)
[facility] EMERGENCY DEPARTMENT Provider Note   CSN: [id number] Arrival date & time: 11/11/18  2053     History   Chief Complaint Chief Complaint  Patient presents with   Constipation    HPI Mackenzie Mendoza is a 85 y.o. female.     HPI   85 year old female with abdominal pain.  Lower abdomen.  Does not lateralize.  Onset about 2 to 3 days ago.  She has not noticed any appreciable exacerbating relieving factors.  No change after eating.  No acute urinary complaints.  She states that she did not have a good bowel movement and 2 to 3 weeks.  Only able to pass a small amount of stool.  Has tried enemas, laxatives and stool softener some much improvement.  She states that the back pain she was having earlier in the month is still present although improved from the time of her last ED evaluation on 11/01/2018.  She is still taking tramadol as needed for pain.  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     Patient Active Problem List   Diagnosis Date Noted   HCAP (healthcare-associated pneumonia) 05/26/2018   COPD (chronic obstructive pulmonary disease) ([place]) 03/30/2018   Anxiety 03/30/2018   Multifocal pneumonia 03/29/2018   Community acquired pneumonia 10/13/2017   Leucocytosis 10/13/2017   Pneumonia 10/13/2017   Dehydration    Protein-calorie malnutrition, severe ([place])    Sepsis ([place]) 05/27/2017   Lobar pneumonia ([place]) 05/27/2017   Nausea & vomiting 05/27/2017   Hypokalemia 05/27/2017   GERD (gastroesophageal reflux disease) 05/27/2017   Abdominal pain 08/09/2016   Depression 08/09/2016   Acute respiratory failure with hypoxia ([place]) 08/09/2016   Weight loss 03/05/2012   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  Neoadj chemoXRT, Whipple 2007-2008     OB History   No obstetric history on file.      Home Medications    Prior to Admission medications   Medication Sig Start Date End Date Taking? Authorizing Provider  acetaminophen (TYLENOL) 500 MG tablet Take 1 tablet (500 mg total) by mouth every 6 (six) hours as needed for moderate pain or headache. Patient taking differently: Take 1,000 mg by mouth every 6 (six) hours as needed for moderate pain or headache.  08/14/16   Regalado, Belkys A, MD  albuterol (PROVENTIL HFA;VENTOLIN HFA) 108 (90 Base) MCG/ACT inhaler Inhale 1-2 puffs into the lungs every 6 (six) hours as needed for wheezing or shortness of breath. 03/31/18 05/26/26  Debbe Odea, MD  cholecalciferol (VITAMIN D) 1000 units tablet Take 2,000 Units by mouth 3 (three) times a week.     [provider]  Cyanocobalamin (VITAMIN B-12 PO) Take 1 tablet by mouth daily.    [provider]  dextromethorphan-guaiFENesin (MUCINEX DM) 30-600 MG 12hr tablet Take 1 tablet by mouth 2 (two) times daily. Patient not taking: Reported on 05/26/2018 05/30/17   Debbe Odea, MD  feeding supplement (BOOST / RESOURCE BREEZE) LIQD Take 1 Container by mouth 3 (three) times daily between meals. Patient not taking: Reported on 05/26/2018 08/14/16   Regalado, Jerald Kief A, MD  ferrous sulfate 325 (65 FE) MG tablet Take 325  mg by mouth every other day.     [provider]  LORazepam (ATIVAN) 1 MG tablet Take 0.5-1 mg by mouth 2 (two) times daily as needed for anxiety.     [provider]  pantoprazole (PROTONIX) 40 MG tablet Take 1 tablet (40 mg total) by mouth 2 (two) times daily before a meal. Patient taking differently: Take 40 mg by mouth daily.  08/14/16   Regalado, Jerald Kief A, MD  Probiotic Product (PROBIOTIC PO) Take 1 capsule by mouth daily.     [provider]  traMADol (ULTRAM) 50 MG tablet Take 1  tablet (50 mg total) by mouth every 6 (six) hours as needed. 10/31/18   Hayden Rasmussen, MD    Family History Family History  Problem Relation Age of Onset   Lung cancer Brother    Breast cancer Sister     Social History Social History   Tobacco Use   Smoking status: Current Every Day Smoker    Packs/day: 1.00    Types: Cigarettes   Smokeless tobacco: Never Used  Substance Use Topics   Alcohol use: No   Drug use: No     Allergies   Adhesive [tape], Aspirin, Codeine-promethazine [promethazine-codeine], Estrogens, and Phenobarbital   Review of Systems Review of Systems  All systems reviewed and negative, other than as noted in HPI.  Physical Exam Updated Vital Signs BP 99/79 (BP Location: Left Arm)    Pulse 80    Temp 97.7 F (36.5 C) (Oral)    Resp 20    SpO2 98%   Physical Exam Vitals signs and nursing note reviewed.  Constitutional:      General: She is not in acute distress.    Appearance: She is well-developed.     Comments: Cachectic and chronically ill-appearing but not acutely distressed.  HENT:     Head: Normocephalic and atraumatic.  Eyes:     General:        Right eye: No discharge.        Left eye: No discharge.     Conjunctiva/sclera: Conjunctivae normal.  Neck:     Musculoskeletal: Neck supple.  Cardiovascular:     Rate and Rhythm: Normal rate and regular rhythm.     Heart sounds: Normal heart sounds. No murmur. No friction rub. No gallop.   Pulmonary:     Effort: Pulmonary effort is normal. No respiratory distress.     Breath sounds: Normal breath sounds.  Abdominal:     General: There is no distension.     Palpations: Abdomen is soft.     Tenderness: There is abdominal tenderness.     Comments: Mild tenderness to palpation across lower abdomen.  No rebound or guarding.  No mass appreciable.  Musculoskeletal:        General: No tenderness.  Skin:    General: Skin is warm and dry.  Neurological:     Mental Status: She is alert.    Psychiatric:        Behavior: Behavior normal.        Thought Content: Thought content normal.      ED Treatments / Results  Labs (all labs ordered are listed, but only abnormal results are displayed) Labs Reviewed  COMPREHENSIVE METABOLIC PANEL - Abnormal; Notable for the following components:      Result Value   Chloride 97 (*)    Glucose, Bld 124 (*)    GFR calc non Af Amer 54 (*)    All other components  within normal limits  CBC - Abnormal; Notable for the following components:   RBC 3.80 (*)    Hemoglobin 11.5 (*)    HCT 34.6 (*)    All other components within normal limits  URINALYSIS, ROUTINE W REFLEX MICROSCOPIC    EKG    Radiology No results found.   Dg Chest 2 View  Result Date: 10/31/2018 CLINICAL DATA:  Chest pain. EXAM: CHEST - 2 VIEW COMPARISON:  Chest x-ray dated May 26, 2018. FINDINGS: The heart size and mediastinal contours are within normal limits. Atherosclerotic calcification of the aortic arch. Normal pulmonary vascularity. Chronically coarsened interstitial markings are similar to prior study and likely smoking-related. The lungs remain hyperinflated. No focal consolidation, pleural effusion, or pneumothorax. Suspected new T7 central height loss when compared to prior x-ray. Chronic lower thoracic and upper lumbar compression deformity status post cement augmentation are unchanged. Unchanged large hiatal hernia. IMPRESSION: 1. Possible new mild T7 compression fracture. Correlate with point tenderness. 2. COPD. Electronically Signed   By: Titus Dubin M.D.   On: 10/31/2018 18:27   Ct Thoracic Spine Wo Contrast  Result Date: 10/31/2018 CLINICAL DATA:  Severe back pain since this morning EXAM: CT THORACIC AND LUMBAR SPINE WITHOUT CONTRAST TECHNIQUE: Multidetector CT imaging of the thoracic and lumbar spine was performed without contrast. Multiplanar CT image reconstructions were also generated. COMPARISON:  CT abdomen pelvis 05/28/2017 FINDINGS: CT  THORACIC SPINE FINDINGS Alignment: Dextroscoliosis with apex at T6. Straightening of normal thoracic kyphosis. Vertebrae: Vertebra plana of T12 with augmentation cement, unchanged. Sclerotic lesion within T1 large Schmorl's node at the superior endplate of T7. No acute compression fracture of the thoracic spine. Paraspinal and other soft tissues: There is calcific aortic atherosclerosis. Large hiatal hernia. There is bilateral upper lobe dependent scarring or atelectasis. Disc levels: No bony spinal canal stenosis or visible large disc herniation. CT LUMBAR SPINE FINDINGS Segmentation: 5 lumbar type vertebrae. Alignment: Normal. Vertebrae: Unchanged appearance of L2 compression fracture with augmentation material. Chronic compression deformity of L3 is also unchanged. There is no acute compression fracture. Paraspinal and other soft tissues: Calcific aortic atherosclerosis Disc levels: No spinal canal stenosis or neural impingement. No large disc herniation. IMPRESSION: CT THORACIC SPINE IMPRESSION 1. No acute fracture or static subluxation. 2. Chronic T12 compression deformity with post augmentation changes. 3. Bilateral upper lobe scarring or atelectasis. 4. No thoracic spine spinal canal or neural foraminal stenosis. CT LUMBAR SPINE IMPRESSION 1. No acute fracture or static subluxation. 2. Unchanged appearance of L2 and L3 compression fractures, which are chronic. The L2 fractures status post augmentation. 3. No lumbar spinal canal or neural foraminal stenosis. 4.  Aortic atherosclerosis (ICD10-I70.0). Electronically Signed   By: Ulyses Jarred M.D.   On: 10/31/2018 19:40   Ct Lumbar Spine Wo Contrast  Result Date: 10/31/2018 CLINICAL DATA:  Severe back pain since this morning EXAM: CT THORACIC AND LUMBAR SPINE WITHOUT CONTRAST TECHNIQUE: Multidetector CT imaging of the thoracic and lumbar spine was performed without contrast. Multiplanar CT image reconstructions were also generated. COMPARISON:  CT abdomen  pelvis 05/28/2017 FINDINGS: CT THORACIC SPINE FINDINGS Alignment: Dextroscoliosis with apex at T6. Straightening of normal thoracic kyphosis. Vertebrae: Vertebra plana of T12 with augmentation cement, unchanged. Sclerotic lesion within T1 large Schmorl's node at the superior endplate of T7. No acute compression fracture of the thoracic spine. Paraspinal and other soft tissues: There is calcific aortic atherosclerosis. Large hiatal hernia. There is bilateral upper lobe dependent scarring or atelectasis. Disc levels: No bony spinal canal  stenosis or visible large disc herniation. CT LUMBAR SPINE FINDINGS Segmentation: 5 lumbar type vertebrae. Alignment: Normal. Vertebrae: Unchanged appearance of L2 compression fracture with augmentation material. Chronic compression deformity of L3 is also unchanged. There is no acute compression fracture. Paraspinal and other soft tissues: Calcific aortic atherosclerosis Disc levels: No spinal canal stenosis or neural impingement. No large disc herniation. IMPRESSION: CT THORACIC SPINE IMPRESSION 1. No acute fracture or static subluxation. 2. Chronic T12 compression deformity with post augmentation changes. 3. Bilateral upper lobe scarring or atelectasis. 4. No thoracic spine spinal canal or neural foraminal stenosis. CT LUMBAR SPINE IMPRESSION 1. No acute fracture or static subluxation. 2. Unchanged appearance of L2 and L3 compression fractures, which are chronic. The L2 fractures status post augmentation. 3. No lumbar spinal canal or neural foraminal stenosis. 4.  Aortic atherosclerosis (ICD10-I70.0). Electronically Signed   By: Ulyses Jarred M.D.   On: 10/31/2018 19:40   Ct Abdomen Pelvis W Contrast  Result Date: 11/12/2018 CLINICAL DATA:  Lower abdominal pain.  Question rectovaginal fistula EXAM: CT ABDOMEN AND PELVIS WITH CONTRAST TECHNIQUE: Multidetector CT imaging of the abdomen and pelvis was performed using the standard protocol following bolus administration of  intravenous contrast. CONTRAST:  17mL OMNIPAQUE IOHEXOL 300 MG/ML  SOLN COMPARISON:  05/28/2017 FINDINGS: Lower chest: Large hiatal hernia.  No acute abnormality. Hepatobiliary: No focal liver abnormality is seen. Status post cholecystectomy. No biliary dilatation. Small amount of pneumobilia noted, stable. Pancreas: Prior Whipple procedure. Remaining pancreatic body unremarkable. Spleen: No focal abnormality.  Normal size. Adrenals/Urinary Tract: Large cyst off the upper pole of the right kidney is stable. No hydronephrosis. Adrenal glands and urinary bladder unremarkable. Stomach/Bowel: Large stool burden within the colon suggesting constipation. Postoperative changes from partial gastric resection related to Whipple procedure. No evidence of bowel obstruction. Vascular/Lymphatic: Aortic atherosclerosis. No enlarged abdominal or pelvic lymph nodes. Reproductive: Prior hysterectomy. No adnexal mass. No suspicious findings for rectovaginal fistula. Other: No free fluid or free air. Musculoskeletal: No acute bony abnormality. Prior T12 and L2 vertebroplasty. Stable moderate compression deformity at L3. IMPRESSION: Prior Whipple procedure. Large stool burden in the colon suggesting constipation. No evidence for rectovaginal fistula Large hiatal hernia. Aortic atherosclerosis. Electronically Signed   By: Rolm Baptise M.D.   On: 11/12/2018 01:12    Procedures Procedures (including critical care time)  Medications Ordered in ED Medications - No data to display   Initial Impression / Assessment and Plan / ED Course  I have reviewed the triage vital signs and the nursing notes.  Pertinent labs & imaging results that were available during my care of the patient were reviewed by me and considered in my medical decision making (see chart for details).    85 year old female with lower abdominal pain.  Mild tenderness on exam.  Given her advanced age, will CT.  Final Clinical Impressions(s) / ED Diagnoses    Final diagnoses:  Constipation, unspecified constipation type    ED Discharge Orders    None       Virgel Manifold, MD 11/13/18 [phone]

## 2018-11-12 ENCOUNTER — Encounter (HOSPITAL_COMMUNITY): Payer: Self-pay | Admitting: Radiology

## 2018-11-12 ENCOUNTER — Emergency Department (HOSPITAL_COMMUNITY): Payer: Medicare Other

## 2018-11-12 DIAGNOSIS — K449 Diaphragmatic hernia without obstruction or gangrene: Secondary | ICD-10-CM | POA: Diagnosis not present

## 2018-11-12 DIAGNOSIS — K5900 Constipation, unspecified: Secondary | ICD-10-CM | POA: Diagnosis not present

## 2018-11-12 MED ORDER — LACTULOSE 20 GM/30ML PO SOLN: 20.0000 g | Freq: Two times a day (BID) | ORAL | 0 refills | Status: AC | PRN

## 2018-11-12 MED ORDER — MAGNESIUM CITRATE PO SOLN: 1.0000 | Freq: Once | ORAL | 0 refills | Status: AC

## 2018-11-12 MED ORDER — IOHEXOL 300 MG/ML  SOLN
80.0000 mL | Freq: Once | INTRAMUSCULAR | Status: AC | PRN
  Administered 2018-11-12: 80 mL via INTRAVENOUS

## 2018-11-12 NOTE — ED Provider Notes (Signed)
Patient signed out to me by Dr. Wilson Singer to follow-up CT scan.  Patient seen for abdominal pain and sensation of constipation.  Due to her advanced age, CT scan was performed to evaluate for serious pathology.  CT scan consistent with constipation without any other abnormalities.  Patient appropriate for outpatient management of constipation.   Orpah Greek, MD 11/12/18 [phone]

## 2018-11-15 DIAGNOSIS — R102 Pelvic and perineal pain: Secondary | ICD-10-CM | POA: Diagnosis not present

## 2018-11-21 DIAGNOSIS — J449 Chronic obstructive pulmonary disease, unspecified: Secondary | ICD-10-CM | POA: Diagnosis not present

## 2018-11-21 DIAGNOSIS — Z8701 Personal history of pneumonia (recurrent): Secondary | ICD-10-CM | POA: Diagnosis not present

## 2018-11-21 DIAGNOSIS — F419 Anxiety disorder, unspecified: Secondary | ICD-10-CM | POA: Diagnosis not present

## 2018-11-21 DIAGNOSIS — Z9049 Acquired absence of other specified parts of digestive tract: Secondary | ICD-10-CM | POA: Diagnosis not present

## 2018-11-21 DIAGNOSIS — Z681 Body mass index (BMI) 19 or less, adult: Secondary | ICD-10-CM | POA: Diagnosis not present

## 2018-11-21 DIAGNOSIS — E46 Unspecified protein-calorie malnutrition: Secondary | ICD-10-CM | POA: Diagnosis not present

## 2018-11-21 DIAGNOSIS — K5900 Constipation, unspecified: Secondary | ICD-10-CM | POA: Diagnosis not present

## 2018-11-21 DIAGNOSIS — F17210 Nicotine dependence, cigarettes, uncomplicated: Secondary | ICD-10-CM | POA: Diagnosis not present

## 2018-11-21 DIAGNOSIS — Z8509 Personal history of malignant neoplasm of other digestive organs: Secondary | ICD-10-CM | POA: Diagnosis not present

## 2018-11-21 DIAGNOSIS — R630 Anorexia: Secondary | ICD-10-CM | POA: Diagnosis not present

## 2018-11-21 DIAGNOSIS — R634 Abnormal weight loss: Secondary | ICD-10-CM | POA: Diagnosis not present

## 2018-11-22 DIAGNOSIS — Z681 Body mass index (BMI) 19 or less, adult: Secondary | ICD-10-CM | POA: Diagnosis not present

## 2018-11-22 DIAGNOSIS — R630 Anorexia: Secondary | ICD-10-CM | POA: Diagnosis not present

## 2018-11-22 DIAGNOSIS — R634 Abnormal weight loss: Secondary | ICD-10-CM | POA: Diagnosis not present

## 2018-11-22 DIAGNOSIS — J449 Chronic obstructive pulmonary disease, unspecified: Secondary | ICD-10-CM | POA: Diagnosis not present

## 2018-11-22 DIAGNOSIS — E46 Unspecified protein-calorie malnutrition: Secondary | ICD-10-CM | POA: Diagnosis not present

## 2018-11-22 DIAGNOSIS — F419 Anxiety disorder, unspecified: Secondary | ICD-10-CM | POA: Diagnosis not present

## 2018-11-26 DIAGNOSIS — R634 Abnormal weight loss: Secondary | ICD-10-CM | POA: Diagnosis not present

## 2018-11-26 DIAGNOSIS — J449 Chronic obstructive pulmonary disease, unspecified: Secondary | ICD-10-CM | POA: Diagnosis not present

## 2018-11-26 DIAGNOSIS — E46 Unspecified protein-calorie malnutrition: Secondary | ICD-10-CM | POA: Diagnosis not present

## 2018-11-26 DIAGNOSIS — Z681 Body mass index (BMI) 19 or less, adult: Secondary | ICD-10-CM | POA: Diagnosis not present

## 2018-11-26 DIAGNOSIS — F419 Anxiety disorder, unspecified: Secondary | ICD-10-CM | POA: Diagnosis not present

## 2018-11-26 DIAGNOSIS — R630 Anorexia: Secondary | ICD-10-CM | POA: Diagnosis not present

## 2018-11-27 DIAGNOSIS — J449 Chronic obstructive pulmonary disease, unspecified: Secondary | ICD-10-CM | POA: Diagnosis not present

## 2018-11-27 DIAGNOSIS — Z9049 Acquired absence of other specified parts of digestive tract: Secondary | ICD-10-CM | POA: Diagnosis not present

## 2018-11-27 DIAGNOSIS — F17210 Nicotine dependence, cigarettes, uncomplicated: Secondary | ICD-10-CM | POA: Diagnosis not present

## 2018-11-27 DIAGNOSIS — F419 Anxiety disorder, unspecified: Secondary | ICD-10-CM | POA: Diagnosis not present

## 2018-11-27 DIAGNOSIS — Z681 Body mass index (BMI) 19 or less, adult: Secondary | ICD-10-CM | POA: Diagnosis not present

## 2018-11-27 DIAGNOSIS — K5900 Constipation, unspecified: Secondary | ICD-10-CM | POA: Diagnosis not present

## 2018-11-27 DIAGNOSIS — Z8509 Personal history of malignant neoplasm of other digestive organs: Secondary | ICD-10-CM | POA: Diagnosis not present

## 2018-11-27 DIAGNOSIS — R634 Abnormal weight loss: Secondary | ICD-10-CM | POA: Diagnosis not present

## 2018-11-27 DIAGNOSIS — E46 Unspecified protein-calorie malnutrition: Secondary | ICD-10-CM | POA: Diagnosis not present

## 2018-11-27 DIAGNOSIS — Z8701 Personal history of pneumonia (recurrent): Secondary | ICD-10-CM | POA: Diagnosis not present

## 2018-11-27 DIAGNOSIS — R630 Anorexia: Secondary | ICD-10-CM | POA: Diagnosis not present

## 2018-12-03 DIAGNOSIS — J449 Chronic obstructive pulmonary disease, unspecified: Secondary | ICD-10-CM | POA: Diagnosis not present

## 2018-12-03 DIAGNOSIS — R634 Abnormal weight loss: Secondary | ICD-10-CM | POA: Diagnosis not present

## 2018-12-03 DIAGNOSIS — E46 Unspecified protein-calorie malnutrition: Secondary | ICD-10-CM | POA: Diagnosis not present

## 2018-12-03 DIAGNOSIS — F419 Anxiety disorder, unspecified: Secondary | ICD-10-CM | POA: Diagnosis not present

## 2018-12-03 DIAGNOSIS — R630 Anorexia: Secondary | ICD-10-CM | POA: Diagnosis not present

## 2018-12-03 DIAGNOSIS — Z681 Body mass index (BMI) 19 or less, adult: Secondary | ICD-10-CM | POA: Diagnosis not present

## 2018-12-04 DIAGNOSIS — F419 Anxiety disorder, unspecified: Secondary | ICD-10-CM | POA: Diagnosis not present

## 2018-12-04 DIAGNOSIS — Z681 Body mass index (BMI) 19 or less, adult: Secondary | ICD-10-CM | POA: Diagnosis not present

## 2018-12-04 DIAGNOSIS — J449 Chronic obstructive pulmonary disease, unspecified: Secondary | ICD-10-CM | POA: Diagnosis not present

## 2018-12-04 DIAGNOSIS — R634 Abnormal weight loss: Secondary | ICD-10-CM | POA: Diagnosis not present

## 2018-12-04 DIAGNOSIS — R630 Anorexia: Secondary | ICD-10-CM | POA: Diagnosis not present

## 2018-12-04 DIAGNOSIS — E46 Unspecified protein-calorie malnutrition: Secondary | ICD-10-CM | POA: Diagnosis not present

## 2018-12-05 DIAGNOSIS — J449 Chronic obstructive pulmonary disease, unspecified: Secondary | ICD-10-CM | POA: Diagnosis not present

## 2018-12-05 DIAGNOSIS — R630 Anorexia: Secondary | ICD-10-CM | POA: Diagnosis not present

## 2018-12-05 DIAGNOSIS — Z681 Body mass index (BMI) 19 or less, adult: Secondary | ICD-10-CM | POA: Diagnosis not present

## 2018-12-05 DIAGNOSIS — F419 Anxiety disorder, unspecified: Secondary | ICD-10-CM | POA: Diagnosis not present

## 2018-12-05 DIAGNOSIS — R634 Abnormal weight loss: Secondary | ICD-10-CM | POA: Diagnosis not present

## 2018-12-05 DIAGNOSIS — E46 Unspecified protein-calorie malnutrition: Secondary | ICD-10-CM | POA: Diagnosis not present

## 2018-12-06 DIAGNOSIS — E46 Unspecified protein-calorie malnutrition: Secondary | ICD-10-CM | POA: Diagnosis not present

## 2018-12-06 DIAGNOSIS — F419 Anxiety disorder, unspecified: Secondary | ICD-10-CM | POA: Diagnosis not present

## 2018-12-06 DIAGNOSIS — R634 Abnormal weight loss: Secondary | ICD-10-CM | POA: Diagnosis not present

## 2018-12-06 DIAGNOSIS — R630 Anorexia: Secondary | ICD-10-CM | POA: Diagnosis not present

## 2018-12-06 DIAGNOSIS — J449 Chronic obstructive pulmonary disease, unspecified: Secondary | ICD-10-CM | POA: Diagnosis not present

## 2018-12-06 DIAGNOSIS — Z681 Body mass index (BMI) 19 or less, adult: Secondary | ICD-10-CM | POA: Diagnosis not present

## 2018-12-09 DIAGNOSIS — R630 Anorexia: Secondary | ICD-10-CM | POA: Diagnosis not present

## 2018-12-09 DIAGNOSIS — E46 Unspecified protein-calorie malnutrition: Secondary | ICD-10-CM | POA: Diagnosis not present

## 2018-12-09 DIAGNOSIS — R634 Abnormal weight loss: Secondary | ICD-10-CM | POA: Diagnosis not present

## 2018-12-09 DIAGNOSIS — J449 Chronic obstructive pulmonary disease, unspecified: Secondary | ICD-10-CM | POA: Diagnosis not present

## 2018-12-09 DIAGNOSIS — Z681 Body mass index (BMI) 19 or less, adult: Secondary | ICD-10-CM | POA: Diagnosis not present

## 2018-12-09 DIAGNOSIS — F419 Anxiety disorder, unspecified: Secondary | ICD-10-CM | POA: Diagnosis not present

## 2018-12-10 DIAGNOSIS — E46 Unspecified protein-calorie malnutrition: Secondary | ICD-10-CM | POA: Diagnosis not present

## 2018-12-10 DIAGNOSIS — R630 Anorexia: Secondary | ICD-10-CM | POA: Diagnosis not present

## 2018-12-10 DIAGNOSIS — R634 Abnormal weight loss: Secondary | ICD-10-CM | POA: Diagnosis not present

## 2018-12-10 DIAGNOSIS — F419 Anxiety disorder, unspecified: Secondary | ICD-10-CM | POA: Diagnosis not present

## 2018-12-10 DIAGNOSIS — Z681 Body mass index (BMI) 19 or less, adult: Secondary | ICD-10-CM | POA: Diagnosis not present

## 2018-12-10 DIAGNOSIS — J449 Chronic obstructive pulmonary disease, unspecified: Secondary | ICD-10-CM | POA: Diagnosis not present

## 2018-12-12 DIAGNOSIS — E46 Unspecified protein-calorie malnutrition: Secondary | ICD-10-CM | POA: Diagnosis not present

## 2018-12-12 DIAGNOSIS — Z681 Body mass index (BMI) 19 or less, adult: Secondary | ICD-10-CM | POA: Diagnosis not present

## 2018-12-12 DIAGNOSIS — R630 Anorexia: Secondary | ICD-10-CM | POA: Diagnosis not present

## 2018-12-12 DIAGNOSIS — F419 Anxiety disorder, unspecified: Secondary | ICD-10-CM | POA: Diagnosis not present

## 2018-12-12 DIAGNOSIS — R634 Abnormal weight loss: Secondary | ICD-10-CM | POA: Diagnosis not present

## 2018-12-12 DIAGNOSIS — J449 Chronic obstructive pulmonary disease, unspecified: Secondary | ICD-10-CM | POA: Diagnosis not present

## 2018-12-28 DEATH — deceased
